# Patient Record
Sex: Male | Born: 1945 | Race: Black or African American | Hispanic: No | Marital: Married | Smoking: Never smoker
Health system: Southern US, Community
[De-identification: ages and names within clinical notes are randomized; demographics above are authoritative.]

## PROBLEM LIST (undated history)

## (undated) DIAGNOSIS — F439 Reaction to severe stress, unspecified: Secondary | ICD-10-CM

## (undated) DIAGNOSIS — H269 Unspecified cataract: Secondary | ICD-10-CM

## (undated) DIAGNOSIS — R252 Cramp and spasm: Secondary | ICD-10-CM

## (undated) DIAGNOSIS — K219 Gastro-esophageal reflux disease without esophagitis: Secondary | ICD-10-CM

## (undated) DIAGNOSIS — R0602 Shortness of breath: Secondary | ICD-10-CM

## (undated) DIAGNOSIS — F329 Major depressive disorder, single episode, unspecified: Secondary | ICD-10-CM

## (undated) DIAGNOSIS — H9190 Unspecified hearing loss, unspecified ear: Secondary | ICD-10-CM

## (undated) DIAGNOSIS — F32A Depression, unspecified: Secondary | ICD-10-CM

## (undated) DIAGNOSIS — R0981 Nasal congestion: Secondary | ICD-10-CM

## (undated) DIAGNOSIS — R110 Nausea: Secondary | ICD-10-CM

## (undated) DIAGNOSIS — E785 Hyperlipidemia, unspecified: Secondary | ICD-10-CM

## (undated) DIAGNOSIS — R531 Weakness: Secondary | ICD-10-CM

## (undated) DIAGNOSIS — R350 Frequency of micturition: Secondary | ICD-10-CM

## (undated) DIAGNOSIS — K5900 Constipation, unspecified: Secondary | ICD-10-CM

## (undated) DIAGNOSIS — F319 Bipolar disorder, unspecified: Secondary | ICD-10-CM

## (undated) DIAGNOSIS — Z87442 Personal history of urinary calculi: Secondary | ICD-10-CM

## (undated) DIAGNOSIS — T7840XA Allergy, unspecified, initial encounter: Secondary | ICD-10-CM

## (undated) DIAGNOSIS — M2550 Pain in unspecified joint: Secondary | ICD-10-CM

## (undated) DIAGNOSIS — G4733 Obstructive sleep apnea (adult) (pediatric): Secondary | ICD-10-CM

## (undated) DIAGNOSIS — F419 Anxiety disorder, unspecified: Secondary | ICD-10-CM

## (undated) DIAGNOSIS — M7989 Other specified soft tissue disorders: Secondary | ICD-10-CM

## (undated) DIAGNOSIS — M436 Torticollis: Secondary | ICD-10-CM

## (undated) DIAGNOSIS — R202 Paresthesia of skin: Secondary | ICD-10-CM

## (undated) DIAGNOSIS — E119 Type 2 diabetes mellitus without complications: Secondary | ICD-10-CM

## (undated) DIAGNOSIS — G479 Sleep disorder, unspecified: Secondary | ICD-10-CM

## (undated) DIAGNOSIS — G4730 Sleep apnea, unspecified: Secondary | ICD-10-CM

## (undated) DIAGNOSIS — E739 Lactose intolerance, unspecified: Secondary | ICD-10-CM

## (undated) DIAGNOSIS — K1379 Other lesions of oral mucosa: Secondary | ICD-10-CM

## (undated) DIAGNOSIS — R631 Polydipsia: Secondary | ICD-10-CM

## (undated) DIAGNOSIS — I82409 Acute embolism and thrombosis of unspecified deep veins of unspecified lower extremity: Secondary | ICD-10-CM

## (undated) DIAGNOSIS — H9319 Tinnitus, unspecified ear: Secondary | ICD-10-CM

## (undated) DIAGNOSIS — R5383 Other fatigue: Secondary | ICD-10-CM

## (undated) DIAGNOSIS — G8929 Other chronic pain: Secondary | ICD-10-CM

## (undated) DIAGNOSIS — Z973 Presence of spectacles and contact lenses: Secondary | ICD-10-CM

## (undated) DIAGNOSIS — F988 Other specified behavioral and emotional disorders with onset usually occurring in childhood and adolescence: Secondary | ICD-10-CM

## (undated) DIAGNOSIS — H5710 Ocular pain, unspecified eye: Secondary | ICD-10-CM

## (undated) DIAGNOSIS — I10 Essential (primary) hypertension: Secondary | ICD-10-CM

## (undated) DIAGNOSIS — R200 Anesthesia of skin: Secondary | ICD-10-CM

## (undated) DIAGNOSIS — J3489 Other specified disorders of nose and nasal sinuses: Secondary | ICD-10-CM

## (undated) DIAGNOSIS — L853 Xerosis cutis: Secondary | ICD-10-CM

## (undated) DIAGNOSIS — L299 Pruritus, unspecified: Secondary | ICD-10-CM

## (undated) DIAGNOSIS — R0600 Dyspnea, unspecified: Secondary | ICD-10-CM

## (undated) DIAGNOSIS — R632 Polyphagia: Secondary | ICD-10-CM

## (undated) DIAGNOSIS — M549 Dorsalgia, unspecified: Secondary | ICD-10-CM

## (undated) DIAGNOSIS — R1310 Dysphagia, unspecified: Secondary | ICD-10-CM

## (undated) DIAGNOSIS — H539 Unspecified visual disturbance: Secondary | ICD-10-CM

## (undated) DIAGNOSIS — H9210 Otorrhea, unspecified ear: Secondary | ICD-10-CM

## (undated) HISTORY — DX: Polyphagia: R63.2

## (undated) HISTORY — DX: Torticollis: M43.6

## (undated) HISTORY — DX: Other specified soft tissue disorders: M79.89

## (undated) HISTORY — DX: Pruritus, unspecified: L29.9

## (undated) HISTORY — DX: Other specified disorders of nose and nasal sinuses: J34.89

## (undated) HISTORY — DX: Hyperlipidemia, unspecified: E78.5

## (undated) HISTORY — DX: Essential (primary) hypertension: I10

## (undated) HISTORY — DX: Xerosis cutis: L85.3

## (undated) HISTORY — DX: Reaction to severe stress, unspecified: F43.9

## (undated) HISTORY — DX: Weakness: R53.1

## (undated) HISTORY — DX: Other specified behavioral and emotional disorders with onset usually occurring in childhood and adolescence: F98.8

## (undated) HISTORY — DX: Dorsalgia, unspecified: M54.9

## (undated) HISTORY — PX: ESOPHAGOGASTRODUODENOSCOPY: SHX1529

## (undated) HISTORY — PX: OTHER SURGICAL HISTORY: SHX169

## (undated) HISTORY — DX: Dysphagia, unspecified: R13.10

## (undated) HISTORY — DX: Bipolar disorder, unspecified: F31.9

## (undated) HISTORY — DX: Unspecified hearing loss, unspecified ear: H91.90

## (undated) HISTORY — DX: Allergy, unspecified, initial encounter: T78.40XA

## (undated) HISTORY — DX: Sleep disorder, unspecified: G47.9

## (undated) HISTORY — DX: Gastro-esophageal reflux disease without esophagitis: K21.9

## (undated) HISTORY — DX: Other lesions of oral mucosa: K13.79

## (undated) HISTORY — DX: Lactose intolerance, unspecified: E73.9

## (undated) HISTORY — DX: Unspecified cataract: H26.9

## (undated) HISTORY — PX: EYE SURGERY: SHX253

## (undated) HISTORY — DX: Obstructive sleep apnea (adult) (pediatric): G47.33

## (undated) HISTORY — DX: Otorrhea, unspecified ear: H92.10

## (undated) HISTORY — DX: Nasal congestion: R09.81

## (undated) HISTORY — DX: Sleep apnea, unspecified: G47.30

## (undated) HISTORY — DX: Unspecified visual disturbance: H53.9

## (undated) HISTORY — DX: Acute embolism and thrombosis of unspecified deep veins of unspecified lower extremity: I82.409

## (undated) HISTORY — DX: Frequency of micturition: R35.0

## (undated) HISTORY — DX: Polydipsia: R63.1

## (undated) HISTORY — DX: Ocular pain, unspecified eye: H57.10

## (undated) HISTORY — DX: Shortness of breath: R06.02

## (undated) HISTORY — DX: Other fatigue: R53.83

## (undated) HISTORY — DX: Cramp and spasm: R25.2

## (undated) HISTORY — DX: Pain in unspecified joint: M25.50

## (undated) HISTORY — DX: Nausea: R11.0

## (undated) HISTORY — PX: COLONOSCOPY: SHX174

## (undated) HISTORY — DX: Constipation, unspecified: K59.00

## (undated) HISTORY — DX: Tinnitus, unspecified ear: H93.19

---

## 1997-07-26 ENCOUNTER — Encounter: Admission: RE | Admit: 1997-07-26 | Discharge: 1997-10-24 | Payer: Self-pay | Admitting: Emergency Medicine

## 1997-10-15 ENCOUNTER — Ambulatory Visit: Admission: RE | Admit: 1997-10-15 | Discharge: 1997-10-15 | Payer: Self-pay | Admitting: Otolaryngology

## 1998-02-11 ENCOUNTER — Ambulatory Visit: Admission: RE | Admit: 1998-02-11 | Discharge: 1998-02-11 | Payer: Self-pay

## 1998-10-09 ENCOUNTER — Ambulatory Visit (HOSPITAL_COMMUNITY): Admission: RE | Admit: 1998-10-09 | Discharge: 1998-10-09 | Payer: Self-pay | Admitting: Emergency Medicine

## 1998-10-10 ENCOUNTER — Encounter: Payer: Self-pay | Admitting: Emergency Medicine

## 1999-08-17 ENCOUNTER — Ambulatory Visit (HOSPITAL_COMMUNITY): Admission: RE | Admit: 1999-08-17 | Discharge: 1999-08-17 | Payer: Self-pay | Admitting: Emergency Medicine

## 1999-08-17 ENCOUNTER — Encounter: Payer: Self-pay | Admitting: Emergency Medicine

## 2000-08-29 ENCOUNTER — Encounter: Payer: Self-pay | Admitting: Emergency Medicine

## 2000-08-29 ENCOUNTER — Encounter: Admission: RE | Admit: 2000-08-29 | Discharge: 2000-08-29 | Payer: Self-pay | Admitting: Emergency Medicine

## 2000-09-12 ENCOUNTER — Encounter: Payer: Self-pay | Admitting: Emergency Medicine

## 2000-09-12 ENCOUNTER — Encounter: Admission: RE | Admit: 2000-09-12 | Discharge: 2000-09-12 | Payer: Self-pay | Admitting: Emergency Medicine

## 2000-12-30 ENCOUNTER — Encounter: Payer: Self-pay | Admitting: Emergency Medicine

## 2000-12-30 ENCOUNTER — Encounter: Admission: RE | Admit: 2000-12-30 | Discharge: 2000-12-30 | Payer: Self-pay | Admitting: Emergency Medicine

## 2001-11-06 ENCOUNTER — Encounter: Admission: RE | Admit: 2001-11-06 | Discharge: 2001-11-06 | Payer: Self-pay | Admitting: Emergency Medicine

## 2001-11-06 ENCOUNTER — Encounter: Payer: Self-pay | Admitting: Emergency Medicine

## 2002-03-29 DIAGNOSIS — I82409 Acute embolism and thrombosis of unspecified deep veins of unspecified lower extremity: Secondary | ICD-10-CM

## 2002-03-29 HISTORY — DX: Acute embolism and thrombosis of unspecified deep veins of unspecified lower extremity: I82.409

## 2002-03-29 HISTORY — PX: BACK SURGERY: SHX140

## 2002-04-07 ENCOUNTER — Encounter: Admission: RE | Admit: 2002-04-07 | Discharge: 2002-04-07 | Payer: Self-pay | Admitting: Emergency Medicine

## 2002-04-07 ENCOUNTER — Encounter: Payer: Self-pay | Admitting: Emergency Medicine

## 2003-01-07 ENCOUNTER — Encounter: Payer: Self-pay | Admitting: Orthopaedic Surgery

## 2003-01-10 ENCOUNTER — Ambulatory Visit (HOSPITAL_COMMUNITY): Admission: RE | Admit: 2003-01-10 | Discharge: 2003-01-11 | Payer: Self-pay | Admitting: Orthopaedic Surgery

## 2003-01-10 ENCOUNTER — Encounter: Payer: Self-pay | Admitting: Orthopaedic Surgery

## 2003-03-13 ENCOUNTER — Encounter: Admission: RE | Admit: 2003-03-13 | Discharge: 2003-03-13 | Payer: Self-pay | Admitting: Emergency Medicine

## 2003-03-13 ENCOUNTER — Observation Stay (HOSPITAL_COMMUNITY): Admission: AD | Admit: 2003-03-13 | Discharge: 2003-03-14 | Payer: Self-pay | Admitting: Internal Medicine

## 2003-11-14 ENCOUNTER — Encounter: Admission: RE | Admit: 2003-11-14 | Discharge: 2003-11-14 | Payer: Self-pay | Admitting: Emergency Medicine

## 2004-06-30 ENCOUNTER — Emergency Department (HOSPITAL_COMMUNITY): Admission: EM | Admit: 2004-06-30 | Discharge: 2004-07-01 | Payer: Self-pay | Admitting: Emergency Medicine

## 2004-07-16 ENCOUNTER — Encounter: Admission: RE | Admit: 2004-07-16 | Discharge: 2004-09-02 | Payer: Self-pay | Admitting: Family Medicine

## 2004-07-28 ENCOUNTER — Encounter: Admission: RE | Admit: 2004-07-28 | Discharge: 2004-07-28 | Payer: Self-pay | Admitting: Family Medicine

## 2007-11-21 ENCOUNTER — Ambulatory Visit: Payer: Self-pay | Admitting: Internal Medicine

## 2007-11-21 LAB — CONVERTED CEMR LAB
ALT: 23 units/L (ref 0–53)
AST: 13 units/L (ref 0–37)
Albumin: 4 g/dL (ref 3.5–5.2)
Alkaline Phosphatase: 105 units/L (ref 39–117)
BUN: 19 mg/dL (ref 6–23)
CO2: 22 meq/L (ref 19–32)
Calcium: 8.9 mg/dL (ref 8.4–10.5)
Chloride: 107 meq/L (ref 96–112)
Creatinine, Ser: 1.22 mg/dL (ref 0.40–1.50)
Glucose, Bld: 194 mg/dL — ABNORMAL HIGH (ref 70–99)
Potassium: 3.6 meq/L (ref 3.5–5.3)
Sodium: 140 meq/L (ref 135–145)
TSH: 0.221 microintl units/mL — ABNORMAL LOW (ref 0.350–4.50)
Total Bilirubin: 0.4 mg/dL (ref 0.3–1.2)
Total Protein: 6.5 g/dL (ref 6.0–8.3)

## 2007-11-29 ENCOUNTER — Ambulatory Visit: Payer: Self-pay | Admitting: *Deleted

## 2007-12-13 ENCOUNTER — Emergency Department (HOSPITAL_COMMUNITY): Admission: EM | Admit: 2007-12-13 | Discharge: 2007-12-13 | Payer: Self-pay | Admitting: Family Medicine

## 2007-12-20 ENCOUNTER — Ambulatory Visit: Payer: Self-pay | Admitting: Internal Medicine

## 2007-12-20 LAB — CONVERTED CEMR LAB
Free T4: 1.2 ng/dL (ref 0.89–1.80)
TSH: 0.375 microintl units/mL (ref 0.350–4.50)

## 2008-01-09 ENCOUNTER — Ambulatory Visit: Payer: Self-pay | Admitting: Internal Medicine

## 2008-01-09 LAB — CONVERTED CEMR LAB
ALT: 25 units/L (ref 0–53)
AST: 15 units/L (ref 0–37)
Albumin: 4.1 g/dL (ref 3.5–5.2)
Alkaline Phosphatase: 114 units/L (ref 39–117)
BUN: 15 mg/dL (ref 6–23)
CO2: 20 meq/L (ref 19–32)
Calcium: 9.4 mg/dL (ref 8.4–10.5)
Chloride: 108 meq/L (ref 96–112)
Cholesterol: 207 mg/dL — ABNORMAL HIGH (ref 0–200)
Creatinine, Ser: 0.95 mg/dL (ref 0.40–1.50)
Glucose, Bld: 169 mg/dL — ABNORMAL HIGH (ref 70–99)
HDL: 54 mg/dL (ref >39)
LDL Cholesterol: 124 mg/dL — ABNORMAL HIGH (ref 0–99)
Microalb, Ur: 0.2 mg/dL (ref 0.00–1.89)
Potassium: 4.1 meq/L (ref 3.5–5.3)
Sodium: 140 meq/L (ref 135–145)
TSH: 0.106 microintl units/mL — ABNORMAL LOW (ref 0.350–4.50)
Total Bilirubin: 0.3 mg/dL (ref 0.3–1.2)
Total CHOL/HDL Ratio: 3.8
Total Protein: 6.8 g/dL (ref 6.0–8.3)
Triglycerides: 146 mg/dL (ref <150)
VLDL: 29 mg/dL (ref 0–40)

## 2008-01-10 ENCOUNTER — Ambulatory Visit: Payer: Self-pay | Admitting: Internal Medicine

## 2008-02-15 ENCOUNTER — Ambulatory Visit: Payer: Self-pay | Admitting: Internal Medicine

## 2008-02-15 LAB — CONVERTED CEMR LAB
Free T4: 1.01 ng/dL (ref 0.89–1.80)
T3, Total: 119.2 ng/dL (ref 80.0–204.0)
TSH: 0.499 microintl units/mL (ref 0.350–4.50)

## 2008-04-02 ENCOUNTER — Ambulatory Visit: Payer: Self-pay | Admitting: Internal Medicine

## 2008-04-09 ENCOUNTER — Ambulatory Visit (HOSPITAL_BASED_OUTPATIENT_CLINIC_OR_DEPARTMENT_OTHER): Admission: RE | Admit: 2008-04-09 | Discharge: 2008-04-09 | Payer: Self-pay | Admitting: Internal Medicine

## 2008-04-13 ENCOUNTER — Ambulatory Visit: Payer: Self-pay | Admitting: Internal Medicine

## 2008-04-16 ENCOUNTER — Ambulatory Visit: Payer: Self-pay | Admitting: Internal Medicine

## 2008-04-16 LAB — CONVERTED CEMR LAB
BUN: 18 mg/dL (ref 6–23)
CO2: 27 meq/L (ref 19–32)
Calcium: 9.5 mg/dL (ref 8.4–10.5)
Chloride: 105 meq/L (ref 96–112)
Creatinine, Ser: 1.08 mg/dL (ref 0.40–1.50)
Glucose, Bld: 108 mg/dL — ABNORMAL HIGH (ref 70–99)
Potassium: 3.8 meq/L (ref 3.5–5.3)
Sodium: 140 meq/L (ref 135–145)

## 2010-04-18 ENCOUNTER — Encounter: Payer: Self-pay | Admitting: Emergency Medicine

## 2010-08-11 NOTE — Procedures (Signed)
Max [name], Max [name]            ACCOUNT NO.:  [id number]   MEDICAL RECORD NO.:  [id number]          PATIENT TYPE:  OUT   LOCATION:  SLEEP CENTER                 FACILITY:  [facility]   PHYSICIAN:  Clinton D. Maple Hudson, MD, FCCP, FACPDATE OF BIRTH:  10-23-47   DATE OF STUDY:  04/09/2008                            NOCTURNAL POLYSOMNOGRAM   REFERRING PHYSICIAN:  Dineen Kid. Reche Dixon, M.D.   REFERRING PHYSICIAN:  Dineen Kid. Reche Dixon, MD.   INDICATION FOR STUDY:  Hypersomnia with sleep apnea.   EPWORTH SLEEPINESS SCORE:  Epworth sleepiness score 16/24.  BMI 38.6.  Weight 277 pounds.  Height 71 inches.  Neck 17 inches.   MEDICATIONS:  Home medications are charted and reviewed.  The patient  was diagnosed with sleep apnea in 1999 and has been using CPAP.  The  original study is no longer available and home pressure is unknown.  A  split protocol study was requested.   SLEEP ARCHITECTURE:  Total sleep time 388 minutes with sleep efficiency  84.5%.  Stage I is 10.3%.  Stage II 84.6%.  Stage III absent.  REM 5.1%  of total sleep time.  Sleep latency 9.5 minutes.  REM latency 381  minutes.  Awake after sleep onset 62 minutes.  Arousal index 37.4.  Bedtime medication:  The patient stated he took 2 clonazepam, 2 Lunesta,  and Flexeril at 1845 p.m. due to his concern trying to sleep without his  CPAP.   RESPIRATORY DATA:  Apnea-hypopnea index (AHI) 12.8 per hour.  A total of  83 events were scored including 9 obstructive apneas and 74 hypopneas.  Events were not positional.  REM AHI 78 per hour.  There were  insufficient events on the study night to permit CPAP titration by split  protocol which requires initial demonstration of sleep apnea syndrome  and then titration of CPAP only if there is sufficient remaining time.   OXYGEN DATA:  Moderate to occasionally very loud snoring with oxygen  desaturation to a nadir of 87%.  Mean oxygen saturation on room air was  92.7% through the study.   CARDIAC  DATA:  Sinus rhythm with occasional PVC.   MOVEMENT/PARASOMNIA:  No significant movement disturbance.  No bathroom  trips.   IMPRESSIONS/RECOMMENDATIONS:  1. Sleep architecture was significant for frequent brief spontaneous      wakings and reduced rapid eye movement.  No significant medication      taken before sleep as described above.  2. Mild obstructive sleep apnea/hypopnea syndrome, AHI 12.8 per hour      with nonpositional events.  Moderate to occasionally loud snoring      and oxygen desaturation to a nadir of 87%.  3. There were insufficient early events on the study night to permit      CPAP titration by split protocol.  He has experience with CPAP and      considers it comfortable.  He can return to the sleep center for a      formal CPAP titration if necessary.  Consider ordering an      autotitration pressure check through the home care company as an      alternative, now that persistence  of obstructive sleep apnea is      confirmed for diagnostic purposes.      Clinton D. Maple Hudson, MD, [facility], FACP  Diplomate, Biomedical engineer of Sleep Medicine  Electronically Signed     CDY/MEDQ  D:  04/13/2008 11:08:55  T:  04/14/2008 02:04:55  Job:  [id number]

## 2010-08-14 NOTE — H&P (Signed)
NAME:  Max Max [name], Max Max [name]                      ACCOUNT NO.:  [id number]   MEDICAL RECORD NO.:  [id number]                   PATIENT TYPE:  INP   LOCATION:  5731                                 FACILITY:  [facility]   PHYSICIAN:  Max Max [name], M.D.                DATE OF BIRTH:  Feb 08, 1946   DATE OF ADMISSION:  03/13/2003  DATE OF DISCHARGE:                                HISTORY & PHYSICAL   PRIMARY CARE PHYSICIAN:  Max Max [name], M.D.   CHIEF COMPLAINT:  Right lower-extremity deep vein thrombosis.   HISTORY OF PRESENT ILLNESS:  The patient is Max [name] very-pleasant 65 year old  African American male who was in his usual state of health when he felt  right lower extremity pain which was mild at first on March 07, 2003.  The  pain and Max [name] mild degree of swelling continued.  On March 09, 2003, it  became unbearable.  The patient saw his primary-care physician who promptly  ordered Max [name] lower extremity Doppler ultrasound of his leg which apparently  came back positive for deep vein thrombosis today.  Currently there is not Max [name]  copy of the report.  The patient denies any other symptoms including  pruritic or chest pain, shortness of breath, orthopnea. PND.   ALLERGIES:  No known drug allergies.   MEDICATIONS:  1. Metformin 1000 mg p.o. b.i.d.  2. Lisinopril 40 mg p.o. daily.  3. Hydrochlorothiazide 25 mg p.o. daily.  4. Glipizide ER, 20 mg p.o. daily.  5. __________ 8 mg p.o. daily.  6. Insulin glargine 20 units subcutaneously at bedtime.  7. Valproate 1500 mg p.o. at bedtime.  8. Meloxicam 7.5 mg p.o. p.r.n.   PAST MEDICAL HISTORY:  1. Diabetes mellitus type 2.  2. Hypertension.  3. Chronic low back pain.  4. Obesity.   PAST SURGICAL HISTORY:  1. Right rotator cuff repair.  2. Right upper extremity open reduction, internal fixation 1971.  3. L4-L5 lateral recess decompression in 2004.   FAMILY HISTORY:  Mother alive at age 4 with diabetes.  Father deceased at  age 10 who was  murdered.  The patient has nine brothers and sisters, two of  which have diabetes.  The patient denied any knowledge of family members  with thrombophilia or coagulation disorder.   SOCIAL HISTORY:  The patient admits to approximately Max [name] three-pack-year  smoking history.  Denies alcohol or illicit or IV drug use.  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   REVIEW OF SYSTEMS:  The patient admits to lower extremity pain, back pain  and nausea.  He denies headache, visual acuity changes, diplopia,  scintillation scatoma or other changes of vertigo, epistaxis, oral lesions  or ulcers, dysphagia, or odynophagia, neck pain or neck stiffness, shortness  of breath at rest or upon exertion, orthopnea, PND, cough, hemoptysis, chest  pain at rest or  upon exertion, diarrhea, red blood per rectum, melena,  hematemesis, coffee ground emesis, hematuria, dysuria, polyuria, myalgias,  previous chills, sweats.  All other systems are negative.   PHYSICAL EXAMINATION:  VITAL SIGNS:  Temperature 97.6, pulse 99,  respirations 18, blood pressure 170/102. Weight 236.2 pounds.  GENERAL:  Pleasant, in no apparent distress.  Speaking in full sentences.  HEENT:  Head normocephalic, atraumatic without alopecia.  Eyes:  Pupils  equal, round and reactive to light.  Extraocular muscles are intact and  icteric with muddy sclerae.  No ejection, no discharge.  Normal appearing  conjunctivae.  Arcus senilis noted.  Nose:  No dried blood in the nares.  Mouth:  Moist mucous membranes.  Uvula is midline.  Oropharynx without  erythema or exudate.  NECK:  Supple without lymphadenopathy, thyromegaly, bruits or JVD.  There  are no meningeal signs.  LUNGS:  Clear to auscultation and percussion bilaterally without rales,  rhonchi or wheezes.  HEART:  Regular rate and rhythm, S1, S2.  ABDOMEN:  Morbidly obese, nondistended. Bowel sounds are present.  No  guarding or  rebound.  No hepatosplenomegaly.  EXTREMITIES:  No clubbing or cyanosis.  Right lower extremity measures 40 cm  and the left lower extremity measures 38 cm.  Tenseness below the tibial  tuberosity.   ASSESSMENT/PLAN:  This is Max [name] 65 year old African American male with diabetes  and hypertension with unprovoked deep vein thrombosis.   Problem #1.  Neuropsychiatric.  No active issues.  Problem #2.  Pulmonary.  There is nothing to suggest pulmonary embolism.  We  will monitor the patient's saturation and respiratory status otherwise.  Problem #3.  Cardiovascular:  The patient's initial blood pressure was  elevated.  Most likely the patient has not taken his medications.  We will  follow over the course of admission.  Problem #4.  Renal.  No active issues.  Problem #5.  Gastrointestinal:  Will check liver function tests.  The  patient warrants Max [name] fasting lipid panel checked.  Problem #5.  Fluids, electrolytes and nutrition.  We will start the patient  on Max [name] 2200 kilocalorie ADA diet.  Problem #6.  Infectious Disease.  No active issues.  Problem #7.  Hematology/Oncology.  Although there is no insight of cause of  the patient's first deep vein thrombosis, at this time, do not feel  compelled to do Max [name] thrombophilia workup or hunt for neoplastic disease. The  patient will be started on 1 mcg/kg of heparin subcutaneously and tomorrow  will be started on warfarin as an outpatient.   PLAN:  1. We will case management arrange for home and __________. We will have the     nurses teach him how to administer subcutaneously dosing.  2. Endocrine.  We will Accu-Chek on the patient q.Max [name].c. and at bedtime and     start him on his regular insulin and oral hypoglycemics and start him on     sliding scale insulin.  3. Prophylaxis.  The patient will be on low molecular weight heparin for     deep vein thrombosis treatment. 4. He will be full p.o. for __________ prophylaxis.   DISPOSITION:  The patient is Max [name]  full code.                                                Max Max [name], M.D.  ADM/MEDQ  D:  03/13/2003  T:  03/13/2003  Job:  [id number]   cc:   Max Max [name], M.D.  [address]  [place]  [place] [address]  Fax: [phone]

## 2010-08-14 NOTE — Op Note (Signed)
NAME:  Max [name], Max [name]                      ACCOUNT NO.:  [id number]   MEDICAL RECORD NO.:  [id number]                   PATIENT TYPE:  OIB   LOCATION:  2874                                 FACILITY:  [facility]   PHYSICIAN:  Sharolyn Douglas, M.D.                     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   PROCEDURE:  Right L4-5 hemilaminotomy and partial facetectomy with lateral  recess decompression.   SURGEON:  Sharolyn Douglas, M.D.   ASSISTANT:  Verlin Fester, P.A.   ANESTHESIA:  General endotracheal.   COMPLICATIONS:  None.   INDICATIONS:  The patient is a 65 year old male with chronic persistent  right-sided back and buttock and thigh pain.  His plain radiographs show  degenerative changes at L4-5 and L5-S1.  MRI scan shows moderate central  stenosis at L4-5 secondary to posterior element hypertrophy and bulging of  the disk.  There is lateral recess stenosis on the right.  L5-S1, he again  has facet hypertrophy with some degree of foraminal stenosis at the L5-S1  level on the right.  There was a shallow disk protrusion at L5-S1 that  appears to just touch the right S1 nerve root but does not displace it.  He  had a long course of conservative treatment including a right L5 selective  nerve root injection which gave him significant improvement in his pain for  short periods of time.  Because he has been refractory to all conservative  modalities, he has elected to undergo a right L4-5 lateral recess  decompression in hopes of improving his symptoms.  He understands that he  may continue to have persistent symptoms, either related to continued  degeneration at L4-5 as well as the adjacent L5-S1 level.  The risks,  benefits, and alternatives were extensively discussed.  The patient elected  to proceed.   PROCEDURE:  The patient was properly  identified in the holding area, taken  to the operating room.  He underwent general endotracheal anesthesia without  difficulty.  He was given prophylactic IV antibiotics.  He was carefully  turned prone onto the Wilson frame.  All bony prominences were padded.  Face  and eyes protected at all times.  The back was prepped and draped in the  usual sterile fashion.  We brought in fluoroscopy.  A spinal needle was used  to localize the L4-5 interspace.  We made a 1.6 cm incision 2 cm lateral to  the midline on the right side directly over the L4-5 interspace.  The deep  fascia was incised.  We then used a series of dilators to spread the  tissues, docking on the L4-5 lamina facet junction.  This was done using  lateral fluoroscopy.  We then placed the Maxcess retractor with  the 80 mm  blades.  The retractor was attached to the table.  The retractor was  expanded.  We then removed a small amount of residual muscle over the L4-5  interspace and lamina.  We brought in the surgical microscope.  We performed  a small hemilaminotomy and medial one-third facetectomy at L4-5 using a high  speed bur and Kerrison punches.  The ligament flavum was elevated.  We  identified the L5 nerve root.  It appeared to be compressed within the  lateral recess between the bulging disk and overgrown posterior element.  We  debrided back the superior facet flush with the L5 pedicle.  When we were  done, the L5 nerve root was completely free from its take-off out the L5-S1  foramen, which was palpated with a blunt probe.  We gently retracted the  root and evaluated the L4-5 disk.  Although there was a bulging component,  it was not felt that entering the disk space would add anything in the way  of a decompression as the root was clearly free.  All bleeding was  controlled with bipolar electrocautery and Gelfoam.  Two mL of Fentanyl were  left in the epidural space for postoperative anesthesia.  Gelfoam was left  over  the exposed epidural space.   The deep fascia closed with a single #1 Vicryl suture.  Subcutaneous layer  closed with 2-0 Vicryl followed by Dermabond to approximate the skin.  The  patient was turned supine and extubated without difficulty.  He was  transferred to the recovery room in stable condition, able to move his upper  and lower extremities.                                               Sharolyn Douglas, M.D.    MC/MEDQ  D:  01/10/2003  T:  01/11/2003  Job:  [id number]

## 2010-08-14 NOTE — H&P (Signed)
NAME:  Max [name], Max [name]                      ACCOUNT NO.:  [id number]   MEDICAL RECORD NO.:  [id number]                   PATIENT TYPE:  OIB   LOCATION:  5039                                 FACILITY:  [facility]   PHYSICIAN:  Sharolyn Douglas, M.D.                     DATE OF BIRTH:  03/25/1946   DATE OF ADMISSION:  01/10/2003  DATE OF DISCHARGE:  01/11/2003                                HISTORY & PHYSICAL   CHIEF COMPLAINT:  Back and right lower extremity pain.   HISTORY OF PRESENT ILLNESS:  The patient is a 65 year old male with back and  right lower extremity pain that has failed conservative treatment, including  epidural steroid injections.  He has also tried anti-inflammatory  medications, pain medications, activity modification, and physical therapy.  Unfortunately, he continues to have severe back pain and right lower  extremity pain that is affecting his ability to work and go about his  activities of daily living.  Risks and benefits of the proposed surgery were  discussed with the patient by Sharolyn Douglas, M.D. as well as myself.  He  indicated understanding and opted to proceed.   ALLERGIES:  No known drug allergies.   MEDICATIONS:  1. Darvocet p.r.n.  2. Flexeril p.r.n.  3. Lisinopril daily.  4. Metformin daily.  5. Hydrochlorothiazide daily.  6. Depakote daily.  7. Avandia daily.  8. Glipizide daily.  9. The patient does not have his medication doses with him today and will     bring them to the hospital.   PAST MEDICAL HISTORY:  1. Hypertension.  2. Diabetes.   PAST SURGICAL HISTORY:  Right shoulder surgery, rotator cuff repair.   SOCIAL HISTORY:  The patient smoke 6 cigarettes per day.  He denies alcohol  use.  He is married.  He has three grown children.  His wife will be  available to help postoperatively.  She works full-time, so he will be by  himself during the day.   FAMILY MEDICAL HISTORY:  Significant for hypertension, diabetes, and breast  cancer.   REVIEW OF SYSTEMS:  GENERAL:  The patient denies fevers, chills, sweats, or  bleeding tendencies.  CNS:  Denies blurred vision, double vision, seizures,  headache, paralysis.  CARDIOVASCULAR:  Denies chest pain, angina, orthopnea,  claudication, or palpitations.  PULMONARY:  No shortness of breath,  productive cough, or hemoptysis.  GI:  Denies nausea, vomiting,  constipation, diarrhea, melena,or bloody stools.  GU:  Denies dysuria,  hematuria, or discharge.  MUSCULOSKELETAL:  As per HPI.   PHYSICAL EXAMINATION:  VITAL SIGNS:  Stable.  GENERAL APPEARANCE:  The patient is a 65 year old black male, who is alert  and oriented and in no acute distress.  He is well-nourished, well-groomed,  appears stated age, pleasant and cooperative to exam.  HEENT:  Head is normocephalic, atraumatic.  Pupils are equal, round and  reactive.  Extraocular movements intact, nares  patent, oropharynx is clear.  NECK:  Supple to palpation.  No lymphadenopathy, thyromegaly, or bruits  appreciated.  CHEST:  Clear to auscultation bilaterally.  No rales, rhonchi, stridor, or  wheezing.  BREASTS:  Not pertinent, not performed.  HEART:  S1, S2.  Regular rate and rhythm.  No murmurs, gallops, or rubs  noted.  ABDOMEN:  Soft to palpation.  Nontender, distended.  No organomegaly noted.  GU:  Not pertinent, not performed.  EXTREMITIES:  The patient has right lower extremity pain and back pain.  SKIN:  Intact without any lesions or rashes.   MRI shows L4-5 right-sided lateral recess stenosis.   IMPRESSION:  1. L4-5 lateral recess stenosis.  2. Hypertension.  3. Diabetes.  4. Anxiety.   PLAN:  1. Admit to [facility] on January 10, 2003, for an L4-5 right     lateral recess decompression.  This will be done by Sharolyn Douglas, M.D.  2. The patient's primary care physician is Reuben Likes, M.D.      Verlin Fester, P.A.                       Sharolyn Douglas, M.D.    CM/MEDQ  D:  01/18/2003  T:  01/18/2003  Job:   [id number]

## 2010-08-14 NOTE — Discharge Summary (Signed)
NAME:  Max [name], Max [name]                      ACCOUNT NO.:  [id number]   MEDICAL RECORD NO.:  [id number]                   PATIENT TYPE:  INP   LOCATION:  5731                                 FACILITY:  [facility]   PHYSICIAN:  Ara D. Tammi Klippel, M.D.                DATE OF BIRTH:  1945-04-08   DATE OF ADMISSION:  03/13/2003  DATE OF DISCHARGE:  03/14/2003                                 DISCHARGE SUMMARY   PRIMARY CARE PHYSICIAN:  Reuben Likes, M.D.   FINAL DIAGNOSES:  1. Right lower extremity deep venous thrombosis.  2. Diabetes mellitus type 2.  3. Hypertension.  4. Chronic low back pain.  5. Obesity.  6. Status post right rotator cuff repair.  7. Right upper extremity open reduction, internal fixation 1971.  8. Status post L4-L5 lateral recess decompression, October 2004.   PROCEDURES:  1. Warfarin education.  2. Enoxaparin administration and education.   CLINICAL DATA:  Pertinent labs and other test results include white blood  cells 9.2, H/H of 13.0 and 38.5.  Platelet count 225,000.  Pro Time 12.7,  INR 0.9, PTT 32.  Total bilirubin 0.3.  Direct bilirubin less than 0.1.  Alkaline phosphatase 117.  AST 16, ALT 18, total protein 6.3, albumin 3.0,  calcium 8.7, hemoglobin A1C 11.8.  TSH 0.367.  Occult fecal blood negative.  Lipid panel fasting, still pending.   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  The patient was taught how to administer enoxaparin subcutaneously and was  given Warfarin instructions from the pharmacy.  Of note, the patient has  revealed [name] fair amount of  frustration and despair over his disability and  his inability to work and he feels that the stress of being disabled and his  low back pain and subsequent interventions upon them are what may have led  to his deep venous thrombosis.  Otherwise, there were no other problems  during his admission.   DISCHARGE MEDICATIONS:  1. Warfarin 5 mg p.o. daily.  2. Enoxaparin 120 mg subcutaneously b.i.d.  3. Metformin 1000 mg p.o. b.i.d.  4. Glipizide-ER 20 mg p.o. daily.  5. Hydrochlorothiazide 25 mg p.o. daily.  6. Rosiglitazone 8 mg p.o. daily.  7. Insulin glargine 20 units subcutaneously q.h.s.  8. Valproate 1,500 mg p.o. q.h.s.  9. Meloxicam 7.5 mg p.o. PRN daily.  10.      Lisinopril 40 mg p.o. daily.   DISCHARGE INSTRUCTIONS:  1. The patient is to have an ADA diet.  2. He is to  continue with his exercise.  3. He is to take his medications as prescribed.  4. He is to follow up with Dr. Lorenz Coaster on Monday, March 18, 2003 to have     his Pro Time and INR checked.  5. He is to return if he feels worse.                                                Ara D. Tammi Klippel, M.D.    ADM/MEDQ  D:  03/14/2003  T:  03/15/2003  Job:  [id number]   cc:   Reuben Likes, M.D.  [address]  [place]  [place] [address]  Fax: [phone]

## 2011-12-22 ENCOUNTER — Other Ambulatory Visit: Payer: Self-pay | Admitting: Family Medicine

## 2011-12-22 ENCOUNTER — Ambulatory Visit
Admission: RE | Admit: 2011-12-22 | Discharge: 2011-12-22 | Disposition: A | Discharge status: Home / Self Care | Payer: Medicare Other | Source: Ambulatory Visit | Attending: Family Medicine | Admitting: Family Medicine

## 2011-12-22 DIAGNOSIS — R0602 Shortness of breath: Secondary | ICD-10-CM

## 2011-12-22 DIAGNOSIS — R079 Chest pain, unspecified: Secondary | ICD-10-CM

## 2012-01-14 ENCOUNTER — Ambulatory Visit (INDEPENDENT_AMBULATORY_CARE_PROVIDER_SITE_OTHER): Payer: Medicare Other | Admitting: Internal Medicine

## 2012-01-14 ENCOUNTER — Encounter: Payer: Self-pay | Admitting: Internal Medicine

## 2012-01-14 VITALS — BP 100/62 | HR 94 | Temp 98.3°F | Ht 71.0 in | Wt 280.0 lb

## 2012-01-14 DIAGNOSIS — G4733 Obstructive sleep apnea (adult) (pediatric): Secondary | ICD-10-CM | POA: Insufficient documentation

## 2012-01-14 DIAGNOSIS — G4730 Sleep apnea, unspecified: Secondary | ICD-10-CM

## 2012-01-14 NOTE — Progress Notes (Addendum)
  Subjective:    Patient ID: Max [name], male    DOB: January 07, 1946  MRN: [id number]  HPI  18 yobm "never really smoked" referred 01/14/2012 to pulmonary clinic for noct hypoxemia by Tammy Fulp   01/14/2012 1st pulmonary eval/ cc limited by back and legs and fatigue and using mulitple cns effective meds for pain control but denies being limited by breathing, does note audible wheezing "when over does it" but not aware of being sob and reproduced this in office (voluntary pseudowheeze). No obvious daytime variabilty or assoc chronic cough or cp or chest tightness, subjective wheeze overt sinus or hb symptoms. No unusual exp hx or h/o childhood pna/ asthma or premature birth to his knowledge.   Sleeping poorly on cpap due to issues with mask and settings and last sleep study 2010 by Dr Maple Hudson rec trial of autoset cpap with desats noted during that study.  Pt wakes up tired but denies excessive hypersomnolence and if anything appeared frankly hyper/ manic during interview done afternoon.  Review of Systems  Constitutional: Negative for fever, chills, activity change, appetite change and unexpected weight change.  HENT: Positive for dental problem. Negative for congestion, sore throat, rhinorrhea, sneezing, trouble swallowing, voice change and postnasal drip.   Eyes: Negative for visual disturbance.  Respiratory: Positive for shortness of breath. Negative for cough and choking.   Cardiovascular: Negative for chest pain and leg swelling.  Gastrointestinal: Negative for nausea, vomiting and abdominal pain.  Genitourinary: Negative for difficulty urinating.  Musculoskeletal: Positive for arthralgias.  Skin: Negative for rash.  Psychiatric/Behavioral: Negative for behavioral problems and confusion.       Objective:   Physical Exam  Animated amb obese bm nad with voluntary pseudowheeze  Wt Readings from Last 3 Encounters:  01/14/12 280 lb (127.007 kg)   HEENT: nl dentition, turbinates,  and orophanx/ airway adequate.  Nl external ear canals without cough reflex   NECK :  without JVD/Nodes/TM/ nl carotid upstrokes bilaterally   LUNGS: no acc muscle use, clear to A and P bilaterally without cough on insp or exp maneuvers   CV:  RRR  no s3 or murmur or increase in P2, no edema   ABD:  soft and nontender with nl excursion in the supine position. No bruits or organomegaly, bowel sounds nl  MS:  warm without deformities, calf tenderness, cyanosis or clubbing  SKIN: warm and dry without lesions    NEURO:  alert, approp, no deficits        Assessment & Plan:

## 2012-01-14 NOTE — Patient Instructions (Addendum)
We will need to set you up to see Dr Maple Hudson next available opening to discuss your previous sleep study and options to treat you longterm  In meantime we will call [facility] and see if they can switch you over to autoset-cpap as per Dr Roxy Cedar last recommendation/ mask of choice

## 2012-01-16 NOTE — Assessment & Plan Note (Signed)
-   Sleep study 04/13/08 1. Sleep architecture was significant for frequent brief spontaneous  wakings and reduced rapid eye movement. No significant medication  taken before sleep as described above.  2. Mild obstructive sleep apnea/hypopnea syndrome, AHI 12.8 per hour  with nonpositional events. Moderate to occasionally loud snoring  and oxygen desaturation to a nadir of 87%.  3. There were insufficient early events on the study night to permit  CPAP titration by split protocol. He has experience with CPAP and  considers it comfortable. He can return to the sleep center for a  formal CPAP titration if necessary. Consider ordering an  autotitration pressure check through the home care company as an  alternative, now that persistence of obstructive sleep apnea is  confirmed for diagnostic purposes.   - 01/14/2012 autoset-cpap as per Dr Roxy Cedar last recommendation/ mask of choice  No need for noct 02 until at least try the cpap auto set as rec on last sleep study before considering adding 02 .  His dependency of cns active meds may render treatment options very limited. Defer fine -tuning to Dr Maple Hudson, nothing else to offer in meantime

## 2012-02-09 ENCOUNTER — Telehealth: Payer: Self-pay | Admitting: Internal Medicine

## 2012-02-09 ENCOUNTER — Encounter: Payer: Self-pay | Admitting: Internal Medicine

## 2012-02-09 ENCOUNTER — Ambulatory Visit (INDEPENDENT_AMBULATORY_CARE_PROVIDER_SITE_OTHER): Payer: Medicare Other | Admitting: Internal Medicine

## 2012-02-09 VITALS — BP 128/78 | HR 102 | Ht 71.0 in | Wt 272.6 lb

## 2012-02-09 DIAGNOSIS — G4733 Obstructive sleep apnea (adult) (pediatric): Secondary | ICD-10-CM

## 2012-02-09 NOTE — Patient Instructions (Addendum)
We will ask [facility] for your current CPAP pressure setting and the latest pressure titration download report.   I anticipate that I will increase your CPAP pressure at least a little. This should help with the smothering sensation and snoring.   I think the drooling is because your facial muscles are being relaxed by your pain meds, and CPAP adjustment may not help that. A chin strap from [facility] or the drug store may be our best strategy to keep your mouth closed.

## 2012-02-09 NOTE — Progress Notes (Signed)
02/09/12- 39 yoM never smoker referred courtesy of Dr Sherene Sires for sleep medicine evaluation because of obstructive sleep apnea. PCP Dr Jillyn Hidden   Wife is here NPSG 04/09/08- AHI 12.8/ hr. at that time he was having loud snoring, daytime tiredness and witnessed apneas. Life has been better using CPAP/[facility], nasal pillows mask. His wife tells him that he snores through the mask complains that he drools. Recent pressure change did not help. Bedtime 9:30 PM, sleep latency 30 minutes, waking 45 times during the night before up between 7:30 and 9 AM. Has lost 30 pounds. An overnight oximetry with CPAP and room air on 12/23/2011 indicated 16 minutes and 44 seconds with desaturation less than or equal to 89% and 5 minutes and 40 seconds with saturation less than or equal to 88%. Occasional stuffy nose and wheeze. Oxycodone for back pain/cannot sleep on back. Treated for high blood pressure.  Prior to Admission medications   Medication Sig Start Date End Date Taking? Authorizing Provider  clonazePAM (KLONOPIN) 1 MG tablet Take 1 mg by mouth 2 (two) times daily as needed.   Yes Historical Provider, MD  cyclobenzaprine (FLEXERIL) 5 MG tablet Take 5 mg by mouth 2 (two) times daily as needed.   Yes Historical Provider, MD  DULoxetine HCl (CYMBALTA PO) Take 1 tablet by mouth daily.   Yes Historical Provider, MD  GLIPIZIDE XL 5 MG 24 hr tablet 1 tablet daily 01/13/12  Yes Historical Provider, MD  hydrochlorothiazide (HYDRODIURIL) 25 MG tablet Take 25 mg by mouth daily.   Yes Historical Provider, MD  insulin glargine (LANTUS) 100 UNIT/ML injection Take as directed   Yes Historical Provider, MD  losartan (COZAAR) 100 MG tablet Take 100 mg by mouth daily.   Yes Historical Provider, MD  oxyCODONE (OXYCONTIN) 20 MG 12 hr tablet Take 20 mg by mouth every 12 (twelve) hours.   Yes Historical Provider, MD  oxyCODONE (ROXICODONE) 15 MG immediate release tablet Take 15 mg by mouth 3 (three) times daily.   Yes Historical  Provider, MD  PRAVASTATIN SODIUM PO Take 2 tablets by mouth at bedtime.   Yes Historical Provider, MD   Past Medical History  Diagnosis Date  . Hypertension   . OSA (obstructive sleep apnea)   . DVT (deep venous thrombosis)    Past Surgical History  Procedure Date  . Back surgery    No family history on file. History   Social History  . Marital Status: Married    Spouse Name: N/A    Number of Children: N/A  . Years of Education: N/A   Occupational History  . Not on file.   Social History Main Topics  . Smoking status: Never Smoker   . Smokeless tobacco: Never Used  . Alcohol Use: No  . Drug Use: No  . Sexually Active: Not on file   Other Topics Concern  . Not on file   Social History Narrative  . No narrative on file   ROS-see HPI Constitutional:   No-   weight loss, night sweats, fevers, chills, +fatigue, lassitude. HEENT:   No-  headaches, difficulty swallowing, tooth/dental problems, sore throat,       No-  sneezing, itching, ear ache, +nasal congestion, post nasal drip,  CV:  No-   chest pain, orthopnea, PND, swelling in lower extremities, anasarca, dizziness, palpitations Resp: No-   shortness of breath with exertion or at rest.              No-   productive cough,  No non-productive cough,  No- coughing up of blood.              No-   change in color of mucus.  No- wheezing.   Skin: No-   rash or lesions. GI:  No-   heartburn, indigestion, abdominal pain, nausea, vomiting, diarrhea,                 change in bowel habits, loss of appetite GU: No-   dysuria, change in color of urine, no urgency or frequency.  No- flank pain. MS:  No-   joint pain or swelling.  No- decreased range of motion.  + back pain. Neuro-     nothing unusual Psych:  No- change in mood or affect. No depression or anxiety.  No memory loss.  OBJ- Physical Exam General- Alert, Oriented, Affect-appropriate, Distress- none . Overweight Skin- rash-none, lesions- none, excoriation-  none Lymphadenopathy- none Head- atraumatic            Eyes- Gross vision intact, PERRLA, conjunctivae and secretions clear            Ears- +Hearing aid            Nose- Clear, no-Septal dev, mucus, polyps, erosion, perforation             Throat- Mallampati III , mucosa clear , drainage- none, tonsils- atrophic. +Dentures Neck- flexible , trachea midline, no stridor , thyroid nl, carotid no bruit Chest - symmetrical excursion , unlabored           Heart/CV- RRR , no murmur , no gallop  , no rub, nl s1 s2                           - JVD- none , edema- none, stasis changes- none, varices- none           Lung- clear to P&A, wheeze- none, cough- none , dullness-none, rub- none           Chest wall- + wearing a back brace Abd- tender-no, distended-no, bowel sounds-present, HSM- no Br/ Gen/ Rectal- Not done, not indicated Extrem- cyanosis- none, clubbing, none, atrophy- none, strength- nl Neuro- grossly intact to observation

## 2012-02-09 NOTE — Telephone Encounter (Signed)
Error. No msg needed per katie. Max [name]

## 2012-02-17 ENCOUNTER — Telehealth: Payer: Self-pay | Admitting: Internal Medicine

## 2012-02-17 DIAGNOSIS — G4733 Obstructive sleep apnea (adult) (pediatric): Secondary | ICD-10-CM

## 2012-02-17 NOTE — Telephone Encounter (Signed)
I spoke with pt and he stated he took his cpap machine to [facility] and was advised he is having inconstancies with his cpap. He feels like his CPAP is suffocating him instead of helping him. He has had his current CPAP machine for about 4 years. He states he is having "many" problems with his sleep due to this. Please advise thanks  Last OV 02/09/12 Pending 03/27/12

## 2012-02-18 NOTE — Telephone Encounter (Signed)
Pt aware and verbalized understanding.  

## 2012-02-18 NOTE — Telephone Encounter (Signed)
Ok to order replacement for broken CPAP machine at current pressure, with humidifier and supplies. Dx OSA. We will see what his DME and insurance can do.

## 2012-02-18 NOTE — Telephone Encounter (Signed)
Order was sent to [facility] for pt

## 2012-02-19 ENCOUNTER — Encounter: Payer: Self-pay | Admitting: Internal Medicine

## 2012-02-19 NOTE — Assessment & Plan Note (Addendum)
Complicating issues include chronic back pain and use of narcotic analgesics. His overnight oximetry demonstrated mild oxygen desaturation despite CPAP. He may not have had a pressure download recently. Plan-auto titrate for pressure recommendation

## 2012-02-21 ENCOUNTER — Telehealth: Payer: Self-pay | Admitting: Internal Medicine

## 2012-02-21 NOTE — Telephone Encounter (Signed)
Pt states that his insurance, [employer], advised that he needs to have his doc request a prior authorization to get a new CPAP & to make the point of all of the "adjustment" issues he has had w/ this particular CPAP.  Antionette Fairy

## 2012-02-21 NOTE — Telephone Encounter (Signed)
LMTCB

## 2012-02-22 NOTE — Telephone Encounter (Signed)
LMOMTCB x 1 

## 2012-02-23 ENCOUNTER — Telehealth: Payer: Self-pay | Admitting: Internal Medicine

## 2012-02-23 DIAGNOSIS — G4733 Obstructive sleep apnea (adult) (pediatric): Secondary | ICD-10-CM

## 2012-02-23 NOTE — Telephone Encounter (Signed)
[facility], can you check into what is needed for this pt? An order was sent to [facility] on 02/18/12 for a new cpap machine. Not sure what is needed.Carron Curie, CMA

## 2012-02-23 NOTE — Telephone Encounter (Signed)
Called and left message for Max [name] with [facility] to call me in regards to status of this order. [name]

## 2012-02-23 NOTE — Telephone Encounter (Signed)
lmomtcb for lecretia 

## 2012-02-23 NOTE — Telephone Encounter (Signed)
Mayra Reel stated that [facility] has started the prior authorization with [employer] for pt's replacement cpap device. Per [employer], this process can take up to 5-7 days for a decision. Called and spoke with patient and advised him that this is something that the DME company does, b/c it is their equipment.[facility] has started this process and should here something back towards the end of next week. May take a little longer due to the holiday. Advised patient that if he hasn't heard anything by Friday 03/03/12 to call me back and I would be happy to follow up with [facility] on the status of his machine. Pt voiced understanding and thanked my for my help. [name]

## 2012-02-25 NOTE — Telephone Encounter (Signed)
Order sent to [facility]. Nothing further is needed.

## 2012-02-25 NOTE — Telephone Encounter (Signed)
Per CY - ok to order DME [facility] manometer check of CPAP. Pressure should be 13.

## 2012-02-25 NOTE — Telephone Encounter (Signed)
Per [place], pts insurance will not cover a new cpap because the current cpap is less than 64 years old. [facility] would like a new order to check cpap with a manometer to see if the cpap is actually putting out the correct pressure since pt feels he is suffocating when wearing. CDY, are you okay with placing an order for this? Pls advise.

## 2012-03-16 ENCOUNTER — Encounter: Payer: Self-pay | Admitting: Internal Medicine

## 2012-03-27 ENCOUNTER — Ambulatory Visit: Payer: Medicare Other | Admitting: Internal Medicine

## 2012-03-27 ENCOUNTER — Encounter: Payer: Self-pay | Admitting: Internal Medicine

## 2012-03-27 ENCOUNTER — Ambulatory Visit (INDEPENDENT_AMBULATORY_CARE_PROVIDER_SITE_OTHER): Payer: Medicare Other | Admitting: Internal Medicine

## 2012-03-27 VITALS — BP 120/78 | HR 90 | Ht 71.0 in | Wt 276.2 lb

## 2012-03-27 DIAGNOSIS — G4733 Obstructive sleep apnea (adult) (pediatric): Secondary | ICD-10-CM

## 2012-03-27 NOTE — Patient Instructions (Signed)
Order- DME [facility] reduce CPAP pressure to 12 for comfort.    Replacement/ refit mask of choice to reduce leak

## 2012-03-27 NOTE — Progress Notes (Signed)
02/09/12- 22 yoM never smoker referred courtesy of Dr Sherene Sires for sleep medicine evaluation because of obstructive sleep apnea. PCP Dr Jillyn Hidden   Wife is here NPSG 04/09/08- AHI 12.8/ hr. at that time he was having loud snoring, daytime tiredness and witnessed apneas. Life has been better using CPAP/[facility], nasal pillows mask. His wife tells him that he snores through the mask complains that he drools. Recent pressure change did not help. Bedtime 9:30 PM, sleep latency 30 minutes, waking 45 times during the night before up between 7:30 and 9 AM. Has lost 30 pounds. An overnight oximetry with CPAP and room air on 12/23/2011 indicated 16 minutes and 44 seconds with desaturation less than or equal to 89% and 5 minutes and 40 seconds with saturation less than or equal to 88%. Occasional stuffy nose and wheeze. Oxycodone for back pain/cannot sleep on back. Treated for high blood pressure.  03/27/12- 68 yoM never smoker referred courtesy of Dr Sherene Sires for sleep medicine evaluation because of obstructive sleep apnea. PCP Dr Jillyn Hidden   Wife is here FOLLOWS FOR: was changed from nasal pillows to full face mask and having trouble-breaking face out and leaking. Wears CPAP every night CPAP 13/[facility]. Good compliance and control demonstrated with download on 02/10/2012. Mask is not comfortable-leaks.  ROS-see HPI Constitutional:   No-   weight loss, night sweats, fevers, chills, fatigue, lassitude. HEENT:   No-  headaches, difficulty swallowing, tooth/dental problems, sore throat,       No-  sneezing, itching, ear ache, nasal congestion, post nasal drip,  CV:  No-   chest pain, orthopnea, PND, swelling in lower extremities, anasarca, dizziness, palpitations Resp: No-   shortness of breath with exertion or at rest.              No-   productive cough,  No non-productive cough,  No- coughing up of blood.              No-   change in color of mucus.  No- wheezing.   Skin: No-   rash or lesions. GI:  No-   heartburn,  indigestion, abdominal pain, nausea, vomiting,  GU: . MS:  No-   joint pain or swelling.  + back pain. Neuro-     nothing unusual Psych:  No- change in mood or affect. No depression or anxiety.  No memory loss.  OBJ- Physical Exam General- Alert, Oriented, Affect-appropriate, Distress- none . Overweight Skin- rash-none, lesions- none, excoriation- none Lymphadenopathy- none Head- atraumatic. ? Small purple bruise R corner of mouth            Eyes- Gross vision intact, PERRLA, conjunctivae and secretions clear            Ears- +Hearing aid            Nose- Clear, no-Septal dev, mucus, polyps, erosion, perforation             Throat- Mallampati III , mucosa clear , drainage- none, tonsils- atrophic. +Dentures Neck- flexible , trachea midline, no stridor , thyroid nl, carotid no bruit Chest - symmetrical excursion , unlabored           Heart/CV- RRR , no murmur , no gallop  , no rub, nl s1 s2                           - JVD- none , edema- none, stasis changes- none, varices- none  Lung- clear to P&A, wheeze- none, cough- none , dullness-none, rub- none           Chest wall- + wearing a back brace Abd-  Br/ Gen/ Rectal- Not done, not indicated Extrem- cyanosis- none, clubbing, none, atrophy- none, strength- nl Neuro- grossly intact to observation

## 2012-04-07 NOTE — Assessment & Plan Note (Addendum)
Good compliance and control CPAP 13 Plan-weight loss is again recommended. Pressure is contributing to his mask discomfort. We are going to see if pressure reduction to 12 would be effective, with less leak. DME/[facility] is to refit/replace his mask

## 2012-04-18 ENCOUNTER — Ambulatory Visit (HOSPITAL_BASED_OUTPATIENT_CLINIC_OR_DEPARTMENT_OTHER): Payer: Medicare Other | Attending: Internal Medicine

## 2012-09-25 ENCOUNTER — Ambulatory Visit: Payer: Medicare Other | Admitting: Internal Medicine

## 2012-09-27 ENCOUNTER — Ambulatory Visit (INDEPENDENT_AMBULATORY_CARE_PROVIDER_SITE_OTHER): Payer: Medicare Other | Admitting: Internal Medicine

## 2012-09-27 ENCOUNTER — Encounter: Payer: Self-pay | Admitting: Internal Medicine

## 2012-09-27 VITALS — BP 166/84 | HR 98 | Ht 71.0 in | Wt 271.2 lb

## 2012-09-27 DIAGNOSIS — R0609 Other forms of dyspnea: Secondary | ICD-10-CM

## 2012-09-27 DIAGNOSIS — R0989 Other specified symptoms and signs involving the circulatory and respiratory systems: Secondary | ICD-10-CM

## 2012-09-27 DIAGNOSIS — G4733 Obstructive sleep apnea (adult) (pediatric): Secondary | ICD-10-CM

## 2012-09-27 DIAGNOSIS — R062 Wheezing: Secondary | ICD-10-CM

## 2012-09-27 DIAGNOSIS — R0600 Dyspnea, unspecified: Secondary | ICD-10-CM

## 2012-09-27 MED ORDER — ALBUTEROL SULFATE HFA 108 (90 BASE) MCG/ACT IN AERS: 2.0000 | INHALATION_SPRAY | Freq: Four times a day (QID) | RESPIRATORY_TRACT | Status: DC | PRN

## 2012-09-27 NOTE — Patient Instructions (Addendum)
Sample albuterol HFA "rescue inhaler"      2 puffs up to 4 times daily if needed for wheeze or shortness of breath.  Order-[facility]- He would like to talk about changing DME for better service.    Has CPAP 13, mask of choice, humidifier, supplies    Needs new mask   Dx OSA

## 2012-09-27 NOTE — Progress Notes (Signed)
02/09/12- 30 yoM never smoker referred courtesy of Dr Sherene Sires for sleep medicine evaluation because of obstructive sleep apnea. PCP Dr Jillyn Hidden   Wife is here NPSG 04/09/08- AHI 12.8/ hr. at that time he was having loud snoring, daytime tiredness and witnessed apneas. Life has been better using CPAP/[facility], nasal pillows mask. His wife tells him that he snores through the mask complains that he drools. Recent pressure change did not help. Bedtime 9:30 PM, sleep latency 30 minutes, waking 45 times during the night before up between 7:30 and 9 AM. Has lost 30 pounds. An overnight oximetry with CPAP and room air on 12/23/2011 indicated 16 minutes and 44 seconds with desaturation less than or equal to 89% and 5 minutes and 40 seconds with saturation less than or equal to 88%. Occasional stuffy nose and wheeze. Oxycodone for back pain/cannot sleep on back. Treated for high blood pressure.  03/27/12- 39 yoM never smoker referred courtesy of Dr Sherene Sires for sleep medicine evaluation because of obstructive sleep apnea. PCP Dr Jillyn Hidden   Wife is here FOLLOWS FOR: was changed from nasal pillows to full face mask and having trouble-breaking face out and leaking. Wears CPAP every night CPAP 13/[facility]. Good compliance and control demonstrated with download on 02/10/2012. Mask is not comfortable-leaks.  09/27/12- 22 yoM never smoker referred courtesy of Dr Sherene Sires for sleep medicine evaluation because of obstructive sleep apnea.           Wife here Using cpap 13/ [facility] every night, no problems with pressure, mask is worn out, They have problems with service and billing management at [facility], seek to change DME. Notes wheezing with exertion and with pain medicines.  ROS-see HPI Constitutional:   No-   weight loss, night sweats, fevers, chills, fatigue, lassitude. HEENT:   No-  headaches, difficulty swallowing, tooth/dental problems, sore throat,       No-  sneezing, itching, ear ache, nasal congestion, post nasal drip,   CV:  No-   chest pain, orthopnea, PND, swelling in lower extremities, anasarca, dizziness, palpitations Resp: No-   shortness of breath with exertion or at rest.              No-   productive cough,  No non-productive cough,  No- coughing up of blood.              No-   change in color of mucus.  + wheezing.   Skin: No-   rash or lesions. GI:  No-   heartburn, indigestion, abdominal pain, nausea, vomiting,  GU: . MS:  No-   joint pain or swelling.  + back pain. Neuro-     nothing unusual Psych:  No- change in mood or affect. No depression or anxiety.  No memory loss.  OBJ- Physical Exam General- Alert, Oriented, Affect-appropriate, Distress- none . Overweight Skin- rash-none, lesions- none, excoriation- none Lymphadenopathy- none Head- atraumatic. ? Small purple bruise R corner of mouth            Eyes- Gross vision intact, PERRLA, conjunctivae and secretions clear            Ears- +Hearing aid            Nose- Clear, no-Septal dev, mucus, polyps, erosion, perforation             Throat- Mallampati III , mucosa clear , drainage- none, tonsils- atrophic. +Dentures Neck- flexible , trachea midline, no stridor , thyroid nl, carotid no bruit Chest - symmetrical excursion , unlabored  Heart/CV- RRR , no murmur , no gallop  , no rub, nl s1 s2                           - JVD- none , edema- none, stasis changes- none, varices- none           Lung- clear to P&A, wheeze- none, cough- none , dullness-none, rub- none           Chest wall- + wearing a back brace Abd-  Br/ Gen/ Rectal- Not done, not indicated Extrem- cyanosis- none, clubbing, none, atrophy- none, strength- nl Neuro- grossly intact to observation

## 2012-10-14 DIAGNOSIS — J4520 Mild intermittent asthma, uncomplicated: Secondary | ICD-10-CM | POA: Insufficient documentation

## 2012-10-14 NOTE — Assessment & Plan Note (Signed)
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 Plan-try rescue inhaler

## 2012-10-14 NOTE — Assessment & Plan Note (Signed)
Good compliance and control. Plan-our staff will help change DME company at patient request, order new mask

## 2013-03-30 ENCOUNTER — Ambulatory Visit: Payer: Medicare Other | Admitting: Internal Medicine

## 2014-07-01 ENCOUNTER — Telehealth: Payer: Self-pay | Admitting: Internal Medicine

## 2014-07-01 MED ORDER — ALBUTEROL SULFATE HFA 108 (90 BASE) MCG/ACT IN AERS: 2.0000 | INHALATION_SPRAY | Freq: Four times a day (QID) | RESPIRATORY_TRACT | Status: DC | PRN

## 2014-07-01 NOTE — Telephone Encounter (Signed)
Spoke with pt, stressed the importance of keeping ov's so meds can continue to be filled.  Sent albuterol to pharmacy.  Nothing further needed.

## 2014-09-02 ENCOUNTER — Encounter (INDEPENDENT_AMBULATORY_CARE_PROVIDER_SITE_OTHER): Payer: Self-pay

## 2014-09-02 ENCOUNTER — Ambulatory Visit (INDEPENDENT_AMBULATORY_CARE_PROVIDER_SITE_OTHER): Payer: Medicare Other | Admitting: Internal Medicine

## 2014-09-02 ENCOUNTER — Encounter: Payer: Self-pay | Admitting: Internal Medicine

## 2014-09-02 VITALS — BP 120/88 | HR 88 | Ht 71.0 in | Wt 273.0 lb

## 2014-09-02 DIAGNOSIS — R062 Wheezing: Secondary | ICD-10-CM | POA: Diagnosis not present

## 2014-09-02 DIAGNOSIS — G4733 Obstructive sleep apnea (adult) (pediatric): Secondary | ICD-10-CM | POA: Diagnosis not present

## 2014-09-02 NOTE — Patient Instructions (Addendum)
Order- [facility]- download CPAP for pressure compliance   Dx OSA  Sample Breo 100 Ellipta inhaler    Try 1 puff, then rinse mouth, one time daily   See if this reduces the wheezing. You can let us know what you think.

## 2014-09-02 NOTE — Progress Notes (Signed)
02/09/12- 61 yoM never smoker referred courtesy of Dr Melvyn Novas for sleep medicine evaluation because of obstructive sleep apnea. PCP Dr Chapman Fitch   Wife is here NPSG 04/09/08- AHI 12.8/ hr. at that time he was having loud snoring, daytime tiredness and witnessed apneas. Life has been better using CPAP/[facility], nasal pillows mask. His wife tells him that he snores through the mask complains that he drools. Recent pressure change did not help. Bedtime 9:30 PM, sleep latency 30 minutes, waking 45 times during the night before up between 7:30 and 9 AM. Has lost 30 pounds. An overnight oximetry with CPAP and room air on 12/23/2011 indicated 16 minutes and 44 seconds with desaturation less than or equal to 89% and 5 minutes and 40 seconds with saturation less than or equal to 88%. Occasional stuffy nose and wheeze. Oxycodone for back pain/cannot sleep on back. Treated for high blood pressure.  03/27/12- 70 yoM never smoker referred courtesy of Dr Melvyn Novas for sleep medicine evaluation because of obstructive sleep apnea. PCP Dr Chapman Fitch   Wife is here FOLLOWS FOR: was changed from nasal pillows to full face mask and having trouble-breaking face out and leaking. Wears CPAP every night CPAP 13/[facility]. Good compliance and control demonstrated with download on 02/10/2012. Mask is not comfortable-leaks.  09/27/12- 62 yoM never smoker referred courtesy of Dr Melvyn Novas for sleep medicine evaluation because of obstructive sleep apnea.           Wife here Using cpap 13/ [facility] every night, no problems with pressure, mask is worn out, They have problems with service and billing management at [facility], seek to change DME. Notes wheezing with exertion and with pain medicines.  09/02/14- 66 yoM never smoker referred courtesy of Dr Melvyn Novas for sleep medicine evaluation because of obstructive sleep apnea, complicated by DM          Wife here Reports: [id number]/ CPAP 13,SOB is due to medications and back brace, oxycotin and oxycodone make  him wheeze with activity. Chronic ithing takes benadryl but, no relief. right ear hard of hearing.  Wife says he snores through his CPAP at times. Some wheeze most days, rescue inhaler can help but is rarely used.  ROS-see HPI Constitutional:   No-   weight loss, night sweats, fevers, chills, fatigue, lassitude. HEENT:   No-  headaches, difficulty swallowing, tooth/dental problems, sore throat,       No-  sneezing, itching, ear ache, nasal congestion, post nasal drip,  CV:  No-   chest pain, orthopnea, PND, swelling in lower extremities, anasarca, dizziness, palpitations Resp: No-   shortness of breath with exertion or at rest.              No-   productive cough,  No non-productive cough,  No- coughing up of blood.              No-   change in color of mucus.  + wheezing.   Skin: No-   rash or lesions. GI:  No-   heartburn, indigestion, abdominal pain, nausea, vomiting,  GU: . MS:  No-   joint pain or swelling.  + back pain. Neuro-     nothing unusual Psych:  No- change in mood or affect. No depression or anxiety.  No memory loss.  OBJ- Physical Exam General- Alert, Oriented, Affect-appropriate, Distress-mild due to back pain . Overweight Skin- rash-none, lesions- none, excoriation- none Lymphadenopathy- none Head- atraumatic. ? Small purple bruise R corner of mouth  Eyes- Gross vision intact, PERRLA, conjunctivae and secretions clear            Ears- +Hearing aid/ HOH            Nose- Clear, no-Septal dev, mucus, polyps, erosion, perforation             Throat- Mallampati III , mucosa clear , drainage- none, tonsils- atrophic. +Dentures Neck- flexible , trachea midline, no stridor , thyroid nl, carotid no bruit Chest - symmetrical excursion , unlabored           Heart/CV- RRR , no murmur , no gallop  , no rub, nl s1 s2                           - JVD- none , edema- none, stasis changes- none, varices- none           Lung-  wheeze + unlabored, cough- none , dullness-none,  rub- none           Chest wall- + wearing a back brace Abd-  Br/ Gen/ Rectal- Not done, not indicated Extrem- cyanosis- none, clubbing, none, atrophy- none, strength- nl Neuro- grossly intact to observation

## 2014-09-03 MED ORDER — FLUTICASONE FUROATE-VILANTEROL 100-25 MCG/INH IN AEPB: 1.0000 | INHALATION_SPRAY | Freq: Every day | RESPIRATORY_TRACT | Status: DC

## 2014-09-03 NOTE — Assessment & Plan Note (Signed)
CPAP 13/[facility]. We need download. Some breakthrough snoring but not clear if he needs higher pressure, which would aggravate mask leak

## 2014-09-03 NOTE — Assessment & Plan Note (Signed)
Persistent asthma. Exam limited by his back brace and I don't think he could perform useful PFT due to his back pain and back brace. Will assess response to maintenance bronchodilator with education. Plan-sample Breo 100

## 2014-10-03 ENCOUNTER — Encounter: Payer: Self-pay | Admitting: Internal Medicine

## 2014-12-09 ENCOUNTER — Other Ambulatory Visit: Payer: Self-pay | Admitting: Otolaryngology

## 2014-12-09 DIAGNOSIS — K1122 Acute recurrent sialoadenitis: Secondary | ICD-10-CM

## 2014-12-16 ENCOUNTER — Ambulatory Visit
Admission: RE | Admit: 2014-12-16 | Discharge: 2014-12-16 | Disposition: A | Discharge status: Home / Self Care | Payer: Medicare Other | Source: Ambulatory Visit | Attending: Otolaryngology | Admitting: Otolaryngology

## 2014-12-16 DIAGNOSIS — K1122 Acute recurrent sialoadenitis: Secondary | ICD-10-CM

## 2015-03-06 ENCOUNTER — Ambulatory Visit (INDEPENDENT_AMBULATORY_CARE_PROVIDER_SITE_OTHER): Payer: Medicare Other | Admitting: Internal Medicine

## 2015-03-06 ENCOUNTER — Encounter: Payer: Self-pay | Admitting: Internal Medicine

## 2015-03-06 VITALS — BP 142/82 | HR 89 | Ht 71.0 in | Wt 279.6 lb

## 2015-03-06 DIAGNOSIS — J4520 Mild intermittent asthma, uncomplicated: Secondary | ICD-10-CM | POA: Diagnosis not present

## 2015-03-06 DIAGNOSIS — E669 Obesity, unspecified: Secondary | ICD-10-CM

## 2015-03-06 DIAGNOSIS — G4733 Obstructive sleep apnea (adult) (pediatric): Secondary | ICD-10-CM | POA: Diagnosis not present

## 2015-03-06 MED ORDER — FLUTICASONE FUROATE-VILANTEROL 100-25 MCG/INH IN AEPB: INHALATION_SPRAY | RESPIRATORY_TRACT | Status: DC

## 2015-03-06 MED ORDER — ALBUTEROL SULFATE HFA 108 (90 BASE) MCG/ACT IN AERS: 2.0000 | INHALATION_SPRAY | Freq: Four times a day (QID) | RESPIRATORY_TRACT | Status: DC | PRN

## 2015-03-06 NOTE — Progress Notes (Signed)
02/09/12- 18 yoM never smoker referred courtesy of Dr Melvyn Novas for sleep medicine evaluation because of obstructive sleep apnea. PCP Dr Chapman Fitch   Wife is here NPSG 04/09/08- AHI 12.8/ hr. at that time he was having loud snoring, daytime tiredness and witnessed apneas. Life has been better using CPAP/[facility], nasal pillows mask. His wife tells him that he snores through the mask complains that he drools. Recent pressure change did not help. Bedtime 9:30 PM, sleep latency 30 minutes, waking 45 times during the night before up between 7:30 and 9 AM. Has lost 30 pounds. An overnight oximetry with CPAP and room air on 12/23/2011 indicated 16 minutes and 44 seconds with desaturation less than or equal to 89% and 5 minutes and 40 seconds with saturation less than or equal to 88%. Occasional stuffy nose and wheeze. Oxycodone for back pain/cannot sleep on back. Treated for high blood pressure.  03/27/12- 57 yoM never smoker referred courtesy of Dr Melvyn Novas for sleep medicine evaluation because of obstructive sleep apnea. PCP Dr Chapman Fitch   Wife is here FOLLOWS FOR: was changed from nasal pillows to full face mask and having trouble-breaking face out and leaking. Wears CPAP every night CPAP 13/[facility]. Good compliance and control demonstrated with download on 02/10/2012. Mask is not comfortable-leaks.  09/27/12- 84 yoM never smoker referred courtesy of Dr Melvyn Novas for sleep medicine evaluation because of obstructive sleep apnea.           Wife here Using cpap 13/ [facility] every night, no problems with pressure, mask is worn out, They have problems with service and billing management at [facility], seek to change DME. Notes wheezing with exertion and with pain medicines.  09/02/14- 66 yoM never smoker referred courtesy of Dr Melvyn Novas for sleep medicine evaluation because of obstructive sleep apnea, complicated by DM          Wife here Reports: [id number] CPAP 13,SOB is due to medications and back brace, oxycotin and oxycodone make  him wheeze with activity. Chronic ithing takes benadryl but, no relief. right ear hard of hearing.  Wife says he snores through his CPAP at times. Some wheeze most days, rescue inhaler can help but is rarely used.  03/06/2015-69 year old male never smoker followed for OSA, complicated by DM CPAP [id number] FOLLOWS FOR: pt. sates he wears CPAP 7-8 hrs everynigt. pressure is good. no supplies needed at this time. [id number]. no DL. Wife confirms no snoring. Uses nasal pillows mask with some drooling which he doesn't mind enough to change. He definitely feels better off with CPAP. Has not changed lifestyle enough to lose weight  ROS-see HPI Constitutional:   No-   weight loss, night sweats, fevers, chills, fatigue, lassitude. HEENT:   No-  headaches, difficulty swallowing, tooth/dental problems, sore throat,       No-  sneezing, itching, ear ache, nasal congestion, post nasal drip,  CV:  No-   chest pain, orthopnea, PND, swelling in lower extremities, anasarca, dizziness, palpitations Resp: No-   shortness of breath with exertion or at rest.              No-   productive cough,  No non-productive cough,  No- coughing up of blood.              No-   change in color of mucus.  + wheezing.   Skin: No-   rash or lesions. GI:  No-   heartburn, indigestion, abdominal pain, nausea, vomiting,  GU: . MS:  No-   joint pain or  swelling.  + back pain. Neuro-     nothing unusual Psych:  No- change in mood or affect. No depression or anxiety.  No memory loss.  OBJ- Physical Exam General- Alert, Oriented, Affect-appropriate, Distress-mild due to back pain . + Overweight Skin- rash-none, lesions- none, excoriation- none Lymphadenopathy- none Head- atraumatic. ? Small purple bruise R corner of mouth            Eyes- Gross vision intact, PERRLA, conjunctivae and secretions clear            Ears- +Hearing aid/ HOH            Nose- Clear, no-Septal dev, mucus, polyps, erosion, perforation              Throat- Mallampati III , mucosa clear , drainage- none, tonsils- atrophic. +Dentures Neck- flexible , trachea midline, no stridor , thyroid nl, carotid no bruit Chest - symmetrical excursion , unlabored           Heart/CV- RRR , no murmur , no gallop  , no rub, nl s1 s2                           - JVD- none , edema- none, stasis changes- none, varices- none           Lung-  wheeze + unlabored, cough- none , dullness-none, rub- none           Chest wall- + wearing a back brace Abd-  Br/ Gen/ Rectal- Not done, not indicated Extrem- cyanosis- none, clubbing, none, atrophy- none, strength- nl Neuro- seems restless

## 2015-03-06 NOTE — Patient Instructions (Addendum)
Order- DME [facility]- download CPAP for pressure compliance; add AirView if able    Dx OSA  Refill scripts sent for Breo and Proair  Please call if we can help

## 2015-03-07 DIAGNOSIS — E669 Obesity, unspecified: Secondary | ICD-10-CM

## 2015-03-07 NOTE — Assessment & Plan Note (Signed)
Encouraging weight loss which would help him a lot

## 2015-03-07 NOTE — Assessment & Plan Note (Signed)
Adequate control with no sleep disturbance and only occasional use of rescue inhaler.

## 2015-03-07 NOTE — Assessment & Plan Note (Signed)
He continues CPAP 13. We will seek download from his DME company.

## 2015-06-25 ENCOUNTER — Ambulatory Visit (INDEPENDENT_AMBULATORY_CARE_PROVIDER_SITE_OTHER): Payer: Medicare Other | Admitting: Endocrinology

## 2015-06-25 ENCOUNTER — Encounter: Payer: Self-pay | Admitting: Endocrinology

## 2015-06-25 VITALS — BP 152/86 | HR 119 | Temp 97.8°F | Ht 71.0 in | Wt 273.0 lb

## 2015-06-25 DIAGNOSIS — Z794 Long term (current) use of insulin: Secondary | ICD-10-CM | POA: Diagnosis not present

## 2015-06-25 DIAGNOSIS — E785 Hyperlipidemia, unspecified: Secondary | ICD-10-CM

## 2015-06-25 DIAGNOSIS — E119 Type 2 diabetes mellitus without complications: Secondary | ICD-10-CM

## 2015-06-25 MED ORDER — INSULIN LISPRO 100 UNIT/ML (KWIKPEN): 15.0000 [IU] | PEN_INJECTOR | Freq: Three times a day (TID) | SUBCUTANEOUS | Status: DC

## 2015-06-25 MED ORDER — INSULIN NPH (HUMAN) (ISOPHANE) 100 UNIT/ML ~~LOC~~ SUSP: 10.0000 [IU] | Freq: Every day | SUBCUTANEOUS | Status: DC

## 2015-06-25 MED ORDER — INSULIN REGULAR HUMAN 100 UNIT/ML IJ SOLN: 15.0000 [IU] | Freq: Three times a day (TID) | INTRAMUSCULAR | Status: DC

## 2015-06-25 NOTE — Patient Instructions (Addendum)
good diet and exercise significantly improve the control of your diabetes.  please let me know if you wish to be referred to a dietician.  high blood sugar is very risky to your health.  you should see an eye doctor and dentist every year.  It is very important to get all recommended vaccinations.  controlling your blood pressure and cholesterol drastically reduces the damage diabetes does to your body.  Those who smoke should quit.  please discuss these with your doctor.  check your blood sugar twice a day.  vary the time of day when you check, between before the 3 meals, and at bedtime.  also check if you have symptoms of your blood sugar being too high or too low.  please keep a record of the readings and bring it to your next appointment here (or you can bring the meter itself).  You can write it on any piece of paper.  please call us sooner if your blood sugar goes below 70, or if you have a lot of readings over 200. Please consider having weight loss surgery.  It is good for your health.  Here is some information about it.  If you decide to consider further, please call the phone number in the papers, and register for a free informational meeting. Please stop taking the glipizide, and: change the lantus to NPH, 10 units at bedtime, and:  Start reg insulin, 15 units 3 times a day (just before each meal).    Please come back for a follow-up appointment in 1 month.

## 2015-06-25 NOTE — Progress Notes (Signed)
Subjective:    Patient ID: Max [name], male    DOB: 29-Jun-1945, 70 y.o.   MRN: [id number]  HPI pt states DM was dx'ed in 1998; he has mild if any neuropathy of the lower extremities; he is unaware of any associated chronic complications; he has been on insulin since 2004; pt says his diet is good, but exercise is limited by health probs; he has never had pancreatitis, severe hypoglycemia or DKA.  He takes lantus, 40-50 units qhs, according to cbg.  He says cbg's vary from 90-200's.  It is in general higher as the day goes on.   Past Medical History  Diagnosis Date  . Hypertension   . OSA (obstructive sleep apnea)   . DVT (deep venous thrombosis) ([place])   . Dyslipidemia     Past Surgical History  Procedure Laterality Date  . Back surgery      Social History   Social History  . Marital Status: Married    Spouse Name: N/A  . Number of Children: N/A  . Years of Education: N/A   Occupational History  . Not on file.   Social History Main Topics  . Smoking status: Never Smoker   . Smokeless tobacco: Never Used  . Alcohol Use: No  . Drug Use: No  . Sexual Activity: Not on file   Other Topics Concern  . Not on file   Social History Narrative    Current Outpatient Prescriptions on File Prior to Visit  Medication Sig Dispense Refill  . albuterol (PROAIR HFA) 108 (90 BASE) MCG/ACT inhaler Inhale 2 puffs into the lungs 4 (four) times daily as needed for shortness of breath. 1 Inhaler 12  . amLODipine (NORVASC) 10 MG tablet     . cyclobenzaprine (FLEXERIL) 5 MG tablet Take 5 mg by mouth 2 (two) times daily as needed.    . Fluticasone Furoate-Vilanterol (BREO ELLIPTA) 100-25 MCG/INH AEPB Inhale 1 puff then rinse mouth, once daily 1 each 12  . hydrochlorothiazide (HYDRODIURIL) 25 MG tablet Take 25 mg by mouth daily.    Marland Kitchen losartan (COZAAR) 100 MG tablet Take 100 mg by mouth daily.    Marland Kitchen oxyCODONE (OXYCONTIN) 20 MG 12 hr tablet Take 20 mg by mouth every 12 (twelve) hours.      Marland Kitchen oxyCODONE (ROXICODONE) 15 MG immediate release tablet Take 15 mg by mouth 3 (three) times daily.    Marland Kitchen PRAVASTATIN SODIUM PO Take 2 tablets by mouth at bedtime.     No current facility-administered medications on file prior to visit.    Allergies  Allergen Reactions  . Butane     High anxiety and hallucinations  . Topamax [Topiramate]     Hallucinations     Family History  Problem Relation Age of Onset  . Diabetes Mother   . Diabetes Brother     BP 152/86 mmHg  Pulse 119  Temp(Src) 97.8 F (36.6 C) (Oral)  Ht 5\' 11"  (1.803 m)  Wt 273 lb (123.832 kg)  BMI 38.09 kg/m2  SpO2 90%  Review of Systems denies weight loss, blurry vision, headache, chest pain, n/v, cold intolerance, rhinorrhea, and easy bruising.  He has chronic doe, leg cramps, excessive diaphoresis, urinary frequency, and low back pain.  He has some memory loss.      Objective:   Physical Exam VS: see vs page GEN: no distress.  Morbid obesity HEAD: head: no deformity eyes: no periorbital swelling, no proptosis external nose and ears are normal mouth: no lesion  seen Ears: bilat hearing aids NECK: supple, thyroid is not enlarged CHEST WALL: no deformity LUNGS: clear to auscultation BREASTS:  No gynecomastia CV: reg rate and rhythm, no murmur ABD: abdomen is soft, nontender.  no hepatosplenomegaly.  not distended.  no hernia MUSCULOSKELETAL: muscle bulk and strength are grossly normal.  no obvious joint swelling.  gait is steady with a cane.  frewuent back spasms during exam   EXTEMITIES: no deformity.  no ulcer on the feet.  feet are of normal color and temp.  trace bilat leg edema.  There is bilateral onychomycosis of the toenails.  PULSES: dorsalis pedis intact bilat.  no carotid bruit NEURO:  cn 2-12 grossly intact.   readily moves all 4's.  sensation is intact to touch on the feet SKIN:  Normal texture and temperature.  No rash or suspicious lesion is visible.  Diaphoretic.   NODES:  None palpable at  the neck PSYCH: alert, well-oriented.  Does not appear anxious nor depressed.   outside test results are reviewed: A1c=8.7%  I have reviewed outside records, and summarized: Pt was noted to have elevated a1c, and referred here.      Assessment & Plan:  DM: he needs increased rx.  He declines V-GO, but he agrees to take multiple daily injections Morbid obesity, new to me HTN and tachycardia, uncertain etiology.  We'll recheck next time.    Patient is advised the following: Patient Instructions  good diet and exercise significantly improve the control of your diabetes.  please let me know if you wish to be referred to a dietician.  high blood sugar is very risky to your health.  you should see an eye doctor and dentist every year.  It is very important to get all recommended vaccinations.  controlling your blood pressure and cholesterol drastically reduces the damage diabetes does to your body.  Those who smoke should quit.  please discuss these with your doctor.  check your blood sugar twice a day.  vary the time of day when you check, between before the 3 meals, and at bedtime.  also check if you have symptoms of your blood sugar being too high or too low.  please keep a record of the readings and bring it to your next appointment here (or you can bring the meter itself).  You can write it on any piece of paper.  please call us sooner if your blood sugar goes below 70, or if you have a lot of readings over 200. Please consider having weight loss surgery.  It is good for your health.  Here is some information about it.  If you decide to consider further, please call the phone number in the papers, and register for a free informational meeting. Please stop taking the glipizide, and: change the lantus to NPH, 10 units at bedtime, and:  Start reg insulin, 15 units 3 times a day (just before each meal).    Please come back for a follow-up appointment in 1 month.

## 2015-06-26 ENCOUNTER — Encounter: Payer: Self-pay | Admitting: Endocrinology

## 2015-06-26 DIAGNOSIS — E785 Hyperlipidemia, unspecified: Secondary | ICD-10-CM | POA: Insufficient documentation

## 2015-06-26 DIAGNOSIS — E119 Type 2 diabetes mellitus without complications: Secondary | ICD-10-CM | POA: Insufficient documentation

## 2015-06-27 ENCOUNTER — Other Ambulatory Visit: Payer: Self-pay

## 2015-06-27 MED ORDER — INSULIN REGULAR HUMAN 100 UNIT/ML IJ SOLN: 15.0000 [IU] | Freq: Three times a day (TID) | INTRAMUSCULAR | Status: DC

## 2015-07-04 ENCOUNTER — Telehealth: Payer: Self-pay | Admitting: Endocrinology

## 2015-07-04 MED ORDER — INSULIN REGULAR HUMAN 100 UNIT/ML IJ SOLN: 15.0000 [IU] | Freq: Three times a day (TID) | INTRAMUSCULAR | Status: DC

## 2015-07-04 MED ORDER — INSULIN NPH (HUMAN) (ISOPHANE) 100 UNIT/ML ~~LOC~~ SUSP: 10.0000 [IU] | Freq: Every day | SUBCUTANEOUS | Status: DC

## 2015-07-04 NOTE — Telephone Encounter (Signed)
Patient stated that medication needs Prior Auth, insulin NPH Human (NOVOLIN N) 100 UNIT/ML injection, insulin regular (NOVOLIN R RELION) 100 units/mL injection.

## 2015-07-04 NOTE — Telephone Encounter (Signed)
I contacted the pt and advise he could purchase the medication w/o insurance at [facility]. Pt voiced understanding and stated he would do that. Rx sent for the pt.

## 2015-07-25 ENCOUNTER — Ambulatory Visit: Payer: Medicare Other | Admitting: Endocrinology

## 2015-08-12 ENCOUNTER — Ambulatory Visit (INDEPENDENT_AMBULATORY_CARE_PROVIDER_SITE_OTHER): Payer: Medicare Other | Admitting: Endocrinology

## 2015-08-12 ENCOUNTER — Encounter: Payer: Self-pay | Admitting: Endocrinology

## 2015-08-12 VITALS — BP 136/76 | HR 104 | Ht 71.5 in | Wt 270.0 lb

## 2015-08-12 DIAGNOSIS — Z794 Long term (current) use of insulin: Secondary | ICD-10-CM

## 2015-08-12 DIAGNOSIS — E119 Type 2 diabetes mellitus without complications: Secondary | ICD-10-CM | POA: Diagnosis not present

## 2015-08-12 LAB — POCT GLYCOSYLATED HEMOGLOBIN (HGB A1C): Hemoglobin A1C: 7.9

## 2015-08-12 MED ORDER — INSULIN REGULAR HUMAN 100 UNIT/ML IJ SOLN: INTRAMUSCULAR | Status: DC

## 2015-08-12 NOTE — Patient Instructions (Addendum)
check your blood sugar twice a day.  vary the time of day when you check, between before the 3 meals, and at bedtime.  also check if you have symptoms of your blood sugar being too high or too low.  please keep a record of the readings and bring it to your next appointment here (or you can bring the meter itself).  You can write it on any piece of paper.  please call us sooner if your blood sugar goes below 70, or if you have a lot of readings over 200. Please change the lantus back to NPH, 10 units at bedtime, and:  take reg insulin, 3 times a day (just before each meal): 01-11-19 units  Please come back for a follow-up appointment in 2-3 weeks.

## 2015-08-12 NOTE — Progress Notes (Signed)
Subjective:    Patient ID: Max [name], male    DOB: 1945-06-23, 70 y.o.   MRN: [id number]  HPI  Pt returns for f/u of diabetes mellitus: DM type: Insulin-requiring type 2 Dx'ed: [id number] Complications:  Therapy: insulin since 2004 DKA: never Severe hypoglycemia: never Pancreatitis: never Other: he takes multiple daily injections; he declines V-GO Interval history: Since on the reg, he he has slight nodules under the skin of the legs (not at injection sites), but no assoc pain.  He says the nodules are resolved since he stopped reg insulin, and went back to lantus only.  While on the multiple daily injections, cbg's varies from 70-200.  It was lowest at lunch, and highest at hs.  Past Medical History  Diagnosis Date  . Hypertension   . OSA (obstructive sleep apnea)   . DVT (deep venous thrombosis) ([place])   . Dyslipidemia     Past Surgical History  Procedure Laterality Date  . Back surgery      Social History   Social History  . Marital Status: Married    Spouse Name: N/A  . Number of Children: N/A  . Years of Education: N/A   Occupational History  . Not on file.   Social History Main Topics  . Smoking status: Never Smoker   . Smokeless tobacco: Never Used  . Alcohol Use: No  . Drug Use: No  . Sexual Activity: Not on file   Other Topics Concern  . Not on file   Social History Narrative    Current Outpatient Prescriptions on File Prior to Visit  Medication Sig Dispense Refill  . albuterol (PROAIR HFA) 108 (90 BASE) MCG/ACT inhaler Inhale 2 puffs into the lungs 4 (four) times daily as needed for shortness of breath. 1 Inhaler 12  . amLODipine (NORVASC) 10 MG tablet     . Fluticasone Furoate-Vilanterol (BREO ELLIPTA) 100-25 MCG/INH AEPB Inhale 1 puff then rinse mouth, once daily 1 each 12  . hydrochlorothiazide (HYDRODIURIL) 25 MG tablet Take 25 mg by mouth daily.    Marland Kitchen losartan (COZAAR) 100 MG tablet Take 100 mg by mouth daily.    Marland Kitchen oxyCODONE (OXYCONTIN) 20  MG 12 hr tablet Take 20 mg by mouth every 12 (twelve) hours.    Marland Kitchen oxyCODONE (ROXICODONE) 15 MG immediate release tablet Take 15 mg by mouth 3 (three) times daily.    Marland Kitchen PRAVASTATIN SODIUM PO Take 2 tablets by mouth at bedtime.    . tamsulosin (FLOMAX) 0.4 MG CAPS capsule Take 0.4 mg by mouth.    . baclofen (LIORESAL) 10 MG tablet Take 10 mg by mouth 3 (three) times daily. Reported on 08/12/2015    . cyclobenzaprine (FLEXERIL) 5 MG tablet Take 5 mg by mouth 2 (two) times daily as needed. Reported on 08/12/2015    . hydrOXYzine (VISTARIL) 25 MG capsule Take 25 mg by mouth 3 (three) times daily as needed. Reported on 08/12/2015    . insulin NPH Human (NOVOLIN N) 100 UNIT/ML injection Inject 0.1 mLs (10 Units total) into the skin at bedtime. 10 mL 11   No current facility-administered medications on file prior to visit.    Allergies  Allergen Reactions  . Butane     High anxiety and hallucinations  . Topamax [Topiramate]     Hallucinations     Family History  Problem Relation Age of Onset  . Diabetes Mother   . Diabetes Brother     BP 136/76 mmHg  Pulse 104  Ht  5' 11.5" (1.816 m)  Wt 270 lb (122.471 kg)  BMI 37.14 kg/m2  SpO2 96%  Review of Systems Denies weight change and fever.      Objective:   Physical Exam VITAL SIGNS:  See vs page GENERAL: no distress Skin: i cannot appreciate the skin nodules   Lab Results  Component Value Date   HGBA1C 7.9 08/12/2015      Assessment & Plan:  DM: he needs increased rx Skin nodules, new, very unlikely due to insulin.  Call if persists.  Patient is advised the following: Patient Instructions  check your blood sugar twice a day.  vary the time of day when you check, between before the 3 meals, and at bedtime.  also check if you have symptoms of your blood sugar being too high or too low.  please keep a record of the readings and bring it to your next appointment here (or you can bring the meter itself).  You can write it on any piece  of paper.  please call us sooner if your blood sugar goes below 70, or if you have a lot of readings over 200. Please change the lantus back to NPH, 10 units at bedtime, and:  take reg insulin, 3 times a day (just before each meal): 01-11-19 units  Please come back for a follow-up appointment in 2-3 weeks.

## 2015-09-02 ENCOUNTER — Ambulatory Visit: Payer: Medicare Other | Admitting: Endocrinology

## 2015-09-10 ENCOUNTER — Ambulatory Visit (INDEPENDENT_AMBULATORY_CARE_PROVIDER_SITE_OTHER): Payer: Medicare Other | Admitting: Endocrinology

## 2015-09-10 ENCOUNTER — Encounter: Payer: Self-pay | Admitting: Endocrinology

## 2015-09-10 VITALS — BP 130/88 | HR 90 | Ht 71.0 in | Wt 268.0 lb

## 2015-09-10 DIAGNOSIS — Z794 Long term (current) use of insulin: Secondary | ICD-10-CM

## 2015-09-10 DIAGNOSIS — E119 Type 2 diabetes mellitus without complications: Secondary | ICD-10-CM | POA: Diagnosis not present

## 2015-09-10 MED ORDER — INSULIN NPH (HUMAN) (ISOPHANE) 100 UNIT/ML ~~LOC~~ SUSP: 5.0000 [IU] | Freq: Every day | SUBCUTANEOUS | Status: DC

## 2015-09-10 MED ORDER — INSULIN REGULAR HUMAN 100 UNIT/ML IJ SOLN: INTRAMUSCULAR | Status: DC

## 2015-09-10 NOTE — Patient Instructions (Addendum)
check your blood sugar twice a day.  vary the time of day when you check, between before the 3 meals, and at bedtime.  also check if you have symptoms of your blood sugar being too high or too low.  please keep a record of the readings and bring it to your next appointment here (or you can bring the meter itself).  You can write it on any piece of paper.  please call us sooner if your blood sugar goes below 70, or if you have a lot of readings over 200. Please reduce the NPH to 5 units at bedtime, and:  Increase the reg insulin to 3 times a day (just before each meal): 01-10-29 units.   Please come back for a follow-up appointment in 3 months.

## 2015-09-10 NOTE — Progress Notes (Signed)
Subjective:    Patient ID: Max [name], male    DOB: Jun 05, 1945, 70 y.o.   MRN: [id number]  HPI Pt returns for f/u of diabetes mellitus: DM type: Insulin-requiring type 2 Dx'ed: [id number] Complications: none Therapy: insulin since 2004 DKA: never Severe hypoglycemia: never Pancreatitis: never Other: he takes multiple daily injections; he declines V-GO.  Interval history: no cbg record, but states cbg's vary from 78-250.  It was lowest fasting, and highest at hs.  Past Medical History  Diagnosis Date  . Hypertension   . OSA (obstructive sleep apnea)   . DVT (deep venous thrombosis) ([place])   . Dyslipidemia     Past Surgical History  Procedure Laterality Date  . Back surgery      Social History   Social History  . Marital Status: Married    Spouse Name: N/A  . Number of Children: N/A  . Years of Education: N/A   Occupational History  . Not on file.   Social History Main Topics  . Smoking status: Never Smoker   . Smokeless tobacco: Never Used  . Alcohol Use: No  . Drug Use: No  . Sexual Activity: Not on file   Other Topics Concern  . Not on file   Social History Narrative    Current Outpatient Prescriptions on File Prior to Visit  Medication Sig Dispense Refill  . albuterol (PROAIR HFA) 108 (90 BASE) MCG/ACT inhaler Inhale 2 puffs into the lungs 4 (four) times daily as needed for shortness of breath. 1 Inhaler 12  . amLODipine (NORVASC) 10 MG tablet     . baclofen (LIORESAL) 10 MG tablet Take 10 mg by mouth 3 (three) times daily. Reported on 08/12/2015    . Fluticasone Furoate-Vilanterol (BREO ELLIPTA) 100-25 MCG/INH AEPB Inhale 1 puff then rinse mouth, once daily 1 each 12  . hydrochlorothiazide (HYDRODIURIL) 25 MG tablet Take 25 mg by mouth daily.    . hydrOXYzine (VISTARIL) 25 MG capsule Take 25 mg by mouth 3 (three) times daily as needed. Reported on 08/12/2015    . losartan (COZAAR) 100 MG tablet Take 100 mg by mouth daily.    Marland Kitchen oxyCODONE (OXYCONTIN) 20  MG 12 hr tablet Take 20 mg by mouth every 12 (twelve) hours.    Marland Kitchen oxyCODONE (ROXICODONE) 15 MG immediate release tablet Take 15 mg by mouth 3 (three) times daily.    Marland Kitchen PRAVASTATIN SODIUM PO Take 2 tablets by mouth at bedtime.    . tamsulosin (FLOMAX) 0.4 MG CAPS capsule Take 0.4 mg by mouth.     No current facility-administered medications on file prior to visit.    Allergies  Allergen Reactions  . Butane     High anxiety and hallucinations  . Topamax [Topiramate]     Hallucinations   . Buprenorphine Other (See Comments)  . Gabapentin Other (See Comments)    Causes agression  . Hydromorphone Swelling  . Tizanidine Swelling    Family History  Problem Relation Age of Onset  . Diabetes Mother   . Diabetes Brother     BP 130/88 mmHg  Pulse 90  Ht 5\' 11"  (1.803 m)  Wt 268 lb (121.564 kg)  BMI 37.39 kg/m2  SpO2 96%  Review of Systems He denies hypoglycemia    Objective:   Physical Exam VITAL SIGNS:  See vs page GENERAL: no distress Pulses: dorsalis pedis intact bilat.   MSK: no deformity of the feet CV: trace bilat leg edema Skin:  no ulcer on the feet.  normal color and temp on the feet. Neuro: sensation is intact to touch on the feet Ext: There is bilateral onychomycosis of the toenails.       Assessment & Plan:  Insulin-requiring type 2 DM: The pattern of his cbg's indicates he needs some adjustment in his therapy.    Patient is advised the following: Patient Instructions  check your blood sugar twice a day.  vary the time of day when you check, between before the 3 meals, and at bedtime.  also check if you have symptoms of your blood sugar being too high or too low.  please keep a record of the readings and bring it to your next appointment here (or you can bring the meter itself).  You can write it on any piece of paper.  please call us sooner if your blood sugar goes below 70, or if you have a lot of readings over 200. Please reduce the NPH to 5 units at  bedtime, and:  Increase the reg insulin to 3 times a day (just before each meal): 01-10-29 units.   Please come back for a follow-up appointment in 3 months.      Renato Shin, MD

## 2015-12-10 ENCOUNTER — Ambulatory Visit: Payer: Medicare Other | Admitting: Endocrinology

## 2015-12-10 DIAGNOSIS — Z0289 Encounter for other administrative examinations: Secondary | ICD-10-CM

## 2016-03-01 ENCOUNTER — Other Ambulatory Visit: Payer: Self-pay | Admitting: Endocrinology

## 2016-03-01 NOTE — Telephone Encounter (Signed)
Please refill x 3 mos Ov is due 

## 2016-03-04 ENCOUNTER — Ambulatory Visit: Payer: Self-pay | Admitting: Otolaryngology

## 2016-03-04 NOTE — H&P (Signed)
  Since I saw him last year, he has had intermittent right submandibular swelling which resolves quickly. About a month ago he developed severe swelling and pain that did not resolve. He got 2 rounds of antibiotics. He started having trouble on the left also. He is feeling much better today. His ears are clogged up. He has hearing aids.  Bilateral cerumen impaction cleaned out under the microscope. Otherwise ears are healthy. Oral cavity and pharynx are clear. There is a couple of small stones palpable on both sides of the floor of mouth. The right gland is firm and enlarged but nontender or fluctuant.  CT a year and a half ago revealed small bilateral ductal stones.  Procedure note:  Indications: Cerumen impaction  Details of cerumen removal were discussed with the patient and all questions were answered.  Procedure:  Using the operating microscope, both sides were cleaned of cerumen using suction and otologic forceps. There was no signs of infection. There was part of a Q-tip in the right ear canal that was also removed. Symptoms were releaved.  He tolerated this procedure well. There were no complications.  Avoid Q-tips. Recommend we repeat CT imaging to identify the extent of the submandibular stones and then plan surgical treatment following that.

## 2016-03-05 ENCOUNTER — Ambulatory Visit (INDEPENDENT_AMBULATORY_CARE_PROVIDER_SITE_OTHER): Payer: Medicare Other | Admitting: Internal Medicine

## 2016-03-05 ENCOUNTER — Encounter: Payer: Self-pay | Admitting: Internal Medicine

## 2016-03-05 VITALS — BP 128/78 | HR 88 | Ht 71.0 in | Wt 274.2 lb

## 2016-03-05 DIAGNOSIS — G4733 Obstructive sleep apnea (adult) (pediatric): Secondary | ICD-10-CM | POA: Diagnosis not present

## 2016-03-05 DIAGNOSIS — J4520 Mild intermittent asthma, uncomplicated: Secondary | ICD-10-CM | POA: Diagnosis not present

## 2016-03-05 MED ORDER — ALBUTEROL SULFATE HFA 108 (90 BASE) MCG/ACT IN AERS: 2.0000 | INHALATION_SPRAY | Freq: Four times a day (QID) | RESPIRATORY_TRACT | 12 refills | Status: DC | PRN

## 2016-03-05 NOTE — Assessment & Plan Note (Signed)
He doesn't really explain why average daily CPAP uses reported as 12 hours. There is some breakthrough snoring and an AHI of 6.1 on his download. Plan-rays pressure to 13. Discussed mask choice and comfort.

## 2016-03-05 NOTE — Progress Notes (Signed)
HPI  male never smoker followed for OSA, complicated by DM  -------------------  03/06/2015-70 year old male never smoker followed for OSA, complicated by DM CPAP [id number] FOLLOWS FOR: pt. sates he wears CPAP 7-8 hrs everynigt. pressure is good. no supplies needed at this time. [id number]. no DL. Wife confirms no snoring. Uses nasal pillows mask with some drooling which he doesn't mind enough to change. He definitely feels better off with CPAP. Has not changed lifestyle enough to lose weight  03/05/2016-70 year old male never smoker followed for OSA, asthma, complicated by DM, hx DVT, HBP CPAP 12/ [facility] uses nightly, download done. Sleep-disordered by frequent nocturia-has urologist. Also chronic back pain. Pending ENT surgery for retained salivary stone under right mandible. Definitely sleeps better with CPAP. Nasal pillows mask. Wife notices he snores some occasionally if mask is displaced. Occasional dyspnea on exertion with wheeze. He expects to wheeze a little [place] [facility]. Infrequent use of rescue inhaler. Download shows 100% compliance with average use 12 hours/day (?), AHI 6.1/hour.  ROS-see HPI Constitutional:   No-   weight loss, night sweats, fevers, chills, fatigue, lassitude. HEENT:   No-  headaches, difficulty swallowing, tooth/dental problems, sore throat,       No-  sneezing, itching, ear ache, nasal congestion, post nasal drip,  CV:  No-   chest pain, orthopnea, PND, swelling in lower extremities, anasarca, dizziness, palpitations Resp: No-   shortness of breath with exertion or at rest.              No-   productive cough,  No non-productive cough,  No- coughing up of blood.              No-   change in color of mucus.  + wheezing.   Skin: No-   rash or lesions. GI:  No-   heartburn, indigestion, abdominal pain, nausea, vomiting,  GU: . MS:  No-   joint pain or swelling.  + back pain. Neuro-     nothing unusual Psych:  No-  change in mood or affect. No depression or anxiety.  No memory loss.  OBJ- Physical Exam General- Alert, Oriented, Affect-appropriate, Distress-mild due to back pain . + Overweight Skin- rash-none, lesions- none, excoriation- none Lymphadenopathy- none Head- atraumatic.             Eyes- Gross vision intact, PERRLA, conjunctivae and secretions clear            Ears- +Hearing aid/ HOH            Nose- Clear, no-Septal dev, mucus, polyps, erosion, perforation             Throat- Mallampati III , mucosa clear , drainage- none, tonsils- atrophic. +Dentures Neck- flexible , trachea midline, no stridor , thyroid nl, carotid no bruit Chest - symmetrical excursion , unlabored           Heart/CV- RRR , no murmur , no gallop  , no rub, nl s1 s2                           - JVD- none , edema- none, stasis changes- none, varices- none           Lung-  wheeze -none, cough- none , dullness-none, rub- none           Chest wall- + wearing a back brace Abd-  Br/ Gen/ Rectal- Not done, not indicated  Extrem- cyanosis- none, clubbing, none, atrophy- none, strength- nl Neuro-+ seems restless

## 2016-03-05 NOTE — Patient Instructions (Signed)
Order- DME [facility]- Please increase CPAP pressure to 13, continue mask of choice, humidifier, supplies, AirView  Dx OSA   I do recommend you get the flu shot now, from anywhere you wish. It takes two weeks to build antibodies after the flu shot, and it won't do you any good after the flu season is over.  Please call as needed

## 2016-03-05 NOTE — Assessment & Plan Note (Signed)
Clear now. He describes mild intermittent symptoms. Plan-refill rescue inhaler to keep it available.

## 2016-03-09 ENCOUNTER — Encounter (HOSPITAL_COMMUNITY): Payer: Self-pay

## 2016-03-09 ENCOUNTER — Encounter (HOSPITAL_COMMUNITY)
Admission: RE | Admit: 2016-03-09 | Discharge: 2016-03-09 | Disposition: A | Discharge status: Home / Self Care | Payer: Medicare Other | Source: Ambulatory Visit | Attending: Otolaryngology | Admitting: Otolaryngology

## 2016-03-09 DIAGNOSIS — K115 Sialolithiasis: Secondary | ICD-10-CM | POA: Diagnosis present

## 2016-03-09 DIAGNOSIS — Z79899 Other long term (current) drug therapy: Secondary | ICD-10-CM | POA: Diagnosis not present

## 2016-03-09 DIAGNOSIS — I10 Essential (primary) hypertension: Secondary | ICD-10-CM | POA: Diagnosis not present

## 2016-03-09 DIAGNOSIS — E119 Type 2 diabetes mellitus without complications: Secondary | ICD-10-CM | POA: Diagnosis not present

## 2016-03-09 DIAGNOSIS — G4730 Sleep apnea, unspecified: Secondary | ICD-10-CM | POA: Diagnosis not present

## 2016-03-09 DIAGNOSIS — Z794 Long term (current) use of insulin: Secondary | ICD-10-CM | POA: Diagnosis not present

## 2016-03-09 HISTORY — DX: Anesthesia of skin: R20.2

## 2016-03-09 HISTORY — DX: Depression, unspecified: F32.A

## 2016-03-09 HISTORY — DX: Anxiety disorder, unspecified: F41.9

## 2016-03-09 HISTORY — DX: Dyspnea, unspecified: R06.00

## 2016-03-09 HISTORY — DX: Other chronic pain: G89.29

## 2016-03-09 HISTORY — DX: Anesthesia of skin: R20.0

## 2016-03-09 HISTORY — DX: Type 2 diabetes mellitus without complications: E11.9

## 2016-03-09 HISTORY — DX: Unspecified hearing loss, unspecified ear: H91.90

## 2016-03-09 HISTORY — DX: Presence of spectacles and contact lenses: Z97.3

## 2016-03-09 HISTORY — DX: Major depressive disorder, single episode, unspecified: F32.9

## 2016-03-09 HISTORY — DX: Frequency of micturition: R35.0

## 2016-03-09 LAB — CBC
HCT: 41.5 % (ref 39.0–52.0)
Hemoglobin: 13.8 g/dL (ref 13.0–17.0)
MCH: 26.7 pg (ref 26.0–34.0)
MCHC: 33.3 g/dL (ref 30.0–36.0)
MCV: 80.3 fL (ref 78.0–100.0)
Platelets: 291 10*3/uL (ref 150–400)
RBC: 5.17 MIL/uL (ref 4.22–5.81)
RDW: 14.4 % (ref 11.5–15.5)
WBC: 11.6 10*3/uL — ABNORMAL HIGH (ref 4.0–10.5)

## 2016-03-09 LAB — BASIC METABOLIC PANEL
Anion gap: 11 (ref 5–15)
BUN: 9 mg/dL (ref 6–20)
CO2: 26 mmol/L (ref 22–32)
Calcium: 9.9 mg/dL (ref 8.9–10.3)
Chloride: 101 mmol/L (ref 101–111)
Creatinine, Ser: 0.98 mg/dL (ref 0.61–1.24)
GFR calc Af Amer: >60 mL/min (ref >60)
GFR calc non Af Amer: >60 mL/min (ref >60)
Glucose, Bld: 208 mg/dL — ABNORMAL HIGH (ref 65–99)
Potassium: 4.2 mmol/L (ref 3.5–5.1)
Sodium: 138 mmol/L (ref 135–145)

## 2016-03-09 LAB — GLUCOSE, CAPILLARY: Glucose-Capillary: 222 mg/dL — ABNORMAL HIGH (ref 65–99)

## 2016-03-09 NOTE — Progress Notes (Signed)
Anesthesia Chart Review: Patient is intraoral submandibular stone removal on 03/10/16 by Dr. Constance Holster.  History includes non-smoker, HTN, post-op RLE DVT 03/13/03, dyslipidemia, OSA (CPAP), DM2, chronic pain, exertional dyspnea, hard of hearing, depression, anxiety, right L4-5 hemilaminotomy 01/10/03.   PCP is Dr. Antony Blackbird at [facility]. Endocrinologist is Dr. Renato Shin.  Pulmonologist is Dr. Baird Lyons.  Meds include albuterol, amlodipine, Cymbalta, Breo Ellipta, HCTZ, Novolin NPH, losartan, Novolin R, oxycodone, pravastatin, Flomax, Oxy IR. His PAT RN reports that patient does not always take his insulin regimen at the doses prescribed.   EKG 03/09/16: NSR.  Preoperative labs noted. A1c is in process (last 7.9 on 08/12/15). He reports his fasting CBGs run ~ 120's-150's.   If fasting CBG results acceptable on the day of surgery and otherwise no acute changes then I would anticipate that he can proceed as planned.  George Hugh [facility] Short Stay Center/Anesthesiology Phone [phone] 03/09/2016 3:44 PM

## 2016-03-09 NOTE — Progress Notes (Signed)
PCP - Dr. Antony Blackbird at [facility]     -pt. States that he will now be going to [facility] on [address] but has not been to that practice yet  Cardiologist - denies Endocrinologist - Dr. Renato Shin  - last seen June 2017 per patient  EKG - 03/09/16 CXR - denies Echo/stress test/cardiac cath - denies  Patient denies chest pain and shortness of breath at PAT appointment.  Patient states that he checks his blood sugar 3-4 times a day and that his fasting glucose is 120-150.  Patient also informed nurse that he is taking 20 units of Novolin N at bedtime (Dr. Cordelia Pen note states that he should be taking 5 units at bedtime) and 15 units of Novolin R 3 times a day (Dr. Cordelia Pen note states that he should be taking 01-10-29 units).  Diabetes coordinator Ashby Dawes), contacted during PAT appointment and instructed that patient should take 1/2 of what he normally takes at bedtime.  Patient given instructions on how to treat a blood sugar the morning of surgery that is less than 70, patient and wife verbalized understanding.

## 2016-03-09 NOTE — Pre-Procedure Instructions (Signed)
Max [name]  03/09/2016      [place], [place] - [address] [place] [address] Phone: [phone] Fax: [phone]  [place], [place] - [address] [place] [address] Phone: [phone] Fax: [phone]  [id number] [place], [place] [address] [place] [address] Phone: [phone] Fax: [phone]    Your procedure is scheduled on Wednesday, December 13th, 2017.  Report to [facility] Admitting at 10:30 A.M.   Call this number if you have problems the morning of surgery:  [phone]   Remember:  Do not eat food or drink liquids after midnight.   Take these medicines the morning of surgery with A SIP OF WATER: Albuterol Inhaler if needed (please bring with you), Amlodipine (Norvasc),  Breo Inahler,  Oxycodone (Oxycontin) or Oxycodone (Roxicodone) if needed, Tamsulosin (Flomax), Cymbalta.  Stop taking: Meloxicam (Mobic), Aspirin, NSAIDS, Aleve, Naproxen, Ibuprofen, Advil, Motrin, BC's, Goody's, Fish oil, all herbal medications and all vitamins.    WHAT DO I DO ABOUT MY DIABETES MEDICATION?  . THE NIGHT BEFORE SURGERY, take 10 units of Novolin N insulin.      . THE MORNING OF SURGERY, take 0 units of Novolin R (regular) insulin.    How to Manage Your Diabetes Before and After Surgery  Why is it important to control my blood sugar before and after surgery? . Improving blood sugar levels before and after surgery helps healing and can limit problems. . A way of improving blood sugar control is eating a healthy diet by: o  Eating less sugar and carbohydrates o  Increasing activity/exercise o  Talking with your doctor about reaching your blood sugar goals . High blood sugars (greater than 180 mg/dL) can raise your risk of infections and slow your recovery, so you will need to  focus on controlling your diabetes during the weeks before surgery. . Make sure that the doctor who takes care of your diabetes knows about your planned surgery including the date and location.  How do I manage my blood sugar before surgery? . Check your blood sugar at least 4 times a day, starting 2 days before surgery, to make sure that the level is not too high or low. o Check your blood sugar the morning of your surgery when you wake up and every 2 hours until you get to the Short Stay unit. . If your blood sugar is less than 70 mg/dL, you will need to treat for low blood sugar: o Do not take insulin. o Treat a low blood sugar (less than 70 mg/dL) with  cup of clear juice (cranberry or apple), 4 glucose tablets, OR glucose gel. o Recheck blood sugar in 15 minutes after treatment (to make sure it is greater than 70 mg/dL). If your blood sugar is not greater than 70 mg/dL on recheck, call [phone] for further instructions. . Report your blood sugar to the short stay nurse when you get to Short Stay.  . If you are admitted to the hospital after surgery: o Your blood sugar will be checked by the staff and you will probably be given insulin after surgery (instead of oral diabetes medicines) to make sure you have good blood sugar levels. o The goal for blood sugar control after surgery is 80-180 mg/dL.    Do not wear jewelry.  Do not wear lotions,  powders, or colognes, or deoderant.  Men may shave face and neck.  Do not bring valuables to the hospital.  [facility] is not responsible for any belongings or valuables.  Contacts, dentures or bridgework may not be worn into surgery.  Leave your suitcase in the car.  After surgery it may be brought to your room.  For patients admitted to the hospital, discharge time will be determined by your treatment team.  Patients discharged the day of surgery will not be allowed to drive home.   Special instructions:  Preparing for Surgery   [facility]- Preparing For Surgery  Before surgery, you can play an important role. Because skin is not sterile, your skin needs to be as free of germs as possible. You can reduce the number of germs on your skin by washing with CHG (chlorahexidine gluconate) Soap before surgery.  CHG is an antiseptic cleaner which kills germs and bonds with the skin to continue killing germs even after washing.  Please do not use if you have an allergy to CHG or antibacterial soaps. If your skin becomes reddened/irritated stop using the CHG.  Do not shave (including legs and underarms) for at least 48 hours prior to first CHG shower. It is OK to shave your face.  Please follow these instructions carefully.   1. Shower the NIGHT BEFORE SURGERY and the MORNING OF SURGERY with CHG.   2. If you chose to wash your hair, wash your hair first as usual with your normal shampoo.  3. After you shampoo, rinse your hair and body thoroughly to remove the shampoo.  4. Use CHG as you would any other liquid soap. You can apply CHG directly to the skin and wash gently with a scrungie or a clean washcloth.   5. Apply the CHG Soap to your body ONLY FROM THE NECK DOWN.  Do not use on open wounds or open sores. Avoid contact with your eyes, ears, mouth and genitals (private parts). Wash genitals (private parts) with your normal soap.  6. Wash thoroughly, paying special attention to the area where your surgery will be performed.  7. Thoroughly rinse your body with warm water from the neck down.  8. DO NOT shower/wash with your normal soap after using and rinsing off the CHG Soap.  9. Pat yourself dry with a CLEAN TOWEL.   10. Wear CLEAN PAJAMAS   11. Place CLEAN SHEETS on your bed the night of your first shower and DO NOT SLEEP WITH PETS.  Day of Surgery: Do not apply any deodorants/lotions. Please wear clean clothes to the hospital/surgery center.     Please read over the following fact sheets that you were given.  o

## 2016-03-10 ENCOUNTER — Ambulatory Visit (HOSPITAL_COMMUNITY): Payer: Medicare Other | Admitting: Anesthesiology

## 2016-03-10 ENCOUNTER — Encounter (HOSPITAL_COMMUNITY): Payer: Self-pay | Admitting: *Deleted

## 2016-03-10 ENCOUNTER — Ambulatory Visit (HOSPITAL_COMMUNITY)
Admission: RE | Admit: 2016-03-10 | Discharge: 2016-03-10 | Disposition: A | Discharge status: Home / Self Care | Payer: Medicare Other | Source: Ambulatory Visit | Attending: Otolaryngology | Admitting: Otolaryngology

## 2016-03-10 ENCOUNTER — Ambulatory Visit (HOSPITAL_COMMUNITY): Payer: Medicare Other | Admitting: Vascular Surgery

## 2016-03-10 ENCOUNTER — Encounter (HOSPITAL_COMMUNITY)
Admission: RE | Disposition: A | Discharge status: Home / Self Care | Payer: Self-pay | Source: Ambulatory Visit | Attending: Otolaryngology

## 2016-03-10 DIAGNOSIS — K115 Sialolithiasis: Secondary | ICD-10-CM | POA: Diagnosis not present

## 2016-03-10 DIAGNOSIS — G4730 Sleep apnea, unspecified: Secondary | ICD-10-CM | POA: Insufficient documentation

## 2016-03-10 DIAGNOSIS — Z79899 Other long term (current) drug therapy: Secondary | ICD-10-CM | POA: Insufficient documentation

## 2016-03-10 DIAGNOSIS — I10 Essential (primary) hypertension: Secondary | ICD-10-CM | POA: Diagnosis not present

## 2016-03-10 DIAGNOSIS — Z794 Long term (current) use of insulin: Secondary | ICD-10-CM | POA: Insufficient documentation

## 2016-03-10 DIAGNOSIS — E119 Type 2 diabetes mellitus without complications: Secondary | ICD-10-CM | POA: Diagnosis not present

## 2016-03-10 HISTORY — PX: SUBMANDIBULAR GLAND EXCISION: SHX2456

## 2016-03-10 LAB — GLUCOSE, CAPILLARY
Glucose-Capillary: 204 mg/dL — ABNORMAL HIGH (ref 65–99)
Glucose-Capillary: 160 mg/dL — ABNORMAL HIGH (ref 65–99)

## 2016-03-10 LAB — HEMOGLOBIN A1C
Hgb A1c MFr Bld: 7.2 % — ABNORMAL HIGH (ref 4.8–5.6)
Mean Plasma Glucose: 160 mg/dL

## 2016-03-10 SURGERY — EXCISION, SUBMANDIBULAR GLAND
Anesthesia: General | Site: Mouth | Laterality: Right

## 2016-03-10 MED ORDER — CEPHALEXIN 500 MG PO CAPS: 500.0000 mg | ORAL_CAPSULE | Freq: Three times a day (TID) | ORAL | 0 refills | Status: DC

## 2016-03-10 MED ORDER — BACITRACIN ZINC 500 UNIT/GM EX OINT
TOPICAL_OINTMENT | CUTANEOUS | Status: AC
  Filled 2016-03-10: qty 28.35

## 2016-03-10 MED ORDER — PROPOFOL 10 MG/ML IV BOLUS
INTRAVENOUS | Status: AC
  Filled 2016-03-10: qty 20

## 2016-03-10 MED ORDER — ROCURONIUM BROMIDE 50 MG/5ML IV SOSY
PREFILLED_SYRINGE | INTRAVENOUS | Status: AC
  Filled 2016-03-10: qty 10

## 2016-03-10 MED ORDER — LIDOCAINE 2% (20 MG/ML) 5 ML SYRINGE
INTRAMUSCULAR | Status: AC
  Filled 2016-03-10: qty 10

## 2016-03-10 MED ORDER — MEPERIDINE HCL 25 MG/ML IJ SOLN: 6.2500 mg | INTRAMUSCULAR | Status: DC | PRN

## 2016-03-10 MED ORDER — FENTANYL CITRATE (PF) 100 MCG/2ML IJ SOLN
INTRAMUSCULAR | Status: DC | PRN
  Administered 2016-03-10 (×2): 100 ug via INTRAVENOUS

## 2016-03-10 MED ORDER — PROPOFOL 10 MG/ML IV BOLUS
INTRAVENOUS | Status: DC | PRN
  Administered 2016-03-10: 30 mg via INTRAVENOUS
  Administered 2016-03-10: 200 mg via INTRAVENOUS

## 2016-03-10 MED ORDER — LIDOCAINE HCL (CARDIAC) 20 MG/ML IV SOLN
INTRAVENOUS | Status: DC | PRN
  Administered 2016-03-10: 100 mg via INTRAVENOUS

## 2016-03-10 MED ORDER — SUCCINYLCHOLINE CHLORIDE 20 MG/ML IJ SOLN
INTRAMUSCULAR | Status: DC | PRN
  Administered 2016-03-10: 100 mg via INTRAVENOUS

## 2016-03-10 MED ORDER — SUCCINYLCHOLINE CHLORIDE 200 MG/10ML IV SOSY
PREFILLED_SYRINGE | INTRAVENOUS | Status: AC
  Filled 2016-03-10: qty 10

## 2016-03-10 MED ORDER — NEOSTIGMINE METHYLSULFATE 10 MG/10ML IV SOLN
INTRAVENOUS | Status: DC | PRN
  Administered 2016-03-10: 4 mg via INTRAVENOUS

## 2016-03-10 MED ORDER — ROCURONIUM BROMIDE 50 MG/5ML IV SOSY
PREFILLED_SYRINGE | INTRAVENOUS | Status: AC
  Filled 2016-03-10: qty 5

## 2016-03-10 MED ORDER — METOCLOPRAMIDE HCL 5 MG/ML IJ SOLN: 10.0000 mg | Freq: Once | INTRAMUSCULAR | Status: DC | PRN

## 2016-03-10 MED ORDER — DEXAMETHASONE SODIUM PHOSPHATE 10 MG/ML IJ SOLN
INTRAMUSCULAR | Status: AC
  Filled 2016-03-10: qty 1

## 2016-03-10 MED ORDER — NEOSTIGMINE METHYLSULFATE 5 MG/5ML IV SOSY
PREFILLED_SYRINGE | INTRAVENOUS | Status: AC
  Filled 2016-03-10: qty 5

## 2016-03-10 MED ORDER — FENTANYL CITRATE (PF) 100 MCG/2ML IJ SOLN
INTRAMUSCULAR | Status: AC
  Filled 2016-03-10: qty 4

## 2016-03-10 MED ORDER — ONDANSETRON HCL 4 MG/2ML IJ SOLN
INTRAMUSCULAR | Status: AC
  Filled 2016-03-10: qty 2

## 2016-03-10 MED ORDER — GLYCOPYRROLATE 0.2 MG/ML IJ SOLN
INTRAMUSCULAR | Status: DC | PRN
  Administered 2016-03-10: 0.6 mg via INTRAVENOUS

## 2016-03-10 MED ORDER — LACTATED RINGERS IV SOLN
INTRAVENOUS | Status: DC
  Administered 2016-03-10 (×3): via INTRAVENOUS

## 2016-03-10 MED ORDER — LIDOCAINE 2% (20 MG/ML) 5 ML SYRINGE
INTRAMUSCULAR | Status: AC
  Filled 2016-03-10: qty 5

## 2016-03-10 MED ORDER — 0.9 % SODIUM CHLORIDE (POUR BTL) OPTIME
TOPICAL | Status: DC | PRN
  Administered 2016-03-10: 1000 mL

## 2016-03-10 MED ORDER — LIDOCAINE-EPINEPHRINE (PF) 1 %-1:200000 IJ SOLN
INTRAMUSCULAR | Status: DC | PRN
  Administered 2016-03-10: 1 mL

## 2016-03-10 MED ORDER — ROCURONIUM BROMIDE 100 MG/10ML IV SOLN
INTRAVENOUS | Status: DC | PRN
  Administered 2016-03-10: 25 mg via INTRAVENOUS

## 2016-03-10 MED ORDER — FENTANYL CITRATE (PF) 100 MCG/2ML IJ SOLN: 25.0000 ug | INTRAMUSCULAR | Status: DC | PRN

## 2016-03-10 MED ORDER — LIDOCAINE-EPINEPHRINE (PF) 1 %-1:200000 IJ SOLN
INTRAMUSCULAR | Status: AC
  Filled 2016-03-10: qty 30

## 2016-03-10 MED ORDER — ONDANSETRON HCL 4 MG/2ML IJ SOLN
INTRAMUSCULAR | Status: DC | PRN
  Administered 2016-03-10: 4 mg via INTRAVENOUS

## 2016-03-10 MED ORDER — MIDAZOLAM HCL 2 MG/2ML IJ SOLN
INTRAMUSCULAR | Status: AC
  Filled 2016-03-10: qty 2

## 2016-03-10 SURGICAL SUPPLY — 50 items
ADH SKN CLS APL DERMABOND .7 (GAUZE/BANDAGES/DRESSINGS) ×1
ATTRACTOMAT 16X20 MAGNETIC DRP (DRAPES) IMPLANT
BLADE SURG 15 STRL LF DISP TIS (BLADE) IMPLANT
BLADE SURG 15 STRL SS (BLADE)
CANISTER SUCTION 2500CC (MISCELLANEOUS) ×2 IMPLANT
CLEANER TIP ELECTROSURG 2X2 (MISCELLANEOUS) ×2 IMPLANT
CONT SPEC 4OZ CLIKSEAL STRL BL (MISCELLANEOUS) ×2 IMPLANT
CORDS BIPOLAR (ELECTRODE) ×2 IMPLANT
COVER SURGICAL LIGHT HANDLE (MISCELLANEOUS) ×2 IMPLANT
DERMABOND ADVANCED (GAUZE/BANDAGES/DRESSINGS) ×1
DERMABOND ADVANCED .7 DNX12 (GAUZE/BANDAGES/DRESSINGS) ×1 IMPLANT
DRAIN PENROSE 1/4X12 LTX STRL (WOUND CARE) ×2 IMPLANT
DRAIN SNY 10 ROU (WOUND CARE) IMPLANT
DRAPE PROXIMA HALF (DRAPES) IMPLANT
DRAPE SURG 17X23 STRL (DRAPES) ×2 IMPLANT
ELECT COATED BLADE 2.86 ST (ELECTRODE) ×2 IMPLANT
ELECT REM PT RETURN 9FT ADLT (ELECTROSURGICAL) ×2
ELECTRODE REM PT RTRN 9FT ADLT (ELECTROSURGICAL) ×1 IMPLANT
EVACUATOR SILICONE 100CC (DRAIN) IMPLANT
FORCEPS BIPOLAR SPETZLER 8 1.0 (NEUROSURGERY SUPPLIES) ×2 IMPLANT
GLOVE ECLIPSE 7.5 STRL STRAW (GLOVE) ×2 IMPLANT
GLOVE SURG SS PI 7.5 STRL IVOR (GLOVE) ×1 IMPLANT
GOWN STRL REUS W/ TWL LRG LVL3 (GOWN DISPOSABLE) ×2 IMPLANT
GOWN STRL REUS W/TWL LRG LVL3 (GOWN DISPOSABLE) ×4
KIT BASIN OR (CUSTOM PROCEDURE TRAY) ×2 IMPLANT
KIT ROOM TURNOVER OR (KITS) ×2 IMPLANT
LOCATOR NERVE 3 VOLT (DISPOSABLE) IMPLANT
NEEDLE PRECISIONGLIDE 27X1.5 (NEEDLE) IMPLANT
NS IRRIG 1000ML POUR BTL (IV SOLUTION) ×2 IMPLANT
PAD ARMBOARD 7.5X6 YLW CONV (MISCELLANEOUS) ×4 IMPLANT
PENCIL FOOT CONTROL (ELECTRODE) ×2 IMPLANT
SHEARS HARMONIC 9CM CVD (BLADE) IMPLANT
SOLUTION BETADINE 4OZ (MISCELLANEOUS) ×1 IMPLANT
SPECIMEN JAR SMALL (MISCELLANEOUS) ×2 IMPLANT
STAPLER VISISTAT 35W (STAPLE) ×2 IMPLANT
SUT CHROMIC 3 0 PS 2 (SUTURE) IMPLANT
SUT CHROMIC 4 0 PS 2 18 (SUTURE) IMPLANT
SUT CHROMIC 5 0 P 3 (SUTURE) ×2 IMPLANT
SUT ETHILON 2 0 FS 18 (SUTURE) IMPLANT
SUT ETHILON 3 0 PS 1 (SUTURE) IMPLANT
SUT ETHILON 5 0 P 3 18 (SUTURE)
SUT NYLON ETHILON 5-0 P-3 1X18 (SUTURE) IMPLANT
SUT SILK 2 0 REEL (SUTURE) IMPLANT
SUT SILK 2 0 SH CR/8 (SUTURE) IMPLANT
SUT SILK 3 0 REEL (SUTURE) ×2 IMPLANT
SUT SILK 4 0 TIES 17X18 (SUTURE) ×2 IMPLANT
SUT VIC AB 3-0 FS2 27 (SUTURE) IMPLANT
SUT VICRYL 4-0 PS2 18IN ABS (SUTURE) IMPLANT
TRAY ENT MC OR (CUSTOM PROCEDURE TRAY) ×2 IMPLANT
WATER STERILE IRR 1000ML POUR (IV SOLUTION) ×2 IMPLANT

## 2016-03-10 NOTE — Anesthesia Preprocedure Evaluation (Signed)
Anesthesia Evaluation  Patient identified by MRN, date of birth, ID band Patient awake    Reviewed: Allergy & Precautions, NPO status , Patient's Chart, lab work & pertinent test results  Airway Mallampati: II  TM Distance: >3 FB Neck ROM: Full    Dental no notable dental hx. (+) Upper Dentures, Lower Dentures   Pulmonary sleep apnea ,    Pulmonary exam normal breath sounds clear to auscultation       Cardiovascular hypertension, Pt. on medications + DVT (2004)  Normal cardiovascular exam Rhythm:Regular Rate:Normal     Neuro/Psych negative neurological ROS  negative psych ROS   GI/Hepatic negative GI ROS, Neg liver ROS,   Endo/Other  diabetes, Well Controlled, Type 2, Insulin Dependent, Oral Hypoglycemic Agents  Renal/GU negative Renal ROS  negative genitourinary   Musculoskeletal negative musculoskeletal ROS (+)   Abdominal   Peds negative pediatric ROS (+)  Hematology negative hematology ROS (+)   Anesthesia Other Findings   Reproductive/Obstetrics negative OB ROS                             Anesthesia Physical Anesthesia Plan  ASA: III  Anesthesia Plan: General   Post-op Pain Management:    Induction: Intravenous  Airway Management Planned: Oral ETT  Additional Equipment:   Intra-op Plan:   Post-operative Plan: Extubation in OR  Informed Consent: I have reviewed the patients History and Physical, chart, labs and discussed the procedure including the risks, benefits and alternatives for the proposed anesthesia with the patient or authorized representative who has indicated his/her understanding and acceptance.   Dental advisory given  Plan Discussed with: CRNA  Anesthesia Plan Comments:         Anesthesia Quick Evaluation

## 2016-03-10 NOTE — H&P (View-Only) (Signed)
  Since I saw him last year, he has had intermittent right submandibular swelling which resolves quickly. About a month ago he developed severe swelling and pain that did not resolve. He got 2 rounds of antibiotics. He started having trouble on the left also. He is feeling much better today. His ears are clogged up. He has hearing aids.  Bilateral cerumen impaction cleaned out under the microscope. Otherwise ears are healthy. Oral cavity and pharynx are clear. There is a couple of small stones palpable on both sides of the floor of mouth. The right gland is firm and enlarged but nontender or fluctuant.  CT a year and a half ago revealed small bilateral ductal stones.  Procedure note:  Indications: Cerumen impaction  Details of cerumen removal were discussed with the patient and all questions were answered.  Procedure:  Using the operating microscope, both sides were cleaned of cerumen using suction and otologic forceps. There was no signs of infection. There was part of a Q-tip in the right ear canal that was also removed. Symptoms were releaved.  He tolerated this procedure well. There were no complications.  Avoid Q-tips. Recommend we repeat CT imaging to identify the extent of the submandibular stones and then plan surgical treatment following that.

## 2016-03-10 NOTE — Anesthesia Postprocedure Evaluation (Signed)
Anesthesia Post Note  Patient: Max [name]  Procedure(s) Performed: Procedure(s) (LRB): INTRAORAL  SUBMANDIBULAR  STONE REMOVAL (Right)  Patient location during evaluation: PACU Anesthesia Type: General Level of consciousness: awake and alert Pain management: pain level controlled Vital Signs Assessment: post-procedure vital signs reviewed and stable Respiratory status: spontaneous breathing, nonlabored ventilation, respiratory function stable and patient connected to nasal cannula oxygen Cardiovascular status: blood pressure returned to baseline and stable Postop Assessment: no signs of nausea or vomiting Anesthetic complications: no    Last Vitals:  Vitals:   03/10/16 1415 03/10/16 1416  BP:  (!) 148/73  Pulse: 77 79  Resp: 14 16  Temp:      Last Pain:  Vitals:   03/10/16 1416  TempSrc:   PainSc: 0-No pain                 Montez Hageman

## 2016-03-10 NOTE — Transfer of Care (Signed)
Immediate Anesthesia Transfer of Care Note  Patient: Max [name]  Procedure(s) Performed: Procedure(s): INTRAORAL  SUBMANDIBULAR  STONE REMOVAL (Right)  Patient Location: PACU  Anesthesia Type:General  Level of Consciousness: patient cooperative and responds to stimulation  Airway & Oxygen Therapy: Patient Spontanous Breathing and Patient connected to face mask oxygen  Post-op Assessment: Report given to RN, Post -op Vital signs reviewed and stable and Patient moving all extremities X 4  Post vital signs: Reviewed and stable  Last Vitals:  Vitals:   03/10/16 1318 03/10/16 1319  BP:  122/73  Pulse: 85 83  Resp:    Temp:      Last Pain:  Vitals:   03/10/16 1101  TempSrc:   PainSc: 5       Patients Stated Pain Goal: 5 ([id number])  Complications: No apparent anesthesia complications

## 2016-03-10 NOTE — Op Note (Signed)
OPERATIVE REPORT  DATE OF SURGERY: 03/10/2016  PATIENT:  Max [name],  70 y.o. male  PRE-OPERATIVE DIAGNOSIS:  SUBMANDIBLE STONE  POST-OPERATIVE DIAGNOSIS:  SUBMANDIBLE STONE  PROCEDURE:  Procedure(s): INTRAORAL  SUBMANDIBULAR  STONE REMOVAL  SURGEON:  Beckie Salts, MD  ASSISTANTS: None  ANESTHESIA:   General   EBL:  20 ml  DRAINS: None  LOCAL MEDICATIONS USED:  1% Xylocaine with epinephrine  SPECIMEN:  Right submandibular Stone  COUNTS:  Correct  PROCEDURE DETAILS: The patient was taken to the operating room and placed on the operating table in the supine position. Following induction of general endotracheal anesthesia, the patient was draped in a standard fashion. A bite-block was used on the left side. Lip retractors were used. The right side floor of mouth was inspected. The submandibular puncta was identified and was cannulated with lacrimal probes, and serially dilated up to the largest size. Floor of mouth mucosa was incised using electrocautery down to the probe exposing the proximal duct. Palpation of the gland and compression produced thick purulent secretions and a calculus, oval shaped, approximately 5 mm in longest dimension. This was sent for pathologic evaluation. Persistent probing and inspection did not reveal any additional calculus. Patient was awakened from anesthesia, extubated and transferred to recovery in stable condition.    PATIENT DISPOSITION:  To PACU, stable

## 2016-03-10 NOTE — Discharge Instructions (Signed)
Resume a normal diet. Rinse mouth with saltwater 3 times daily for 5 days.

## 2016-03-10 NOTE — Interval H&P Note (Signed)
History and Physical Interval Note:  03/10/2016 11:47 AM  Max [name]  has presented today for surgery, with the diagnosis of SUBMANDIBLE STONE  The various methods of treatment have been discussed with the patient and family. After consideration of risks, benefits and other options for treatment, the patient has consented to  Procedure(s): INTRAORAL  SUBMANDIBULAR  STONE REMOVAL (N/A) as a surgical intervention .  The patient's history has been reviewed, patient examined, no change in status, stable for surgery.  I have reviewed the patient's chart and labs.  Questions were answered to the patient's satisfaction.     Arleigh Odowd

## 2016-03-11 ENCOUNTER — Encounter (HOSPITAL_COMMUNITY): Payer: Self-pay | Admitting: Otolaryngology

## 2016-03-11 ENCOUNTER — Encounter: Payer: Self-pay | Admitting: Internal Medicine

## 2016-03-18 LAB — STONE ANALYSIS
Ca phos cry stone ql IR: 100 %
Stone Weight KSTONE: 16 mg

## 2016-04-15 ENCOUNTER — Other Ambulatory Visit: Payer: Self-pay | Admitting: Endocrinology

## 2016-04-15 NOTE — Telephone Encounter (Signed)
Please refill x 3 mos Ov is due 

## 2016-05-14 ENCOUNTER — Other Ambulatory Visit: Payer: Self-pay

## 2016-05-14 MED ORDER — INSULIN REGULAR HUMAN 100 UNIT/ML IJ SOLN: INTRAMUSCULAR | 0 refills | Status: DC

## 2016-05-25 ENCOUNTER — Other Ambulatory Visit: Payer: Self-pay

## 2016-05-25 MED ORDER — INSULIN REGULAR HUMAN 100 UNIT/ML IJ SOLN: INTRAMUSCULAR | 0 refills | Status: DC

## 2016-06-07 ENCOUNTER — Encounter: Payer: Self-pay | Admitting: Internal Medicine

## 2016-09-07 ENCOUNTER — Other Ambulatory Visit: Payer: Self-pay | Admitting: Endocrinology

## 2017-01-18 ENCOUNTER — Encounter: Payer: Self-pay | Admitting: Internal Medicine

## 2017-01-19 ENCOUNTER — Telehealth: Payer: Self-pay | Admitting: Internal Medicine

## 2017-01-19 NOTE — Telephone Encounter (Signed)
Spoke with Jonni Sanger at [facility]. They stated that the patient is having several issues with his bipap machine. Jonni Sanger had the patient come in for a mask refitting session and noticed that he had a huge leak and his AHI numbers are high. Because of this, Jonni Sanger turned off the EPR. Mask is still leaking and AHI is still high. Jonni Sanger stated that the patient is in a pain management clinic and is taking oxycodone around the clock so he knows that will interfere with the AHI.   Jonni Sanger has attached a 1 week download to [place] for CY to review. Since the EPR has been turned off, he will have the patient stop by [facility] to get another download for CY to review.   Will go ahead and print the available download and place on CY's desk for review.

## 2017-01-26 NOTE — Telephone Encounter (Signed)
Awaiting CY response.

## 2017-01-26 NOTE — Telephone Encounter (Signed)
Criselda Peaches, wife, is calling to check on the status of problems with the bipap machine.  CB is [phone]

## 2017-01-27 NOTE — Telephone Encounter (Signed)
I thought from previous message that we were waiting on another download after DME addressed mask fit issues.

## 2017-01-27 NOTE — Telephone Encounter (Signed)
I called wife to discuss, no answer and unable to leave voicemail.

## 2017-02-01 NOTE — Telephone Encounter (Signed)
Will forward to KW per her request.

## 2017-02-04 NOTE — Telephone Encounter (Signed)
Spoke with Max [name] at [facility] has older machine and will not DL anymore; therefore he will need NPSG and then CPAP titration then BiPAP titration in order to get new machine.

## 2017-02-08 NOTE — Telephone Encounter (Signed)
CY please advise if ok to order.  Thanks.

## 2017-02-15 NOTE — Telephone Encounter (Signed)
CY - please advise. Thanks! 

## 2017-02-28 NOTE — Telephone Encounter (Signed)
Dr Annamaria Boots please advise if okay to order NPSG, then CPAP titration, then BiPAP titration in order to qualify pt for new machine - this is per [facility].  Unless you would like to address this at pt's upcoming appt with you on 12.10.18

## 2017-03-01 NOTE — Telephone Encounter (Signed)
Spoke with Jonni Sanger from [facility] stating that pt came in to [facility] to have machine looked at. Pt refused to have a full face mask. Jonni Sanger stated that pt said he is in pain management and stated unit was not working for him. Jonni Sanger stated that unit was working fine but pt stated biggest issue for him is pain management.  Jonni Sanger was able to get a download off pt's machine.  Download has been printed and been given to Dr. Annamaria Boots for him to review.  Nothing further needed at this time.

## 2017-03-01 NOTE — Telephone Encounter (Signed)
Ok to wait until he comes in on 12/ 10 to discuss.

## 2017-03-07 ENCOUNTER — Ambulatory Visit: Payer: Medicare Other | Admitting: Internal Medicine

## 2017-03-15 ENCOUNTER — Encounter: Payer: Self-pay | Admitting: Adult Health

## 2017-03-15 ENCOUNTER — Ambulatory Visit: Payer: Medicare Other | Admitting: Adult Health

## 2017-03-15 VITALS — BP 118/68 | HR 87 | Ht 71.0 in | Wt 269.0 lb

## 2017-03-15 DIAGNOSIS — G4733 Obstructive sleep apnea (adult) (pediatric): Secondary | ICD-10-CM | POA: Diagnosis not present

## 2017-03-15 NOTE — Patient Instructions (Signed)
Refer for mask fitting .  Change to Auto set 8 to 20cm .  Continue on CPAP At bedtime  .  Download in 1 month .  Follow up with Dr. Annamaria Boots  In 4-6 months and As needed

## 2017-03-15 NOTE — Assessment & Plan Note (Signed)
Control not as good .  Will send for mask fitting  Change to auto set 10 to 20cm   Plan  Patient Instructions  Refer for mask fitting .  Change to Auto set 8 to 20cm .  Continue on CPAP At bedtime  .  Download in 1 month .  Follow up with Dr. Annamaria Boots  In 4-6 months and As needed

## 2017-03-15 NOTE — Progress Notes (Signed)
@Patient  ID: Max [name], male    DOB: 11-May-1945, 71 y.o.   MRN: [id number]  Chief Complaint  Patient presents with  . Follow-up    OSA     Referring provider: Antony Blackbird, MD  HPI: 71 year old male never smoker followed for obstructive sleep apnea  03/15/2017 follow-up sleep apnea Patient returns for follow-up for sleep apnea.  Patient says he wears his CPAP each night.  Patient says recently he feels that he is not getting enough pressure.  His mask is also leaking.  He is gotten 2 new mask he alternates between a full face mask and nasal pillows.  Despite the new mask there is still leaking.  Download shows excellent compliance with average usage at 12 hours.  Patient is on CPAP 12 cm H2O.  AHI 16.3.  Positive leaks. Patient says he has been more tired recently.  He says he had back surgery a few weeks ago and has been recovering.  He is taking pain medication.    Allergies  Allergen Reactions  . Butane Anxiety and Other (See Comments)    Hallucinations REACTIONS ARE SIDE EFFECTS   . Gabapentin Other (See Comments)    Causes agression  . Hydromorphone Swelling    SWELLING REACTION UNSPECIFIED   . Tizanidine Swelling    SWELLING REACTION UNSPECIFIED   . Topamax [Topiramate] Other (See Comments)    Hallucinations   . Buprenorphine Other (See Comments)    UNSPECIFIED REACTION   . Pork-Derived Products     Pt. Refuses pork derived products  . Latex Rash    Localized rash    Immunization History  Administered Date(s) Administered  . Influenza Split 11/21/2014  . Influenza,inj,Quad PF,6+ Mos 10/27/2016    Past Medical History:  Diagnosis Date  . Anxiety   . Chronic pain   . Depression   . Diabetes mellitus without complication ([place])    Type II  . DVT (deep venous thrombosis) ([place]) 2004   leg (not sure which leg)  . Dyslipidemia   . Dyspnea    with exertion  . Hard of hearing   . Hypertension   . Numbness and tingling    bilateral  . OSA  (obstructive sleep apnea)    wears CPAP  . Urinary frequency   . Wears glasses     Tobacco History: Social History   Tobacco Use  Smoking Status Never Smoker  Smokeless Tobacco Never Used   Counseling given: Not Answered   Outpatient Encounter Medications as of 03/15/2017  Medication Sig  . albuterol (PROAIR HFA) 108 (90 Base) MCG/ACT inhaler Inhale 2 puffs into the lungs 4 (four) times daily as needed for shortness of breath.  Marland Kitchen amLODipine (NORVASC) 10 MG tablet Take 10 mg by mouth daily.   . cephALEXin (KEFLEX) 500 MG capsule Take 1 capsule (500 mg total) by mouth 3 (three) times daily.  . DULoxetine (CYMBALTA) 60 MG capsule Take 60 mg by mouth 2 (two) times daily.  . Fluticasone Furoate-Vilanterol (BREO ELLIPTA) 100-25 MCG/INH AEPB Inhale 1 puff then rinse mouth, once daily  . hydrochlorothiazide (HYDRODIURIL) 25 MG tablet Take 25 mg by mouth daily.  . insulin NPH Human (NOVOLIN N) 100 UNIT/ML injection Inject 0.05 mLs (5 Units total) into the skin at bedtime. (Patient taking differently: Inject 20 Units into the skin at bedtime. )  . insulin regular (NOVOLIN R RELION) 250 units/2.81mL (100 units/mL) injection INJECT 15 UNITS SUBCUTANEOUSLY THREE TIMES DAILY BEFORE MEAL(S)  . losartan (COZAAR) 100 MG  tablet Take 100 mg by mouth daily.  . meloxicam (MOBIC) 7.5 MG tablet Take 7.5 mg by mouth 2 (two) times daily as needed for pain.   . naproxen sodium (ANAPROX) 220 MG tablet Take 220 mg by mouth daily as needed (for pain).   . NOVOLIN N RELION 100 UNIT/ML injection INJECT 10 UNITS SUBCUTANEOUSLY AT BEDTIME  . oxyCODONE (OXYCONTIN) 20 MG 12 hr tablet Take 20 mg by mouth every 12 (twelve) hours.  Marland Kitchen oxyCODONE (ROXICODONE) 15 MG immediate release tablet Take 15 mg by mouth 3 (three) times daily.  . pravastatin (PRAVACHOL) 20 MG tablet Take 20 mg by mouth at bedtime.  . tamsulosin (FLOMAX) 0.4 MG CAPS capsule Take 0.4 mg by mouth daily.    No facility-administered encounter medications  on file as of 03/15/2017.      Review of Systems  Constitutional:   No  weight loss, night sweats,  Fevers, chills, fatigue, or  lassitude.  HEENT:   No headaches,  Difficulty swallowing,  Tooth/dental problems, or  Sore throat,                No sneezing, itching, ear ache, nasal congestion, post nasal drip,   CV:  No chest pain,  Orthopnea, PND, swelling in lower extremities, anasarca, dizziness, palpitations, syncope.   GI  No heartburn, indigestion, abdominal pain, nausea, vomiting, diarrhea, change in bowel habits, loss of appetite, bloody stools.   Resp: No shortness of breath with exertion or at rest.  No excess mucus, no productive cough,  No non-productive cough,  No coughing up of blood.  No change in color of mucus.  No wheezing.  No chest wall deformity  Skin: no rash or lesions.  GU: no dysuria, change in color of urine, no urgency or frequency.  No flank pain, no hematuria   MS:  No joint pain or swelling.  No decreased range of motion.      Physical Exam  BP 118/68 (BP Location: Left Arm, Cuff Size: Normal)   Pulse 87   Ht 5\' 11"  (1.803 m)   Wt 269 lb (122 kg)   SpO2 90%   BMI 37.52 kg/m    GEN: A/Ox3; pleasant , NAD, obese  HEENT:  [place]/AT,  EACs-clear, TMs-wnl, NOSE-clear, THROAT-clear, no lesions, no postnasal drip or exudate noted.  Class II-III MP airway  NECK:  Supple w/ fair ROM; no JVD; normal carotid impulses w/o bruits; no thyromegaly or nodules palpated; no lymphadenopathy.    RESP  Clear  P & A; w/o, wheezes/ rales/ or rhonchi. no accessory muscle use, no dullness to percussion  CARD:  RRR, no m/r/g, no peripheral edema, pulses intact, no cyanosis or clubbing.  GI:   Soft & nt; nml bowel sounds; no organomegaly or masses detected.   Musco: Warm bil, no deformities or joint swelling noted.  Back brace   Neuro: alert, no focal deficits noted.    Skin: Warm, no lesions or rashes    Lab Results:   BNP No results found for:  BNP  ProBNP No results found for: PROBNP  Imaging: No results found.   Assessment & Plan:   Obstructive sleep apnea Control not as good .  Will send for mask fitting  Change to auto set 10 to 20cm   Plan  Patient Instructions  Refer for mask fitting .  Change to Auto set 8 to 20cm .  Continue on CPAP At bedtime  .  Download in 1 month .  Follow up  with Dr. Annamaria Boots  In 4-6 months and As needed       Obesity Wt loss .      Rexene Edison, NP 03/15/2017

## 2017-03-15 NOTE — Assessment & Plan Note (Signed)
Wt loss  

## 2017-04-18 ENCOUNTER — Ambulatory Visit (HOSPITAL_BASED_OUTPATIENT_CLINIC_OR_DEPARTMENT_OTHER): Payer: Medicare Other | Attending: Adult Health | Admitting: Radiology

## 2017-04-18 DIAGNOSIS — G4733 Obstructive sleep apnea (adult) (pediatric): Secondary | ICD-10-CM

## 2017-06-27 ENCOUNTER — Encounter: Payer: Self-pay | Admitting: Internal Medicine

## 2017-06-28 ENCOUNTER — Telehealth: Payer: Self-pay | Admitting: Internal Medicine

## 2017-06-28 DIAGNOSIS — G4733 Obstructive sleep apnea (adult) (pediatric): Secondary | ICD-10-CM

## 2017-06-28 NOTE — Telephone Encounter (Signed)
Called and spoke to pt's spouse, Max [name] (DPR) Max [name] states that pt's cpap machine has a message stating that machine has reached life expectancy.  Order was placed on 03/15/18 for auto 8-20cm H2O.  CY please advise if okay to place order for new cpap with current settings.

## 2017-06-28 NOTE — Telephone Encounter (Signed)
Yes- replacement for old CPAP, auto 8-20, mask of choice, humidifier, supplies, AirView

## 2017-06-28 NOTE — Telephone Encounter (Signed)
Called and spoke with patient and his wife. Let them know that Dr. Annamaria Boots is going to order for replacement of old CPAP. Patient stated that he uses [facility] on [address]. Order was placed.  Nothing further is needed at this time.

## 2017-06-28 NOTE — Addendum Note (Signed)
Addended by: Dolores Lory on: 06/28/2017 03:37 PM   Modules accepted: Orders

## 2017-07-13 ENCOUNTER — Telehealth: Payer: Self-pay | Admitting: Internal Medicine

## 2017-07-13 NOTE — Telephone Encounter (Signed)
Attempted to contact Max [name] with [facility]. There was no answer and I could not leave a message for her. Will try back later.

## 2017-07-14 ENCOUNTER — Ambulatory Visit: Payer: Medicare Other | Admitting: Internal Medicine

## 2017-07-14 ENCOUNTER — Encounter: Payer: Self-pay | Admitting: Internal Medicine

## 2017-07-14 VITALS — BP 150/88 | HR 95 | Ht 71.0 in | Wt 273.4 lb

## 2017-07-14 DIAGNOSIS — J4520 Mild intermittent asthma, uncomplicated: Secondary | ICD-10-CM

## 2017-07-14 DIAGNOSIS — G4733 Obstructive sleep apnea (adult) (pediatric): Secondary | ICD-10-CM | POA: Diagnosis not present

## 2017-07-14 MED ORDER — FLUTICASONE FUROATE-VILANTEROL 100-25 MCG/INH IN AEPB: INHALATION_SPRAY | RESPIRATORY_TRACT | 12 refills | Status: DC

## 2017-07-14 MED ORDER — ALBUTEROL SULFATE HFA 108 (90 BASE) MCG/ACT IN AERS: 2.0000 | INHALATION_SPRAY | Freq: Four times a day (QID) | RESPIRATORY_TRACT | 12 refills | Status: DC | PRN

## 2017-07-14 NOTE — Assessment & Plan Note (Signed)
Wife and download confirm good compliance and control. He sleeps better with CPAP. Plan- continue CPAP auto 8-20

## 2017-07-14 NOTE — Assessment & Plan Note (Signed)
Asks refills Breo and albuterol Only occ need for rescue with no sleep disturbance by wheezing.

## 2017-07-14 NOTE — Telephone Encounter (Signed)
Spoke with pt's wife, she states they have received CPAP and they are currently at [facility] getting the machine set up. Nothing further is needed.

## 2017-07-14 NOTE — Patient Instructions (Addendum)
We can continue CPAP auto 8-20, mask of choice, humidifier, supplies, AirView     Refill scripts sent for Breo and albuterol  Please call if we can help

## 2017-07-14 NOTE — Progress Notes (Signed)
HPI  male never smoker followed for OSA, complicated by DM  -------------------  03/05/2016-72 year old male never smoker followed for OSA, asthma, complicated by DM, hx DVT, HBP CPAP 12/ [facility], download done. Sleep-disordered by frequent nocturia-has urologist. Also chronic back pain. Pending ENT surgery for retained salivary stone under right mandible. Definitely sleeps better with CPAP. Nasal pillows mask. Wife notices he snores some occasionally if mask is displaced. Occasional dyspnea on exertion with wheeze. He expects to wheeze a little [place] [facility] [facility] [place] [facility] [place] [facility]. Infrequent use of rescue inhaler. Download shows 100% compliance with average use 12 hours/day (?), AHI 6.1/hour.  07/14/2017- 72 year old male never smoker followed for OSA, asthma, complicated by DM, hx DVT, HBP CPAP auto 8-20/ [facility] ----Pt states CPAP was switched out and is very pleased with the new machine. DME: [facility] Increased nasal congestion x 2 weeks "pollen", but not interfering with CPAP use.  Download compliance 100 %, AHI 5.3/ hr. Wife confirms not snoring.  Asthma good- only occasional need for rescue inhaler.  ROS-see HPI   + = positive Constitutional:   No-   weight loss, night sweats, fevers, chills, fatigue, lassitude. HEENT:   No-  headaches, difficulty swallowing, tooth/dental problems, sore throat,       No-  sneezing, itching, ear ache, +nasal congestion, post nasal drip,  CV:  No-   chest pain, orthopnea, PND, swelling in lower extremities, anasarca, dizziness, palpitations Resp: No-   shortness of breath with exertion or at rest.              No-   productive cough,  No non-productive cough,  No- coughing up of blood.              No-   change in color of mucus.  + wheezing.   Skin: No-   rash or lesions. GI:  No-   heartburn, indigestion, abdominal pain, nausea, vomiting,  GU: . MS:  No-   joint pain or swelling.  + back pain. Neuro-     nothing  unusual Psych:  No- change in mood or affect. No depression or anxiety.  No memory loss.  OBJ- Physical Exam General- Alert, Oriented, Affect-appropriate, Distress-mild due to back pain . + Overweight Skin- rash-none, lesions- none, excoriation- none Lymphadenopathy- none Head- atraumatic.             Eyes- Gross vision intact, PERRLA, conjunctivae and secretions clear            Ears- +Hearing aid/ HOH            Nose- Clear, no-Septal dev, mucus, polyps, erosion, perforation             Throat- Mallampati III , mucosa clear , drainage- none, tonsils- atrophic. +Dentures Neck- flexible , trachea midline, no stridor , thyroid nl, carotid no bruit Chest - symmetrical excursion , unlabored           Heart/CV- RRR , no murmur , no gallop  , no rub, nl s1 s2                           - JVD- none , edema- none, stasis changes- none, varices- none           Lung- clear,  wheeze -none, cough- none , dullness-none, rub- none           Chest wall- + wearing a back brace Abd-  Br/ Gen/ Rectal- Not done, not indicated Extrem- cyanosis- none, clubbing, none, atrophy- none, strength- nl Neuro-+ seems restless, shifting side to side

## 2017-09-15 ENCOUNTER — Encounter (INDEPENDENT_AMBULATORY_CARE_PROVIDER_SITE_OTHER): Payer: Self-pay

## 2017-09-28 ENCOUNTER — Encounter (INDEPENDENT_AMBULATORY_CARE_PROVIDER_SITE_OTHER): Payer: Self-pay | Admitting: Family Medicine

## 2017-09-28 ENCOUNTER — Ambulatory Visit (INDEPENDENT_AMBULATORY_CARE_PROVIDER_SITE_OTHER): Payer: Medicare Other | Admitting: Family Medicine

## 2017-09-28 VITALS — BP 145/79 | HR 77 | Temp 97.7°F | Ht 70.0 in | Wt 273.0 lb

## 2017-09-28 DIAGNOSIS — R5383 Other fatigue: Secondary | ICD-10-CM

## 2017-09-28 DIAGNOSIS — Z0289 Encounter for other administrative examinations: Secondary | ICD-10-CM

## 2017-09-28 DIAGNOSIS — E559 Vitamin D deficiency, unspecified: Secondary | ICD-10-CM

## 2017-09-28 DIAGNOSIS — R0602 Shortness of breath: Secondary | ICD-10-CM | POA: Diagnosis not present

## 2017-09-28 DIAGNOSIS — I10 Essential (primary) hypertension: Secondary | ICD-10-CM | POA: Diagnosis not present

## 2017-09-28 DIAGNOSIS — Z794 Long term (current) use of insulin: Secondary | ICD-10-CM

## 2017-09-28 DIAGNOSIS — E119 Type 2 diabetes mellitus without complications: Secondary | ICD-10-CM | POA: Diagnosis not present

## 2017-09-28 DIAGNOSIS — Z1331 Encounter for screening for depression: Secondary | ICD-10-CM

## 2017-09-28 DIAGNOSIS — Z6838 Body mass index (BMI) 38.0-38.9, adult: Secondary | ICD-10-CM

## 2017-09-29 LAB — LIPID PANEL WITH LDL/HDL RATIO
Cholesterol, Total: 206 mg/dL — ABNORMAL HIGH (ref 100–199)
HDL: 70 mg/dL (ref >39)
LDL Calculated: 124 mg/dL — ABNORMAL HIGH (ref 0–99)
LDl/HDL Ratio: 1.8 ratio (ref 0.0–3.6)
Triglycerides: 60 mg/dL (ref 0–149)
VLDL Cholesterol Cal: 12 mg/dL (ref 5–40)

## 2017-09-29 LAB — HEMOGLOBIN A1C
Est. average glucose Bld gHb Est-mCnc: 192 mg/dL
Hgb A1c MFr Bld: 8.3 % — ABNORMAL HIGH (ref 4.8–5.6)

## 2017-09-29 LAB — T3: T3, Total: 107 ng/dL (ref 71–180)

## 2017-09-29 LAB — COMPREHENSIVE METABOLIC PANEL
ALT: 27 IU/L (ref 0–44)
AST: 22 IU/L (ref 0–40)
Albumin/Globulin Ratio: 1.9 (ref 1.2–2.2)
Albumin: 4.4 g/dL (ref 3.6–4.8)
Alkaline Phosphatase: 106 IU/L (ref 39–117)
BUN/Creatinine Ratio: 16 (ref 10–24)
BUN: 15 mg/dL (ref 8–27)
Bilirubin Total: 0.4 mg/dL (ref 0.0–1.2)
CO2: 24 mmol/L (ref 20–29)
Calcium: 9.4 mg/dL (ref 8.6–10.2)
Chloride: 100 mmol/L (ref 96–106)
Creatinine, Ser: 0.91 mg/dL (ref 0.76–1.27)
GFR calc Af Amer: 99 mL/min/{1.73_m2} (ref >59)
GFR calc non Af Amer: 86 mL/min/{1.73_m2} (ref >59)
Globulin, Total: 2.3 g/dL (ref 1.5–4.5)
Glucose: 127 mg/dL — ABNORMAL HIGH (ref 65–99)
Potassium: 4 mmol/L (ref 3.5–5.2)
Sodium: 139 mmol/L (ref 134–144)
Total Protein: 6.7 g/dL (ref 6.0–8.5)

## 2017-09-29 LAB — CBC WITH DIFFERENTIAL
Basophils Absolute: 0 10*3/uL (ref 0.0–0.2)
Basos: 0 %
EOS (ABSOLUTE): 0.1 10*3/uL (ref 0.0–0.4)
Eos: 1 %
Hematocrit: 42.3 % (ref 37.5–51.0)
Hemoglobin: 13.8 g/dL (ref 13.0–17.7)
Immature Grans (Abs): 0 10*3/uL (ref 0.0–0.1)
Immature Granulocytes: 0 %
Lymphocytes Absolute: 1.7 10*3/uL (ref 0.7–3.1)
Lymphs: 16 %
MCH: 25.9 pg — ABNORMAL LOW (ref 26.6–33.0)
MCHC: 32.6 g/dL (ref 31.5–35.7)
MCV: 80 fL (ref 79–97)
Monocytes Absolute: 0.7 10*3/uL (ref 0.1–0.9)
Monocytes: 7 %
Neutrophils Absolute: 8.2 10*3/uL — ABNORMAL HIGH (ref 1.4–7.0)
Neutrophils: 76 %
RBC: 5.32 x10E6/uL (ref 4.14–5.80)
RDW: 14.6 % (ref 12.3–15.4)
WBC: 10.8 10*3/uL (ref 3.4–10.8)

## 2017-09-29 LAB — C-PEPTIDE: C-Peptide: 0.8 ng/mL — ABNORMAL LOW (ref 1.1–4.4)

## 2017-09-29 LAB — VITAMIN D 25 HYDROXY (VIT D DEFICIENCY, FRACTURES): Vit D, 25-Hydroxy: 50.1 ng/mL (ref 30.0–100.0)

## 2017-09-29 LAB — T4, FREE: Free T4: 1.36 ng/dL (ref 0.82–1.77)

## 2017-09-29 LAB — TSH: TSH: 0.471 u[IU]/mL (ref 0.450–4.500)

## 2017-09-30 DIAGNOSIS — H6123 Impacted cerumen, bilateral: Secondary | ICD-10-CM | POA: Diagnosis not present

## 2017-09-30 DIAGNOSIS — G4733 Obstructive sleep apnea (adult) (pediatric): Secondary | ICD-10-CM | POA: Diagnosis not present

## 2017-09-30 DIAGNOSIS — T162XXA Foreign body in left ear, initial encounter: Secondary | ICD-10-CM | POA: Diagnosis not present

## 2017-10-03 NOTE — Progress Notes (Signed)
.  Office: [phone]  /  Fax: [phone]   HPI:   Chief Complaint: OBESITY  Max [name] (MR# [id number]) is a 72 y.o. male who presents on 10/03/2017 for obesity evaluation and treatment. Current BMI is Body mass index is 39.17 kg/m.Marland Kitchen Raiyan has struggled with obesity for years and has been unsuccessful in either losing weight or maintaining long term weight loss. Bell heard about our clinic from his sisters. Rachid attended our information session and states he is currently in the action stage of change and ready to dedicate time achieving and maintaining a healthier weight.  Zoey states his family eats meals together he thinks his family will eat healthier with  him his desired weight loss is 94 lbs he started gaining weight in the 1990's his heaviest weight ever was 280 lbs. he has significant food cravings issues  he snacks frequently in the evenings he wakes up frequently in the middle of the night to eat he is frequently drinking liquids with calories he struggles with emotional eating    Fatigue Conrad feels his energy is lower than it should be. This has worsened with weight gain and has not worsened recently. Dimitrios admits to daytime somnolence and admits to waking up still tired. Patient has a diagnosis of obstructive sleep apnea which may contribute to his fatigue. Patent has a history of symptoms of daytime fatigue, morning fatigue and hypertension. Patient generally gets 7 hours of sleep per night, and states they generally have restless sleep. Snoring is present. Apneic episodes are present. Epworth Sleepiness Score is 7  EKG was ordered today and shows normal sinus rhythm.  Dyspnea on exertion [name] notes increasing shortness of breath with exercising and seems to be worsening over time with weight gain. He notes getting out of breath sooner with activity than he used to. This has not gotten worse recently.  EKG was ordered today and shows normal sinus  rhythm. Kjell denies orthopnea.  Diabetes II Melesio has a diagnosis of diabetes type II and is on 70/30. Ifeanyichukwu is taking an unclear amount. Olman needs to call his PCP. Eschol states he is checking blood sugar 4 times daily and BGs are running between 180 and 200's. Leib denies any hypoglycemic episodes. Last A1c was at 7.5 He is attempting to work on intensive lifestyle modifications including diet, exercise, and weight loss to help control his blood glucose levels.  Vitamin D deficiency Tevon has a diagnosis of vitamin D deficiency. He is currently taking OTC vit D supplement and denies nausea, vomiting or muscle weakness.  Hypertension DIMARCO MINKIN is a 72 y.o. male with hypertension. [name] denies chest pain, chest pressure or headache. He is attempting to work on weight loss to help control his blood pressure with the goal of decreasing his risk of heart attack and stroke. Raymonds blood pressure is currently controlled.  Depression Screen Tavari's Food and Mood (modified PHQ-9) score was  Depression screen PHQ 2/9 09/28/2017  Decreased Interest 3  Down, Depressed, Hopeless 3  PHQ - 2 Score 6  Altered sleeping 3  Tired, decreased energy 3  Change in appetite 1  Feeling bad or failure about yourself  2  Trouble concentrating 2  Moving slowly or fidgety/restless 1  Suicidal thoughts 0  PHQ-9 Score 18  Difficult doing work/chores Extremely dIfficult    ALLERGIES: Allergies  Allergen Reactions  . Butane Anxiety and Other (See Comments)    Hallucinations REACTIONS ARE SIDE EFFECTS   . Gabapentin  Other (See Comments)    Causes agression  . Hydromorphone Swelling    SWELLING REACTION UNSPECIFIED   . Tizanidine Swelling    SWELLING REACTION UNSPECIFIED   . Topamax [Topiramate] Other (See Comments)    Hallucinations   . Buprenorphine Other (See Comments)    UNSPECIFIED REACTION   . Pork-Derived Products     Pt. Refuses pork derived products  .  Latex Rash    Localized rash    MEDICATIONS: Current Outpatient Medications on File Prior to Visit  Medication Sig Dispense Refill  . alfuzosin (UROXATRAL) 10 MG 24 hr tablet Take 10 mg by mouth daily with breakfast.    . amLODipine (NORVASC) 10 MG tablet Take 10 mg by mouth daily.     . Cholecalciferol (D3-1000 PO) Take 1 capsule by mouth daily.    . DULoxetine (CYMBALTA) 60 MG capsule Take 60 mg by mouth 2 (two) times daily.    . ferrous sulfate 325 (65 FE) MG EC tablet Take 325 mg by mouth daily with breakfast.    . Flaxseed Oil OIL 750 mg daily.    . hydrochlorothiazide (HYDRODIURIL) 25 MG tablet Take 25 mg by mouth daily.    . hydrOXYzine (VISTARIL) 25 MG capsule   1  . Ibuprofen (ADVIL) 200 MG CAPS Take 2 capsules by mouth daily.    . insulin NPH Human (NOVOLIN N) 100 UNIT/ML injection Inject 0.05 mLs (5 Units total) into the skin at bedtime. (Patient taking differently: Inject 20 Units into the skin at bedtime. ) 10 mL 11  . insulin regular (NOVOLIN R RELION) 250 units/2.64mL (100 units/mL) injection INJECT 15 UNITS SUBCUTANEOUSLY THREE TIMES DAILY BEFORE MEAL(S) 20 mL 0  . losartan (COZAAR) 100 MG tablet Take 100 mg by mouth daily.    . Magnesium 400 MG TABS Take 1 tablet by mouth daily.    . naproxen sodium (ANAPROX) 220 MG tablet Take 220 mg by mouth daily as needed (for pain).     Marland Kitchen oxyCODONE (ROXICODONE) 15 MG immediate release tablet Take 10 mg by mouth 3 (three) times daily.     . Potassium 99 MG TABS Take 1 tablet by mouth daily.    . pravastatin (PRAVACHOL) 20 MG tablet Take 20 mg by mouth at bedtime.    . Probiotic Product (PROBIOTIC-10 PO) Take 1 capsule by mouth daily.    . tamsulosin (FLOMAX) 0.4 MG CAPS capsule Take 0.4 mg by mouth daily.     Marland Kitchen topiramate (TOPAMAX) 25 MG tablet Take 25 mg by mouth 2 (two) times daily.    . [name] 1.5 OT/1.5BW SOPN     . vitamin B-12 (CYANOCOBALAMIN) 1000 MCG tablet Take 1,000 mcg by mouth daily.     No current  facility-administered medications on file prior to visit.     PAST MEDICAL HISTORY: Past Medical History:  Diagnosis Date  . ADD (attention deficit disorder)   . Anxiety   . Back pain   . Bipolar 1 disorder ([place])   . Chronic pain   . Constipation   . Decreased hearing   . Depression   . Diabetes mellitus without complication ([place])    Type II  . Dry skin   . DVT (deep venous thrombosis) ([place]) 2004   leg (not sure which leg)  . Dyslipidemia   . Dyspnea    with exertion  . Ear drainage   . Excessive hunger   . Excessive thirst   . Eye pain   . Fatigue   .  Frequent urination   . Hard of hearing   . Hyperlipidemia   . Hypertension   . Itching   . Joint pain   . Lactose intolerance   . Leg cramps   . Mouth sores   . Nasal and sinus discharge   . Nausea   . Numbness and tingling    bilateral  . OSA (obstructive sleep apnea)    wears CPAP  . Shortness of breath   . Shortness of breath on exertion   . Sinus congestion   . Stiff neck   . Stress   . Swallowing difficulty   . Swelling of extremity   . Tinnitus   . Trouble in sleeping   . Urinary frequency   . Vision changes   . Weakness   . Wears glasses     PAST SURGICAL HISTORY: Past Surgical History:  Procedure Laterality Date  . arm surgery Right   . BACK SURGERY  2004  . COLONOSCOPY    . ESOPHAGOGASTRODUODENOSCOPY    . EYE SURGERY Right    cataract  . SUBMANDIBULAR GLAND EXCISION Right 03/10/2016   Procedure: INTRAORAL  SUBMANDIBULAR  STONE REMOVAL;  Surgeon: Izora Gala, MD;  Location: [facility];  Service: ENT;  Laterality: Right;    SOCIAL HISTORY: Social History   Tobacco Use  . Smoking status: Never Smoker  . Smokeless tobacco: Never Used  Substance Use Topics  . Alcohol use: No  . Drug use: No    FAMILY HISTORY: Family History  Problem Relation Age of Onset  . Diabetes Mother   . Hypertension Mother   . Hyperlipidemia Mother   . Kidney disease Mother   . Cancer Mother   . Diabetes  Brother     ROS: Review of Systems  Constitutional: Positive for malaise/fatigue.  HENT: Positive for congestion (nasal stuffiness), ear discharge, hearing loss and tinnitus.        Positive for Nasal Discharge Dentures Mouth Sores  Eyes: Positive for pain.       Vision Changes Wear Glasses or Contacts Floaters  Respiratory: Positive for shortness of breath.   Cardiovascular: Negative for chest pain and orthopnea.       Positive for Shortness of Breath with Activity Leg Cramping Negative for chest pressure  Gastrointestinal: Positive for constipation and nausea. Negative for vomiting.       Positive for Swallowing Difficulty  Genitourinary: Positive for frequency.  Musculoskeletal: Positive for back pain.       Neck Lumps Neck Stiffness Muscle or Joint Pain Muscle Stiffness Negative for muscle weakness  Skin: Positive for itching.       Dryness   Neurological: Positive for weakness. Negative for headaches.  Endo/Heme/Allergies: Positive for polydipsia.       Positive for Excessive Hunger Negative for hypoglycemia Positive for hyperglycemia  Psychiatric/Behavioral: Positive for depression. The patient has insomnia.        Stress    PHYSICAL EXAM: Blood pressure (!) 145/79, pulse 77, temperature 97.7 F (36.5 C), temperature source Oral, height 5\' 10"  (1.778 m), weight 273 lb (123.8 kg), SpO2 96 %. Body mass index is 39.17 kg/m. Physical Exam  Constitutional: He is oriented to person, place, and time. He appears well-developed and well-nourished.  HENT:  Head: Normocephalic and atraumatic.  Nose: Nose normal.  Eyes: EOM are normal. No scleral icterus.  Neck: Normal range of motion. Neck supple. No thyromegaly present.  Cardiovascular: Normal rate and regular rhythm.  Pulmonary/Chest: Effort normal. No respiratory distress.  Abdominal:  Soft. There is no tenderness.  Musculoskeletal: Normal range of motion.  Range of Motion normal in all 4 extremities    Neurological: He is alert and oriented to person, place, and time. Coordination normal.  Skin: Skin is warm and dry.  Psychiatric: He has a normal mood and affect. His behavior is normal.  Vitals reviewed.   RECENT LABS AND TESTS: BMET    Component Value Date/Time   NA 139 09/28/2017 1145   K 4.0 09/28/2017 1145   CL 100 09/28/2017 1145   CO2 24 09/28/2017 1145   GLUCOSE 127 (H) 09/28/2017 1145   GLUCOSE 208 (H) 03/09/2016 1424   BUN 15 09/28/2017 1145   CREATININE 0.91 09/28/2017 1145   CALCIUM 9.4 09/28/2017 1145   GFRNONAA 86 09/28/2017 1145   GFRAA 99 09/28/2017 1145   Lab Results  Component Value Date   HGBA1C 8.3 (H) 09/28/2017   No results found for: INSULIN CBC    Component Value Date/Time   WBC 10.8 09/28/2017 1145   WBC 11.6 (H) 03/09/2016 1424   RBC 5.32 09/28/2017 1145   RBC 5.17 03/09/2016 1424   HGB 13.8 09/28/2017 1145   HCT 42.3 09/28/2017 1145   PLT 291 03/09/2016 1424   MCV 80 09/28/2017 1145   MCH 25.9 (L) 09/28/2017 1145   MCH 26.7 03/09/2016 1424   MCHC 32.6 09/28/2017 1145   MCHC 33.3 03/09/2016 1424   RDW 14.6 09/28/2017 1145   LYMPHSABS 1.7 09/28/2017 1145   EOSABS 0.1 09/28/2017 1145   BASOSABS 0.0 09/28/2017 1145   Iron/TIBC/Ferritin/ %Sat No results found for: IRON, TIBC, FERRITIN, IRONPCTSAT Lipid Panel     Component Value Date/Time   CHOL 206 (H) 09/28/2017 1145   TRIG 60 09/28/2017 1145   HDL 70 09/28/2017 1145   CHOLHDL 3.8 Ratio 01/09/2008 2228   VLDL 29 01/09/2008 2228   LDLCALC 124 (H) 09/28/2017 1145   Hepatic Function Panel     Component Value Date/Time   PROT 6.7 09/28/2017 1145   ALBUMIN 4.4 09/28/2017 1145   AST 22 09/28/2017 1145   ALT 27 09/28/2017 1145   ALKPHOS 106 09/28/2017 1145   BILITOT 0.4 09/28/2017 1145      Component Value Date/Time   TSH 0.471 09/28/2017 1145   Vitamin D There are no recent lab results  ECG  shows NSR with a rate of 76 BPM INDIRECT CALORIMETER done today shows a VO2 of  280 and a REE of 1952. His calculated basal metabolic rate is 4010 thus his basal metabolic rate is worse than expected.    ASSESSMENT AND PLAN: Other fatigue - Plan: EKG 12-Lead, CBC With Differential, T3, T4, free, TSH  Shortness of breath on exertion  Type 2 diabetes mellitus without complication, with long-term current use of insulin (HCC) - Plan: Comprehensive metabolic panel, Hemoglobin A1c, C-peptide  Vitamin D deficiency - Plan: VITAMIN D 25 Hydroxy (Vit-D Deficiency, Fractures)  Essential hypertension - Plan: Lipid Panel With LDL/HDL Ratio  Depression screening  Class 2 severe obesity with serious comorbidity and body mass index (BMI) of 38.0 to 38.9 in adult, unspecified obesity type ([place])  PLAN:  Fatigue Adyen was informed that his fatigue may be related to obesity, depression or many other causes. Labs will be ordered, and in the meanwhile Quitman has agreed to work on diet, exercise and weight loss to help with fatigue. Proper sleep hygiene was discussed including the need for 7-8 hours of quality sleep each night. A sleep study was not  ordered based on symptoms and Epworth score. We will order indirect calorimetry today.  Dyspnea on exertion Ender's shortness of breath appears to be obesity related and exercise induced. He has agreed to work on weight loss and gradually increase exercise to treat his exercise induced shortness of breath. If Dov follows our instructions and loses weight without improvement of his shortness of breath, we will plan to refer to pulmonology. We will order labs and indirect calorimetry today and will monitor this condition regularly. Jahvon agrees to this plan.  Diabetes II Van has been given extensive diabetes education by myself today including ideal fasting and post-prandial blood glucose readings, individual ideal Hgb A1c goals and hypoglycemia prevention. Hypoglycemia protocol was discussed today. We discussed the importance of  good blood sugar control to decrease the likelihood of diabetic complications such as nephropathy, neuropathy, limb loss, blindness, coronary artery disease, and death. We discussed the importance of intensive lifestyle modification including diet, exercise and weight loss as the first line treatment for diabetes. We will check C-peptide and Hgb A1c today and Jelan will follow up at the agreed upon time.  Vitamin D Deficiency Bawi was informed that low vitamin D levels contributes to fatigue and are associated with obesity, breast, and colon cancer. He will continue OTC vit D and we will check vitamin D level today. Johnston will follow up for routine testing of vitamin D, at least 2-3 times per year. He was informed of the risk of over-replacement of vitamin D and agrees to not increase his dose unless he discusses this with [name] first.  Hypertension We discussed sodium restriction, working on healthy weight loss, and a regular exercise program as the means to achieve improved blood pressure control. Blaike agreed with this plan and agreed to follow up as directed. We will check CMP, EKG and lipid panel today and will continue to monitor his blood pressure as well as his progress with the above lifestyle modifications. He will watch for signs of hypotension as he continues his lifestyle modifications.  Depression Screen Cinque had a strongly positive depression screening. Depression is commonly associated with obesity and often results in emotional eating behaviors. We will monitor this closely and work on CBT to help improve the non-hunger eating patterns. Referral to Psychology may be required if no improvement is seen as he continues in our clinic.  Obesity Kj is currently in the action stage of change and his goal is to continue with weight loss efforts He has agreed to follow the Category 3 plan Nikolai has been instructed to work up to a goal of 150 minutes of combined cardio and  strengthening exercise per week for weight loss and overall health benefits. We discussed the following Behavioral Modification Strategies today: planning for success, increasing lean protein intake, increasing vegetables and work on meal planning and easy cooking plans  Darrian has agreed to follow up with our clinic in 2 weeks. He was informed of the importance of frequent follow up visits to maximize his success with intensive lifestyle modifications for his multiple health conditions. He was informed we would discuss his lab results at his next visit unless there is a critical issue that needs to be addressed sooner. Kenly agreed to keep his next visit at the agreed upon time to discuss these results.    OBESITY BEHAVIORAL INTERVENTION VISIT  Today's visit was # 1 out of 22.  Starting weight: 273 lbs Starting date: 09/28/17 Today's weight : 273 lbs  Today's date: 09/28/2017  Total lbs lost to date: 0 (Patients must lose 7 lbs in the first 6 months to continue with counseling)   ASK: We discussed the diagnosis of obesity with [name] today and Gavan agreed to give [name] permission to discuss obesity behavioral modification therapy today.  ASSESS: Watt has the diagnosis of obesity and his BMI today is 39.17 Zayan is in the action stage of change   ADVISE: Jonmarc was educated on the multiple health risks of obesity as well as the benefit of weight loss to improve his health. He was advised of the need for long term treatment and the importance of lifestyle modifications.  AGREE: Multiple dietary modification options and treatment options were discussed and  Maveric agreed to the above obesity treatment plan.   I, Doreene Nest, am acting as transcriptionist for Eber Jones, MD   I have reviewed the above documentation for accuracy and completeness, and I agree with the above. - Ilene Qua, MD

## 2017-10-12 ENCOUNTER — Ambulatory Visit (INDEPENDENT_AMBULATORY_CARE_PROVIDER_SITE_OTHER): Payer: Medicare Other | Admitting: Family Medicine

## 2017-10-12 VITALS — BP 138/75 | HR 88 | Temp 97.8°F | Ht 70.0 in | Wt 277.0 lb

## 2017-10-12 DIAGNOSIS — E7849 Other hyperlipidemia: Secondary | ICD-10-CM

## 2017-10-12 DIAGNOSIS — Z6839 Body mass index (BMI) 39.0-39.9, adult: Secondary | ICD-10-CM | POA: Diagnosis not present

## 2017-10-12 DIAGNOSIS — E119 Type 2 diabetes mellitus without complications: Secondary | ICD-10-CM | POA: Diagnosis not present

## 2017-10-12 DIAGNOSIS — Z794 Long term (current) use of insulin: Secondary | ICD-10-CM | POA: Diagnosis not present

## 2017-10-12 NOTE — Progress Notes (Signed)
Office: [phone]  /  Fax: [phone]   HPI:   Chief Complaint: OBESITY Max [name] is here to discuss his progress with his obesity treatment plan. He is on the Category 3 plan and is following his eating plan approximately 70 % of the time. He states he is exercising 0 minutes 0 times per week. Quantarius started out really well, but then had a death in the family and had to travel to the funeral and derailed off the meal plan. He did not have hunger when he was following the plan.  His weight is 277 lb (125.6 kg) today and has not lost weight since his last visit. He has gained 4 lbs since starting treatment with [name].  Diabetes II Devlon has a diagnosis of diabetes type II. Duncan was on 70/30 insulin 60U at night before bed and 60U in the morning. He saw almost immediate results when following the plan and stopped taking 70/30 in the morning. His glucometer broke this morning. He denies any hypoglycemic episodes.   He has been working on intensive lifestyle modifications including diet, exercise, and weight loss to help control his blood glucose levels.  Hyperlipidemia Kacey has hyperlipidemia and has been trying to improve his cholesterol levels with intensive lifestyle modification including a low saturated fat diet, exercise and weight loss. He denies any chest pain, claudication or myalgias. He is on Pravastatin 50m.  ALLERGIES: Allergies  Allergen Reactions  . Butane Anxiety and Other (See Comments)    Hallucinations REACTIONS ARE SIDE EFFECTS   . Gabapentin Other (See Comments)    Causes agression  . Hydromorphone Swelling    SWELLING REACTION UNSPECIFIED   . Tizanidine Swelling    SWELLING REACTION UNSPECIFIED   . Topamax [Topiramate] Other (See Comments)    Hallucinations   . Buprenorphine Other (See Comments)    UNSPECIFIED REACTION   . Pork-Derived Products     Pt. Refuses pork derived products  . Latex Rash    Localized rash    MEDICATIONS: Current  Outpatient Medications on File Prior to Visit  Medication Sig Dispense Refill  . alfuzosin (UROXATRAL) 10 MG 24 hr tablet Take 10 mg by mouth daily with breakfast.    . amLODipine (NORVASC) 10 MG tablet Take 10 mg by mouth daily.     . Cholecalciferol (D3-1000 PO) Take 1 capsule by mouth daily.    . DULoxetine (CYMBALTA) 60 MG capsule Take 60 mg by mouth 2 (two) times daily.    . ferrous sulfate 325 (65 FE) MG EC tablet Take 325 mg by mouth daily with breakfast.    . Flaxseed Oil OIL 750 mg daily.    . hydrochlorothiazide (HYDRODIURIL) 25 MG tablet Take 25 mg by mouth daily.    . hydrOXYzine (VISTARIL) 25 MG capsule   1  . Ibuprofen (ADVIL) 200 MG CAPS Take 2 capsules by mouth daily.    . insulin NPH Human (NOVOLIN N) 100 UNIT/ML injection Inject 0.05 mLs (5 Units total) into the skin at bedtime. 10 mL 11  . insulin regular (NOVOLIN R RELION) 250 units/2.535m(100 units/mL) injection INJECT 15 UNITS SUBCUTANEOUSLY THREE TIMES DAILY BEFORE MEAL(S) 20 mL 0  . losartan (COZAAR) 100 MG tablet Take 100 mg by mouth daily.    . Magnesium 400 MG TABS Take 1 tablet by mouth daily.    . naproxen sodium (ANAPROX) 220 MG tablet Take 220 mg by mouth daily as needed (for pain).     . Marland KitchenxyCODONE (ROXICODONE) 15 MG immediate  release tablet Take 10 mg by mouth 3 (three) times daily.     . Potassium 99 MG TABS Take 1 tablet by mouth daily.    . pravastatin (PRAVACHOL) 20 MG tablet Take 40 mg by mouth at bedtime.     . Probiotic Product (PROBIOTIC-10 PO) Take 1 capsule by mouth daily.    . tamsulosin (FLOMAX) 0.4 MG CAPS capsule Take 0.4 mg by mouth daily.     Marland Kitchen topiramate (TOPAMAX) 25 MG tablet Take 25 mg by mouth 2 (two) times daily.    . [name] 1.5 DG/3.8VF SOPN     . vitamin B-12 (CYANOCOBALAMIN) 1000 MCG tablet Take 1,000 mcg by mouth daily.     No current facility-administered medications on file prior to visit.     PAST MEDICAL HISTORY: Past Medical History:  Diagnosis Date  . ADD (attention  deficit disorder)   . Anxiety   . Back pain   . Bipolar 1 disorder ([place])   . Chronic pain   . Constipation   . Decreased hearing   . Depression   . Diabetes mellitus without complication ([place])    Type II  . Dry skin   . DVT (deep venous thrombosis) ([place]) 2004   leg (not sure which leg)  . Dyslipidemia   . Dyspnea    with exertion  . Ear drainage   . Excessive hunger   . Excessive thirst   . Eye pain   . Fatigue   . Frequent urination   . Hard of hearing   . Hyperlipidemia   . Hypertension   . Itching   . Joint pain   . Lactose intolerance   . Leg cramps   . Mouth sores   . Nasal and sinus discharge   . Nausea   . Numbness and tingling    bilateral  . OSA (obstructive sleep apnea)    wears CPAP  . Shortness of breath   . Shortness of breath on exertion   . Sinus congestion   . Stiff neck   . Stress   . Swallowing difficulty   . Swelling of extremity   . Tinnitus   . Trouble in sleeping   . Urinary frequency   . Vision changes   . Weakness   . Wears glasses     PAST SURGICAL HISTORY: Past Surgical History:  Procedure Laterality Date  . arm surgery Right   . BACK SURGERY  2004  . COLONOSCOPY    . ESOPHAGOGASTRODUODENOSCOPY    . EYE SURGERY Right    cataract  . SUBMANDIBULAR GLAND EXCISION Right 03/10/2016   Procedure: INTRAORAL  SUBMANDIBULAR  STONE REMOVAL;  Surgeon: Izora Gala, MD;  Location: [facility];  Service: ENT;  Laterality: Right;    SOCIAL HISTORY: Social History   Tobacco Use  . Smoking status: Never Smoker  . Smokeless tobacco: Never Used  Substance Use Topics  . Alcohol use: No  . Drug use: No    FAMILY HISTORY: Family History  Problem Relation Age of Onset  . Diabetes Mother   . Hypertension Mother   . Hyperlipidemia Mother   . Kidney disease Mother   . Cancer Mother   . Diabetes Brother     ROS: Review of Systems  Constitutional: Negative for weight loss.  Cardiovascular: Negative for chest pain and claudication.    Musculoskeletal: Negative for myalgias.  Endo/Heme/Allergies:       Negative for hypoglycemia    PHYSICAL EXAM: Blood pressure 138/75, pulse 88,  temperature 97.8 F (36.6 C), temperature source Oral, height _0  (1.778 m), weight 277 lb (125.6 kg), SpO2 97 %. Body mass index is 39.75 kg/m. Physical Exam  Constitutional: He is oriented to person, place, and time. He appears well-developed and well-nourished.  Cardiovascular: Normal rate.  Pulmonary/Chest: Effort normal.  Musculoskeletal: Normal range of motion.  Neurological: He is oriented to person, place, and time.  Skin: Skin is warm and dry.  Vitals reviewed.   RECENT LABS AND TESTS: BMET    Component Value Date/Time   NA 139 09/28/2017 1145   K 4.0 09/28/2017 1145   CL 100 09/28/2017 1145   CO2 24 09/28/2017 1145   GLUCOSE 127 (H) 09/28/2017 1145   GLUCOSE 208 (H) 03/09/2016 1424   BUN 15 09/28/2017 1145   CREATININE 0.91 09/28/2017 1145   CALCIUM 9.4 09/28/2017 1145   GFRNONAA 86 09/28/2017 1145   GFRAA 99 09/28/2017 1145   Lab Results  Component Value Date   HGBA1C 8.3 (H) 09/28/2017   HGBA1C 7.2 (H) 03/09/2016   HGBA1C 7.9 08/12/2015   No results found for: INSULIN CBC    Component Value Date/Time   WBC 10.8 09/28/2017 1145   WBC 11.6 (H) 03/09/2016 1424   RBC 5.32 09/28/2017 1145   RBC 5.17 03/09/2016 1424   HGB 13.8 09/28/2017 1145   HCT 42.3 09/28/2017 1145   PLT 291 03/09/2016 1424   MCV 80 09/28/2017 1145   MCH 25.9 (L) 09/28/2017 1145   MCH 26.7 03/09/2016 1424   MCHC 32.6 09/28/2017 1145   MCHC 33.3 03/09/2016 1424   RDW 14.6 09/28/2017 1145   LYMPHSABS 1.7 09/28/2017 1145   EOSABS 0.1 09/28/2017 1145   BASOSABS 0.0 09/28/2017 1145   Iron/TIBC/Ferritin/ %Sat No results found for: IRON, TIBC, FERRITIN, IRONPCTSAT Lipid Panel     Component Value Date/Time   CHOL 206 (H) 09/28/2017 1145   TRIG 60 09/28/2017 1145   HDL 70 09/28/2017 1145   CHOLHDL 3.8 Ratio 01/09/2008 2228   VLDL  29 01/09/2008 2228   LDLCALC 124 (H) 09/28/2017 1145   Hepatic Function Panel     Component Value Date/Time   PROT 6.7 09/28/2017 1145   ALBUMIN 4.4 09/28/2017 1145   AST 22 09/28/2017 1145   ALT 27 09/28/2017 1145   ALKPHOS 106 09/28/2017 1145   BILITOT 0.4 09/28/2017 1145      Component Value Date/Time   TSH 0.471 09/28/2017 1145   TSH 0.499 02/15/2008 2052   TSH 0.106 (L) 01/09/2008 2228   Results for ANUP, BRIGHAM A (MRN [id number]) as of 10/12/2017 17:57  Ref. Range 09/28/2017 11:45  Vitamin D, 25-Hydroxy Latest Ref Range: 30.0 - 100.0 ng/mL 50.1   ASSESSMENT AND PLAN: Type 2 diabetes mellitus without complication, with long-term current use of insulin (HCC)  Other hyperlipidemia  Class 2 severe obesity with serious comorbidity and body mass index (BMI) of 39.0 to 39.9 in adult, unspecified obesity type ([place])  PLAN:  Diabetes II Damaso has been given extensive diabetes education by myself today including ideal fasting and post-prandial blood glucose readings, individual ideal HgbA1c goals and hypoglycemia prevention. We discussed the importance of good blood sugar control to decrease the likelihood of diabetic complications such as nephropathy, neuropathy, limb loss, blindness, coronary artery disease, and death. We discussed the importance of intensive lifestyle modification including diet, exercise and weight loss as the first line treatment for diabetes. Garald agrees to continue his diabetes medications and will follow up at the agreed upon  time. We have sent a prescription for 1 continuous glucose monitor kit. Finnbar has agreed to follow up in 2 weeks.  Hyperlipidemia Juanluis was informed of the American Heart Association Guidelines emphasizing intensive lifestyle modifications as the first line treatment for hyperlipidemia. We discussed many lifestyle modifications today in depth, and Torie will continue to work on decreasing saturated fats such as fatty red meat,  butter and many fried foods. He will also increase vegetables and lean protein in his diet and continue to work on exercise and weight loss efforts. I am increasing Pravastatin to 40 mg PO daily. Hadrian has enough pills to increase the dose and agrees to increase Pravastatin to 48m.  Obesity RKruzis currently in the action stage of change. As such, his goal is to continue with weight loss efforts. He has agreed to follow the Category 3 plan. RNaeemhas been instructed to work up to a goal of 150 minutes of combined cardio and strengthening exercise per week for weight loss and overall health benefits. We discussed the following Behavioral Modification Strategies today: increasing lean protein intake, increasing vegetables, work on meal planning and easy cooking plans, travel eating strategies, and planning for success.   RJerryhas agreed to follow up with our clinic in 2 weeks. He was informed of the importance of frequent follow up visits to maximize his success with intensive lifestyle modifications for his multiple health conditions.   OBESITY BEHAVIORAL INTERVENTION VISIT  Today's visit was # 2 out of 22.  Starting weight: 273  Starting date: 09/28/17 Today's weight : Weight: 277 lb (125.6 kg)  Today's date: 10/12/2017 Total lbs lost to date: 0    ASK: We discussed the diagnosis of obesity with RSuella Grovetoday and RLyriqagreed to give u[place]mission to discuss obesity behavioral modification therapy today.  ASSESS: RKlaytonhas the diagnosis of obesity and his BMI today is 39.75 RTaiis in the action stage of change.  ADVISE: RLumirwas educated on the multiple health risks of obesity as well as the benefit of weight loss to improve his health. He was advised of the need for long term treatment and the importance of lifestyle modifications.  AGREE: Multiple dietary modification options and treatment options were discussed and  RRaylenagreed to the above  obesity treatment plan.  I, CMarcille Blanco am acting as tLocation managerfor CDennard Nip MD  I have reviewed the above documentation for accuracy and completeness, and I agree with the above. - AIlene Qua MD

## 2017-10-13 MED ORDER — ACCU-CHEK AVIVA DEVI: 0 refills | Status: DC

## 2017-10-13 MED ORDER — GLUCOSE BLOOD VI STRP: ORAL_STRIP | 0 refills | Status: DC

## 2017-10-13 MED ORDER — ACCU-CHEK SAFE-T PRO LANCETS MISC: 0 refills | Status: DC

## 2017-10-26 ENCOUNTER — Ambulatory Visit (INDEPENDENT_AMBULATORY_CARE_PROVIDER_SITE_OTHER): Payer: Medicare Other | Admitting: Family Medicine

## 2017-10-26 ENCOUNTER — Encounter (INDEPENDENT_AMBULATORY_CARE_PROVIDER_SITE_OTHER): Payer: Self-pay | Admitting: Family Medicine

## 2017-10-26 VITALS — BP 128/69 | HR 84 | Temp 97.9°F | Ht 70.0 in | Wt 275.0 lb

## 2017-10-26 DIAGNOSIS — I10 Essential (primary) hypertension: Secondary | ICD-10-CM | POA: Diagnosis not present

## 2017-10-26 DIAGNOSIS — E119 Type 2 diabetes mellitus without complications: Secondary | ICD-10-CM | POA: Diagnosis not present

## 2017-10-26 DIAGNOSIS — Z794 Long term (current) use of insulin: Secondary | ICD-10-CM | POA: Diagnosis not present

## 2017-10-26 DIAGNOSIS — Z6839 Body mass index (BMI) 39.0-39.9, adult: Secondary | ICD-10-CM

## 2017-10-26 NOTE — Progress Notes (Signed)
Office: [phone]  /  Fax: [phone]   HPI:   Chief Complaint: OBESITY Max [name] is here to discuss his progress with his obesity treatment plan. He is on the Category 3 plan and is following his eating plan approximately 95 % of the time. He states he is exercising 0 minutes 0 times per week. Max [name]'s wife's sister came down and they ate out a few times. He is following meal plan for the most part. Max [name] is using cottage cheese and string cheese for a snack. His weight is 275 lb (124.7 kg) today and has had a weight loss of 2 pounds over a period of 2 weeks since his last visit. He has gained 2 lbs since starting treatment with [name].  Diabetes II Max [name] has a diagnosis of diabetes type II. Max [name] states BGs have been labile ranging between 100 and 150's and denies any hypoglycemic episodes. Last A1c was 8.3. Blood sugar is better controlled, but he did not bring his log or meter today. He is doing 30 units of 70/30 insulin. His highest blood sugar was 300, when he forgot to take his insulin.  Hemoglobin A1C Latest Ref Rng & Units 09/28/2017  HGBA1C 4.8 - 5.6 % 8.3(H)  Some recent data might be hidden    He has been working on intensive lifestyle modifications including diet, exercise, and weight loss to help control his blood glucose levels.  Hypertension Max [name] is a 72 y.o. male with hypertension. Max [name] denies chest pain or shortness of breath on exertion. He denies dizziness or lightheadedness. He is working weight loss to help control his blood pressure with the goal of decreasing his risk of heart attack and stroke. Max [name]'s blood pressure is currently controlled.   ALLERGIES: Allergies  Allergen Reactions  . Butane Anxiety and Other (See Comments)    Hallucinations REACTIONS ARE SIDE EFFECTS   . Gabapentin Other (See Comments)    Causes agression  . Hydromorphone Swelling    SWELLING REACTION UNSPECIFIED   . Tizanidine Swelling    SWELLING  REACTION UNSPECIFIED   . Topamax [Topiramate] Other (See Comments)    Hallucinations   . Buprenorphine Other (See Comments)    UNSPECIFIED REACTION   . Pork-Derived Products     Pt. Refuses pork derived products  . Latex Rash    Localized rash    MEDICATIONS: Current Outpatient Medications on File Prior to Visit  Medication Sig Dispense Refill  . alfuzosin (UROXATRAL) 10 MG 24 hr tablet Take 10 mg by mouth daily with breakfast.    . amLODipine (NORVASC) 10 MG tablet Take 10 mg by mouth daily.     . Blood Glucose Monitoring Suppl (ACCU-CHEK AVIVA) device Use as instructed 1 each 0  . Cholecalciferol (D3-1000 PO) Take 1 capsule by mouth daily.    . DULoxetine (CYMBALTA) 60 MG capsule Take 60 mg by mouth 2 (two) times daily.    . ferrous sulfate 325 (65 FE) MG EC tablet Take 325 mg by mouth daily with breakfast.    . Flaxseed Oil OIL 750 mg daily.    Marland Kitchen glucose blood (ACCU-CHEK AVIVA) test strip Use as instructed 100 each 0  . hydrochlorothiazide (HYDRODIURIL) 25 MG tablet Take 25 mg by mouth daily.    . hydrOXYzine (VISTARIL) 25 MG capsule   1  . Ibuprofen (ADVIL) 200 MG CAPS Take 2 capsules by mouth daily.    . insulin NPH Human (NOVOLIN N) 100 UNIT/ML injection Inject 0.05 mLs (5 Units  total) into the skin at bedtime. 10 mL 11  . insulin regular (NOVOLIN R RELION) 250 units/2.73mL (100 units/mL) injection INJECT 15 UNITS SUBCUTANEOUSLY THREE TIMES DAILY BEFORE MEAL(S) 20 mL 0  . Lancets (ACCU-CHEK SAFE-T PRO) lancets Use as instructed 100 each 0  . losartan (COZAAR) 100 MG tablet Take 100 mg by mouth daily.    . Magnesium 400 MG TABS Take 1 tablet by mouth daily.    . naproxen sodium (ANAPROX) 220 MG tablet Take 220 mg by mouth daily as needed (for pain).     Marland Kitchen oxyCODONE (ROXICODONE) 15 MG immediate release tablet Take 10 mg by mouth 3 (three) times daily.     . Potassium 99 MG TABS Take 1 tablet by mouth daily.    . pravastatin (PRAVACHOL) 20 MG tablet Take 40 mg by mouth at bedtime.      . Probiotic Product (PROBIOTIC-10 PO) Take 1 capsule by mouth daily.    . tamsulosin (FLOMAX) 0.4 MG CAPS capsule Take 0.4 mg by mouth daily.     Marland Kitchen topiramate (TOPAMAX) 25 MG tablet Take 25 mg by mouth 2 (two) times daily.    . [name] 1.5 HC/6.2BJ SOPN     . vitamin B-12 (CYANOCOBALAMIN) 1000 MCG tablet Take 1,000 mcg by mouth daily.     No current facility-administered medications on file prior to visit.     PAST MEDICAL HISTORY: Past Medical History:  Diagnosis Date  . ADD (attention deficit disorder)   . Anxiety   . Back pain   . Bipolar 1 disorder ([place])   . Chronic pain   . Constipation   . Decreased hearing   . Depression   . Diabetes mellitus without complication ([place])    Type II  . Dry skin   . DVT (deep venous thrombosis) ([place]) 2004   leg (not sure which leg)  . Dyslipidemia   . Dyspnea    with exertion  . Ear drainage   . Excessive hunger   . Excessive thirst   . Eye pain   . Fatigue   . Frequent urination   . Hard of hearing   . Hyperlipidemia   . Hypertension   . Itching   . Joint pain   . Lactose intolerance   . Leg cramps   . Mouth sores   . Nasal and sinus discharge   . Nausea   . Numbness and tingling    bilateral  . OSA (obstructive sleep apnea)    wears CPAP  . Shortness of breath   . Shortness of breath on exertion   . Sinus congestion   . Stiff neck   . Stress   . Swallowing difficulty   . Swelling of extremity   . Tinnitus   . Trouble in sleeping   . Urinary frequency   . Vision changes   . Weakness   . Wears glasses     PAST SURGICAL HISTORY: Past Surgical History:  Procedure Laterality Date  . arm surgery Right   . BACK SURGERY  2004  . COLONOSCOPY    . ESOPHAGOGASTRODUODENOSCOPY    . EYE SURGERY Right    cataract  . SUBMANDIBULAR GLAND EXCISION Right 03/10/2016   Procedure: INTRAORAL  SUBMANDIBULAR  STONE REMOVAL;  Surgeon: Izora Gala, MD;  Location: [facility];  Service: ENT;  Laterality: Right;    SOCIAL  HISTORY: Social History   Tobacco Use  . Smoking status: Never Smoker  . Smokeless tobacco: Never Used  Substance Use  Topics  . Alcohol use: No  . Drug use: No    FAMILY HISTORY: Family History  Problem Relation Age of Onset  . Diabetes Mother   . Hypertension Mother   . Hyperlipidemia Mother   . Kidney disease Mother   . Cancer Mother   . Diabetes Brother     ROS: Review of Systems  Constitutional: Positive for weight loss.  Cardiovascular: Negative for chest pain.       Negative for shortness of breath with exertion  Neurological: Negative for dizziness.       Negative for lightheadedness  Endo/Heme/Allergies:       Positive for hyperglycemia    PHYSICAL EXAM: Blood pressure 128/69, pulse 84, temperature 97.9 F (36.6 C), temperature source Oral, height 5\' 10"  (1.778 m), weight 275 lb (124.7 kg), SpO2 96 %. Body mass index is 39.46 kg/m. Physical Exam  Constitutional: He is oriented to person, place, and time. He appears well-developed and well-nourished.  Cardiovascular: Normal rate.  Pulmonary/Chest: Effort normal.  Musculoskeletal: Normal range of motion.  Neurological: He is oriented to person, place, and time.  Skin: Skin is warm and dry.  Psychiatric: He has a normal mood and affect. His behavior is normal.    RECENT LABS AND TESTS: BMET    Component Value Date/Time   NA 139 09/28/2017 1145   K 4.0 09/28/2017 1145   CL 100 09/28/2017 1145   CO2 24 09/28/2017 1145   GLUCOSE 127 (H) 09/28/2017 1145   GLUCOSE 208 (H) 03/09/2016 1424   BUN 15 09/28/2017 1145   CREATININE 0.91 09/28/2017 1145   CALCIUM 9.4 09/28/2017 1145   GFRNONAA 86 09/28/2017 1145   GFRAA 99 09/28/2017 1145   Lab Results  Component Value Date   HGBA1C 8.3 (H) 09/28/2017   HGBA1C 7.2 (H) 03/09/2016   HGBA1C 7.9 08/12/2015   No results found for: INSULIN CBC    Component Value Date/Time   WBC 10.8 09/28/2017 1145   WBC 11.6 (H) 03/09/2016 1424   RBC 5.32 09/28/2017 1145    RBC 5.17 03/09/2016 1424   HGB 13.8 09/28/2017 1145   HCT 42.3 09/28/2017 1145   PLT 291 03/09/2016 1424   MCV 80 09/28/2017 1145   MCH 25.9 (L) 09/28/2017 1145   MCH 26.7 03/09/2016 1424   MCHC 32.6 09/28/2017 1145   MCHC 33.3 03/09/2016 1424   RDW 14.6 09/28/2017 1145   LYMPHSABS 1.7 09/28/2017 1145   EOSABS 0.1 09/28/2017 1145   BASOSABS 0.0 09/28/2017 1145   Iron/TIBC/Ferritin/ %Sat No results found for: IRON, TIBC, FERRITIN, IRONPCTSAT Lipid Panel     Component Value Date/Time   CHOL 206 (H) 09/28/2017 1145   TRIG 60 09/28/2017 1145   HDL 70 09/28/2017 1145   CHOLHDL 3.8 Ratio 01/09/2008 2228   VLDL 29 01/09/2008 2228   LDLCALC 124 (H) 09/28/2017 1145   Hepatic Function Panel     Component Value Date/Time   PROT 6.7 09/28/2017 1145   ALBUMIN 4.4 09/28/2017 1145   AST 22 09/28/2017 1145   ALT 27 09/28/2017 1145   ALKPHOS 106 09/28/2017 1145   BILITOT 0.4 09/28/2017 1145      Component Value Date/Time   TSH 0.471 09/28/2017 1145   TSH 0.499 02/15/2008 2052   TSH 0.106 (L) 01/09/2008 2228   Results for [place], RUMPLE A (MRN [id number]) as of 10/26/2017 15:42  Ref. Range 09/28/2017 11:45  Vitamin D, 25-Hydroxy Latest Ref Range: 30.0 - 100.0 ng/mL 50.1    ASSESSMENT AND  PLAN: Type 2 diabetes mellitus without complication, with long-term current use of insulin (HCC)  Essential hypertension  Class 2 severe obesity with serious comorbidity and body mass index (BMI) of 39.0 to 39.9 in adult, unspecified obesity type ([place])  PLAN: Diabetes II Dow has been given extensive diabetes education by myself today including ideal fasting and post-prandial blood glucose readings, individual ideal Hgb A1c goals and hypoglycemia prevention. We discussed the importance of good blood sugar control to decrease the likelihood of diabetic complications such as nephropathy, neuropathy, limb loss, blindness, coronary artery disease, and death. We discussed the importance of  intensive lifestyle modification including diet, exercise and weight loss as the first line treatment for diabetes. Nelton agrees to continue his 70/30 insulin and will follow up in 2 weeks. Makhai will bring his blood sugar log to the next appointment.  Hypertension We discussed sodium restriction, working on healthy weight loss, and a regular exercise program as the means to achieve improved blood pressure control. Trentyn agreed to continue current medications as prescribed and agreed to follow up as directed. We will continue to monitor his blood pressure as well as his progress with the above lifestyle modifications. He will continue his medications as prescribed and will watch for signs of hypotension as he continues his lifestyle modifications.  We spent > than 50% of the 15 minute visit on the counseling as documented in the note.  Obesity Shamarr is currently in the action stage of change. As such, his goal is to continue with weight loss efforts. He has agreed to follow the Category 3 plan. Ebony has been instructed to work up to a goal of 150 minutes of combined cardio and strengthening exercise per week for weight loss and overall health benefits. We discussed the following Behavioral Modification Stratagies today: increasing lean protein intake, increasing vegetables, work on meal planning and easy cooking plans, and planning for success.  Jylan has agreed to follow up with our clinic in 2 weeks. He was informed of the importance of frequent follow up visits to maximize his success with intensive lifestyle modifications for his multiple health conditions.   OBESITY BEHAVIORAL INTERVENTION VISIT  Today's visit was # 3 out of 22.  Starting weight: 273 lbs Starting date: 09/28/17 Today's weight : Weight: 275 lb (124.7 kg)  Today's date: 10/26/2017 Total lbs lost to date: 0    ASK: We discussed the diagnosis of obesity with Max [name] today and Ceasar agreed to give  [name] permission to discuss obesity behavioral modification therapy today.  ASSESS: Kalijah has the diagnosis of obesity and his BMI today is 39.46 Acelin is in the action stage of change.   ADVISE: Thurlow was educated on the multiple health risks of obesity as well as the benefit of weight loss to improve his health. He was advised of the need for long term treatment and the importance of lifestyle modifications.  AGREE: Multiple dietary modification options and treatment options were discussed and Hargis agreed to the above obesity treatment plan.  I, Marcille Blanco, am acting as Location manager for Eber Jones, MD  I have reviewed the above documentation for accuracy and completeness, and I agree with the above. - Ilene Qua, MD

## 2017-10-31 DIAGNOSIS — G4733 Obstructive sleep apnea (adult) (pediatric): Secondary | ICD-10-CM | POA: Diagnosis not present

## 2017-11-15 ENCOUNTER — Ambulatory Visit (INDEPENDENT_AMBULATORY_CARE_PROVIDER_SITE_OTHER): Payer: Medicare Other | Admitting: Family Medicine

## 2017-11-15 ENCOUNTER — Encounter (INDEPENDENT_AMBULATORY_CARE_PROVIDER_SITE_OTHER): Payer: Self-pay | Admitting: Family Medicine

## 2017-11-15 ENCOUNTER — Ambulatory Visit (INDEPENDENT_AMBULATORY_CARE_PROVIDER_SITE_OTHER): Payer: Self-pay | Admitting: Family Medicine

## 2017-11-15 VITALS — BP 158/91 | HR 81 | Temp 98.8°F | Ht 70.0 in | Wt 279.0 lb

## 2017-11-15 DIAGNOSIS — Z6841 Body Mass Index (BMI) 40.0 and over, adult: Secondary | ICD-10-CM

## 2017-11-15 DIAGNOSIS — Z794 Long term (current) use of insulin: Secondary | ICD-10-CM

## 2017-11-15 DIAGNOSIS — M549 Dorsalgia, unspecified: Secondary | ICD-10-CM | POA: Diagnosis not present

## 2017-11-15 DIAGNOSIS — G8929 Other chronic pain: Secondary | ICD-10-CM

## 2017-11-15 DIAGNOSIS — I10 Essential (primary) hypertension: Secondary | ICD-10-CM | POA: Diagnosis not present

## 2017-11-15 DIAGNOSIS — E119 Type 2 diabetes mellitus without complications: Secondary | ICD-10-CM

## 2017-11-15 MED ORDER — ONETOUCH VERIO W/DEVICE KIT: 1.0000 | PACK | Freq: Every day | 0 refills | Status: DC

## 2017-11-15 NOTE — Progress Notes (Signed)
Office: [phone]  /  Fax: [phone]   HPI:   Chief Complaint: OBESITY Max [name] is here to discuss his progress with his obesity treatment plan. He is on the Category 3 plan and is following his eating plan approximately 60 % of the time. He states he is exercising 0 minutes 0 times per week. Max [name] has noticed, that he has been doing some sad emotional eating and increasing portions of food on the plan and thus may be overeating. Max [name] has had an increase in back pain recently. His weight is 279 lb (126.6 kg) today and has not lost weight since his last visit. He has lost 0 lbs since starting treatment with [name].  Diabetes II Max [name] has a diagnosis of diabetes type II. Max [name] is on insulin 70/30 and fasting BGs are unknown, as he hasn't kept a blood sugar log; however he has brought his glucometer in to his visit today and his fasting BGs ranged between 80 and 300. Patient is taking 40/60 units of 70/30 insulin twice daily. Max [name] denies any hypoglycemic episodes. Last A1c was at 8.3 He has been working on intensive lifestyle modifications including diet, exercise, and weight loss to help control his blood glucose levels.  Chronic Back Pain Max [name] has had a recent increase in back pain and he changed medications from levorphanol to morphine.  Hypertension Max [name] is a 72 y.o. male with hypertension. His blood pressure is slightly elevated today, but patient is in significant pain. Max [name] denies chest pain or shortness of breath on exertion. He is working weight loss to help control his blood pressure with the goal of decreasing his risk of heart attack and stroke. Max [name] blood pressure is not currently controlled.  ALLERGIES: Allergies  Allergen Reactions  . Butane Anxiety and Other (See Comments)    Hallucinations REACTIONS ARE SIDE EFFECTS   . Gabapentin Other (See Comments)    Causes agression  . Hydromorphone Swelling    SWELLING REACTION  UNSPECIFIED   . Tizanidine Swelling    SWELLING REACTION UNSPECIFIED   . Topamax [Topiramate] Other (See Comments)    Hallucinations   . Buprenorphine Other (See Comments)    UNSPECIFIED REACTION   . Pork-Derived Products     Pt. Refuses pork derived products  . Latex Rash    Localized rash    MEDICATIONS: Current Outpatient Medications on File Prior to Visit  Medication Sig Dispense Refill  . alfuzosin (UROXATRAL) 10 MG 24 hr tablet Take 10 mg by mouth daily with breakfast.    . amLODipine (NORVASC) 10 MG tablet Take 10 mg by mouth daily.     . Cholecalciferol (D3-1000 PO) Take 1 capsule by mouth daily.    . DULoxetine (CYMBALTA) 60 MG capsule Take 60 mg by mouth 2 (two) times daily.    . ferrous sulfate 325 (65 FE) MG EC tablet Take 325 mg by mouth daily with breakfast.    . Flaxseed Oil OIL 750 mg daily.    Marland Kitchen glucose blood (ACCU-CHEK AVIVA) test strip Use as instructed 100 each 0  . hydrochlorothiazide (HYDRODIURIL) 25 MG tablet Take 25 mg by mouth daily.    . hydrOXYzine (VISTARIL) 25 MG capsule   1  . Ibuprofen (ADVIL) 200 MG CAPS Take 2 capsules by mouth daily.    . insulin NPH Human (NOVOLIN N) 100 UNIT/ML injection Inject 0.05 mLs (5 Units total) into the skin at bedtime. 10 mL 11  . insulin regular (NOVOLIN R RELION) 250 units/2.82m (  100 units/mL) injection INJECT 15 UNITS SUBCUTANEOUSLY THREE TIMES DAILY BEFORE MEAL(S) 20 mL 0  . Lancets (ACCU-CHEK SAFE-T PRO) lancets Use as instructed 100 each 0  . losartan (COZAAR) 100 MG tablet Take 100 mg by mouth daily.    . Magnesium 400 MG TABS Take 1 tablet by mouth daily.    . naproxen sodium (ANAPROX) 220 MG tablet Take 220 mg by mouth daily as needed (for pain).     Marland Kitchen oxyCODONE (ROXICODONE) 15 MG immediate release tablet Take 10 mg by mouth 3 (three) times daily.     . Potassium 99 MG TABS Take 1 tablet by mouth daily.    . pravastatin (PRAVACHOL) 20 MG tablet Take 40 mg by mouth at bedtime.     . Probiotic Product  (PROBIOTIC-10 PO) Take 1 capsule by mouth daily.    . tamsulosin (FLOMAX) 0.4 MG CAPS capsule Take 0.4 mg by mouth daily.     Marland Kitchen topiramate (TOPAMAX) 25 MG tablet Take 25 mg by mouth 2 (two) times daily.    . [name] 1.5 WE/9.9BZ SOPN     . vitamin B-12 (CYANOCOBALAMIN) 1000 MCG tablet Take 1,000 mcg by mouth daily.     No current facility-administered medications on file prior to visit.     PAST MEDICAL HISTORY: Past Medical History:  Diagnosis Date  . ADD (attention deficit disorder)   . Anxiety   . Back pain   . Bipolar 1 disorder ([place])   . Chronic pain   . Constipation   . Decreased hearing   . Depression   . Diabetes mellitus without complication ([place])    Type II  . Dry skin   . DVT (deep venous thrombosis) ([place]) 2004   leg (not sure which leg)  . Dyslipidemia   . Dyspnea    with exertion  . Ear drainage   . Excessive hunger   . Excessive thirst   . Eye pain   . Fatigue   . Frequent urination   . Hard of hearing   . Hyperlipidemia   . Hypertension   . Itching   . Joint pain   . Lactose intolerance   . Leg cramps   . Mouth sores   . Nasal and sinus discharge   . Nausea   . Numbness and tingling    bilateral  . OSA (obstructive sleep apnea)    wears CPAP  . Shortness of breath   . Shortness of breath on exertion   . Sinus congestion   . Stiff neck   . Stress   . Swallowing difficulty   . Swelling of extremity   . Tinnitus   . Trouble in sleeping   . Urinary frequency   . Vision changes   . Weakness   . Wears glasses     PAST SURGICAL HISTORY: Past Surgical History:  Procedure Laterality Date  . arm surgery Right   . BACK SURGERY  2004  . COLONOSCOPY    . ESOPHAGOGASTRODUODENOSCOPY    . EYE SURGERY Right    cataract  . SUBMANDIBULAR GLAND EXCISION Right 03/10/2016   Procedure: INTRAORAL  SUBMANDIBULAR  STONE REMOVAL;  Surgeon: Izora Gala, MD;  Location: [facility];  Service: ENT;  Laterality: Right;    SOCIAL HISTORY: Social History    Tobacco Use  . Smoking status: Never Smoker  . Smokeless tobacco: Never Used  Substance Use Topics  . Alcohol use: No  . Drug use: No    FAMILY HISTORY: Family History  Problem Relation Age of Onset  . Diabetes Mother   . Hypertension Mother   . Hyperlipidemia Mother   . Kidney disease Mother   . Cancer Mother   . Diabetes Brother     ROS: Review of Systems  Constitutional: Positive for weight loss.  Respiratory: Negative for shortness of breath (on exertion).   Cardiovascular: Negative for chest pain.  Musculoskeletal: Positive for back pain.  Endo/Heme/Allergies:       Negative for hypoglycemia    PHYSICAL EXAM: Blood pressure (!) 158/91, pulse 81, temperature 98.8 F (37.1 C), temperature source Oral, height 5' 10"  (1.778 m), weight 279 lb (126.6 kg), SpO2 98 %. Body mass index is 40.03 kg/m. Physical Exam  Constitutional: He is oriented to person, place, and time. He appears well-developed and well-nourished.  Cardiovascular: Normal rate.  Pulmonary/Chest: Effort normal.  Musculoskeletal: Normal range of motion.  Neurological: He is oriented to person, place, and time.  Skin: Skin is warm and dry.  Psychiatric: He has a normal mood and affect. His behavior is normal.  Vitals reviewed.   RECENT LABS AND TESTS: BMET    Component Value Date/Time   NA 139 09/28/2017 1145   K 4.0 09/28/2017 1145   CL 100 09/28/2017 1145   CO2 24 09/28/2017 1145   GLUCOSE 127 (H) 09/28/2017 1145   GLUCOSE 208 (H) 03/09/2016 1424   BUN 15 09/28/2017 1145   CREATININE 0.91 09/28/2017 1145   CALCIUM 9.4 09/28/2017 1145   GFRNONAA 86 09/28/2017 1145   GFRAA 99 09/28/2017 1145   Lab Results  Component Value Date   HGBA1C 8.3 (H) 09/28/2017   HGBA1C 7.2 (H) 03/09/2016   HGBA1C 7.9 08/12/2015   No results found for: INSULIN CBC    Component Value Date/Time   WBC 10.8 09/28/2017 1145   WBC 11.6 (H) 03/09/2016 1424   RBC 5.32 09/28/2017 1145   RBC 5.17 03/09/2016  1424   HGB 13.8 09/28/2017 1145   HCT 42.3 09/28/2017 1145   PLT 291 03/09/2016 1424   MCV 80 09/28/2017 1145   MCH 25.9 (L) 09/28/2017 1145   MCH 26.7 03/09/2016 1424   MCHC 32.6 09/28/2017 1145   MCHC 33.3 03/09/2016 1424   RDW 14.6 09/28/2017 1145   LYMPHSABS 1.7 09/28/2017 1145   EOSABS 0.1 09/28/2017 1145   BASOSABS 0.0 09/28/2017 1145   Iron/TIBC/Ferritin/ %Sat No results found for: IRON, TIBC, FERRITIN, IRONPCTSAT Lipid Panel     Component Value Date/Time   CHOL 206 (H) 09/28/2017 1145   TRIG 60 09/28/2017 1145   HDL 70 09/28/2017 1145   CHOLHDL 3.8 Ratio 01/09/2008 2228   VLDL 29 01/09/2008 2228   LDLCALC 124 (H) 09/28/2017 1145   Hepatic Function Panel     Component Value Date/Time   PROT 6.7 09/28/2017 1145   ALBUMIN 4.4 09/28/2017 1145   AST 22 09/28/2017 1145   ALT 27 09/28/2017 1145   ALKPHOS 106 09/28/2017 1145   BILITOT 0.4 09/28/2017 1145      Component Value Date/Time   TSH 0.471 09/28/2017 1145   TSH 0.499 02/15/2008 2052   TSH 0.106 (L) 01/09/2008 2228   Results for BARON, PARMELEE A (MRN [id number]) as of 11/15/2017 16:16  Ref. Range 09/28/2017 11:45  Vitamin D, 25-Hydroxy Latest Ref Range: 30.0 - 100.0 ng/mL 50.1   ASSESSMENT AND PLAN: Type 2 diabetes mellitus without complication, with long-term current use of insulin (HCC) - Plan: Blood Glucose Monitoring Suppl (ONETOUCH VERIO) w/Device KIT  Other chronic back  pain  Essential hypertension  Class 3 severe obesity with serious comorbidity and body mass index (BMI) of 40.0 to 44.9 in adult, unspecified obesity type ([place])  PLAN:  Diabetes II Max [name] has been given extensive diabetes education by myself today including ideal fasting and post-prandial blood glucose readings, individual ideal Hgb A1c goals  and hypoglycemia prevention. We discussed the importance of good blood sugar control to decrease the likelihood of diabetic complications such as nephropathy, neuropathy, limb loss,  blindness, coronary artery disease, and death. We discussed the importance of intensive lifestyle modification including diet, exercise and weight loss as the first line treatment for diabetes. Max [name] agrees to continue his diabetes medications and will follow up at the agreed upon time.  Chronic Back Pain Max [name] is to follow up with his pain management physician on 12/01/17. He agrees to follow up with our clinic as directed.  Hypertension We discussed sodium restriction, working on healthy weight loss, and a regular exercise program as the means to achieve improved blood pressure control. Max [name] agreed with this plan and agreed to follow up as directed. We will follow up at the next appointment and will continue to monitor his blood pressure as well as his progress with the above lifestyle modifications. He will continue his medications as prescribed and will watch for signs of hypotension as he continues his lifestyle modifications.  Obesity Max [name] is currently in the action stage of change. As such, his goal is to continue with weight loss efforts He has agreed to follow the Category 3 plan Max [name] has been instructed to work up to a goal of 150 minutes of combined cardio and strengthening exercise per week for weight loss and overall health benefits. We discussed the following Behavioral Modification Strategies today: planning for success, increasing lean protein intake, increasing vegetables and work on meal planning and easy cooking plans  Max [name] has agreed to follow up with our clinic in 2 weeks. He was informed of the importance of frequent follow up visits to maximize his success with intensive lifestyle modifications for his multiple health conditions.   OBESITY BEHAVIORAL INTERVENTION VISIT  Today's visit was # 4   Starting weight: 273 lbs Starting date: 09/28/17 Today's weight : 279 lbs Today's date: 11/15/2017 Total lbs lost to date: 0 At least 15 minutes were spent on  discussing the following behavioral intervention visit.   ASK: We discussed the diagnosis of obesity with Max [name] today and Max [name] agreed to give [name] permission to discuss obesity behavioral modification therapy today.  ASSESS: Max [name] has the diagnosis of obesity and his BMI today is 40.03 Max [name] is in the action stage of change   ADVISE: Max [name] was educated on the multiple health risks of obesity as well as the benefit of weight loss to improve his health. He was advised of the need for long term treatment and the importance of lifestyle modifications to improve his current health and to decrease his risk of future health problems.  AGREE: Multiple dietary modification options and treatment options were discussed and  Max [name] agreed to follow the recommendations documented in the above note.  ARRANGE: Max [name] was educated on the importance of frequent visits to treat obesity as outlined per CMS and USPSTF guidelines and agreed to schedule his next follow up appointment today.  I, Doreene Nest, am acting as Location manager for Eber Jones  I have reviewed the above documentation for accuracy and completeness, and I agree with the above. - Ilene Qua, MD

## 2017-11-21 DIAGNOSIS — G4733 Obstructive sleep apnea (adult) (pediatric): Secondary | ICD-10-CM | POA: Diagnosis not present

## 2017-11-21 DIAGNOSIS — I10 Essential (primary) hypertension: Secondary | ICD-10-CM | POA: Diagnosis not present

## 2017-11-21 DIAGNOSIS — E785 Hyperlipidemia, unspecified: Secondary | ICD-10-CM | POA: Diagnosis not present

## 2017-11-30 ENCOUNTER — Encounter (INDEPENDENT_AMBULATORY_CARE_PROVIDER_SITE_OTHER): Payer: Self-pay

## 2017-11-30 ENCOUNTER — Ambulatory Visit (INDEPENDENT_AMBULATORY_CARE_PROVIDER_SITE_OTHER): Payer: Medicare Other | Admitting: Family Medicine

## 2017-12-01 DIAGNOSIS — G4733 Obstructive sleep apnea (adult) (pediatric): Secondary | ICD-10-CM | POA: Diagnosis not present

## 2017-12-05 ENCOUNTER — Ambulatory Visit (INDEPENDENT_AMBULATORY_CARE_PROVIDER_SITE_OTHER): Payer: Medicare Other | Admitting: Family Medicine

## 2017-12-05 VITALS — BP 124/70 | HR 94 | Temp 98.6°F | Ht 70.0 in | Wt 276.0 lb

## 2017-12-05 DIAGNOSIS — E119 Type 2 diabetes mellitus without complications: Secondary | ICD-10-CM | POA: Diagnosis not present

## 2017-12-05 DIAGNOSIS — Z794 Long term (current) use of insulin: Secondary | ICD-10-CM

## 2017-12-05 DIAGNOSIS — I10 Essential (primary) hypertension: Secondary | ICD-10-CM | POA: Diagnosis not present

## 2017-12-05 DIAGNOSIS — Z6839 Body mass index (BMI) 39.0-39.9, adult: Secondary | ICD-10-CM

## 2017-12-05 NOTE — Progress Notes (Signed)
Office: [phone]  /  Fax: [phone]   HPI:   Chief Complaint: OBESITY Max [name] is here to discuss his progress with his obesity treatment plan. He is on the Category 3 plan and is following his eating plan approximately 75 % of the time. He states he is exercising 0 minutes 0 times per week. Max [name] is struggling with pain and transitioning to a new medication. He doesn't like milk products and feels he is swelling especially with cheese. His sister just passed away.  His weight is 276 lb (125.2 kg) today and has had a weight loss of 3 pounds over a period of 3 weeks since his last visit. He has lost 0 lbs since starting treatment with [name].  Diabetes II Max [name] has a diagnosis of diabetes type II. Max [name] states fasting BGs range between 77 and 130's and post prandials range between 200 and 300's. He is taking 45-60 units of 70/30. He denies any hypoglycemic episodes. Last A1c was 8.3. He has been working on intensive lifestyle modifications including diet, exercise, and weight loss to help control his blood glucose levels.  Hypertension Max [name] is a 72 y.o. male with hypertension. Max [name]'s blood pressure is controlled. He denies chest pain, chest pressure, or headache. He is working weight loss to help control his blood pressure with the goal of decreasing his risk of heart attack and stroke.   ALLERGIES: Allergies  Allergen Reactions  . Butane Anxiety and Other (See Comments)    Hallucinations REACTIONS ARE SIDE EFFECTS   . Gabapentin Other (See Comments)    Causes agression  . Hydromorphone Swelling    SWELLING REACTION UNSPECIFIED   . Tizanidine Swelling    SWELLING REACTION UNSPECIFIED   . Topamax [Topiramate] Other (See Comments)    Hallucinations   . Buprenorphine Other (See Comments)    UNSPECIFIED REACTION   . Pork-Derived Products     Pt. Refuses pork derived products  . Latex Rash    Localized rash    MEDICATIONS: Current Outpatient Medications  on File Prior to Visit  Medication Sig Dispense Refill  . alfuzosin (UROXATRAL) 10 MG 24 hr tablet Take 10 mg by mouth daily with breakfast.    . amLODipine (NORVASC) 10 MG tablet Take 10 mg by mouth daily.     . Blood Glucose Monitoring Suppl (ONETOUCH VERIO) w/Device KIT 1 kit by Does not apply route daily at 12 noon. Continuous Glucose Monitoring Kit 1 kit 0  . Cholecalciferol (D3-1000 PO) Take 1 capsule by mouth daily.    . DULoxetine (CYMBALTA) 60 MG capsule Take 60 mg by mouth 2 (two) times daily.    . ferrous sulfate 325 (65 FE) MG EC tablet Take 325 mg by mouth daily with breakfast.    . Flaxseed Oil OIL 750 mg daily.    Marland Kitchen glucose blood (ACCU-CHEK AVIVA) test strip Use as instructed 100 each 0  . hydrochlorothiazide (HYDRODIURIL) 25 MG tablet Take 25 mg by mouth daily.    . hydrOXYzine (VISTARIL) 25 MG capsule   1  . Ibuprofen (ADVIL) 200 MG CAPS Take 2 capsules by mouth daily.    . insulin NPH Human (NOVOLIN N) 100 UNIT/ML injection Inject 0.05 mLs (5 Units total) into the skin at bedtime. 10 mL 11  . insulin regular (NOVOLIN R RELION) 250 units/2.73m (100 units/mL) injection INJECT 15 UNITS SUBCUTANEOUSLY THREE TIMES DAILY BEFORE MEAL(S) 20 mL 0  . Lancets (ACCU-CHEK SAFE-T PRO) lancets Use as instructed 100 each 0  .  losartan (COZAAR) 100 MG tablet Take 100 mg by mouth daily.    . Magnesium 400 MG TABS Take 1 tablet by mouth daily.    . naproxen sodium (ANAPROX) 220 MG tablet Take 220 mg by mouth daily as needed (for pain).     Marland Kitchen oxyCODONE (ROXICODONE) 15 MG immediate release tablet Take 10 mg by mouth 3 (three) times daily.     . Potassium 99 MG TABS Take 1 tablet by mouth daily.    . pravastatin (PRAVACHOL) 20 MG tablet Take 40 mg by mouth at bedtime.     . Probiotic Product (PROBIOTIC-10 PO) Take 1 capsule by mouth daily.    . tamsulosin (FLOMAX) 0.4 MG CAPS capsule Take 0.4 mg by mouth daily.     Marland Kitchen topiramate (TOPAMAX) 25 MG tablet Take 25 mg by mouth 2 (two) times daily.    .  [name] 1.5 KP/5.4SF SOPN     . vitamin B-12 (CYANOCOBALAMIN) 1000 MCG tablet Take 1,000 mcg by mouth daily.     No current facility-administered medications on file prior to visit.     PAST MEDICAL HISTORY: Past Medical History:  Diagnosis Date  . ADD (attention deficit disorder)   . Anxiety   . Back pain   . Bipolar 1 disorder ([place])   . Chronic pain   . Constipation   . Decreased hearing   . Depression   . Diabetes mellitus without complication ([place])    Type II  . Dry skin   . DVT (deep venous thrombosis) ([place]) 2004   leg (not sure which leg)  . Dyslipidemia   . Dyspnea    with exertion  . Ear drainage   . Excessive hunger   . Excessive thirst   . Eye pain   . Fatigue   . Frequent urination   . Hard of hearing   . Hyperlipidemia   . Hypertension   . Itching   . Joint pain   . Lactose intolerance   . Leg cramps   . Mouth sores   . Nasal and sinus discharge   . Nausea   . Numbness and tingling    bilateral  . OSA (obstructive sleep apnea)    wears CPAP  . Shortness of breath   . Shortness of breath on exertion   . Sinus congestion   . Stiff neck   . Stress   . Swallowing difficulty   . Swelling of extremity   . Tinnitus   . Trouble in sleeping   . Urinary frequency   . Vision changes   . Weakness   . Wears glasses     PAST SURGICAL HISTORY: Past Surgical History:  Procedure Laterality Date  . arm surgery Right   . BACK SURGERY  2004  . COLONOSCOPY    . ESOPHAGOGASTRODUODENOSCOPY    . EYE SURGERY Right    cataract  . SUBMANDIBULAR GLAND EXCISION Right 03/10/2016   Procedure: INTRAORAL  SUBMANDIBULAR  STONE REMOVAL;  Surgeon: Izora Gala, MD;  Location: [facility];  Service: ENT;  Laterality: Right;    SOCIAL HISTORY: Social History   Tobacco Use  . Smoking status: Never Smoker  . Smokeless tobacco: Never Used  Substance Use Topics  . Alcohol use: No  . Drug use: No    FAMILY HISTORY: Family History  Problem Relation Age of Onset  .  Diabetes Mother   . Hypertension Mother   . Hyperlipidemia Mother   . Kidney disease Mother   . Cancer  Mother   . Diabetes Brother     ROS: Review of Systems  Constitutional: Positive for weight loss.  Cardiovascular: Negative for chest pain.       Negative chest pressure  Neurological: Negative for headaches.  Endo/Heme/Allergies:       Negative hypoglycemia    PHYSICAL EXAM: Blood pressure 124/70, pulse 94, temperature 98.6 F (37 C), temperature source Oral, height 5' 10"  (1.778 m), weight 276 lb (125.2 kg), SpO2 97 %. Body mass index is 39.6 kg/m. Physical Exam  Constitutional: He is oriented to person, place, and time. He appears well-developed and well-nourished.  Cardiovascular: Normal rate.  Pulmonary/Chest: Effort normal.  Musculoskeletal: Normal range of motion.  Neurological: He is oriented to person, place, and time.  Skin: Skin is warm and dry.  Psychiatric: He has a normal mood and affect. His behavior is normal.  Vitals reviewed.   RECENT LABS AND TESTS: BMET    Component Value Date/Time   NA 139 09/28/2017 1145   K 4.0 09/28/2017 1145   CL 100 09/28/2017 1145   CO2 24 09/28/2017 1145   GLUCOSE 127 (H) 09/28/2017 1145   GLUCOSE 208 (H) 03/09/2016 1424   BUN 15 09/28/2017 1145   CREATININE 0.91 09/28/2017 1145   CALCIUM 9.4 09/28/2017 1145   GFRNONAA 86 09/28/2017 1145   GFRAA 99 09/28/2017 1145   Lab Results  Component Value Date   HGBA1C 8.3 (H) 09/28/2017   HGBA1C 7.2 (H) 03/09/2016   HGBA1C 7.9 08/12/2015   No results found for: INSULIN CBC    Component Value Date/Time   WBC 10.8 09/28/2017 1145   WBC 11.6 (H) 03/09/2016 1424   RBC 5.32 09/28/2017 1145   RBC 5.17 03/09/2016 1424   HGB 13.8 09/28/2017 1145   HCT 42.3 09/28/2017 1145   PLT 291 03/09/2016 1424   MCV 80 09/28/2017 1145   MCH 25.9 (L) 09/28/2017 1145   MCH 26.7 03/09/2016 1424   MCHC 32.6 09/28/2017 1145   MCHC 33.3 03/09/2016 1424   RDW 14.6 09/28/2017 1145    LYMPHSABS 1.7 09/28/2017 1145   EOSABS 0.1 09/28/2017 1145   BASOSABS 0.0 09/28/2017 1145   Iron/TIBC/Ferritin/ %Sat No results found for: IRON, TIBC, FERRITIN, IRONPCTSAT Lipid Panel     Component Value Date/Time   CHOL 206 (H) 09/28/2017 1145   TRIG 60 09/28/2017 1145   HDL 70 09/28/2017 1145   CHOLHDL 3.8 Ratio 01/09/2008 2228   VLDL 29 01/09/2008 2228   LDLCALC 124 (H) 09/28/2017 1145   Hepatic Function Panel     Component Value Date/Time   PROT 6.7 09/28/2017 1145   ALBUMIN 4.4 09/28/2017 1145   AST 22 09/28/2017 1145   ALT 27 09/28/2017 1145   ALKPHOS 106 09/28/2017 1145   BILITOT 0.4 09/28/2017 1145      Component Value Date/Time   TSH 0.471 09/28/2017 1145   TSH 0.499 02/15/2008 2052   TSH 0.106 (L) 01/09/2008 2228    ASSESSMENT AND PLAN: Type 2 diabetes mellitus without complication, with long-term current use of insulin (HCC)  Essential hypertension  Class 2 severe obesity with serious comorbidity and body mass index (BMI) of 39.0 to 39.9 in adult, unspecified obesity type ([place])  PLAN:  Diabetes II Max [name] has been given extensive diabetes education by myself today including ideal fasting and post-prandial blood glucose readings, individual ideal Hgb A1c goals and hypoglycemia prevention. We discussed the importance of good blood sugar control to decrease the likelihood of diabetic complications such as nephropathy,  neuropathy, limb loss, blindness, coronary artery disease, and death. We discussed the importance of intensive lifestyle modification including diet, exercise and weight loss as the first line treatment for diabetes. Max [name] agrees to continue his diabetes medications and he is to keep a log of the amount of insulin given at each check to be able to come up with a better dosing regimen. Max [name] agrees to follow up with our clinic in 2 weeks with Max [name], our registered dietitian and in 4 weeks with myself.   Hypertension We discussed sodium  restriction, working on healthy weight loss, and a regular exercise program as the means to achieve improved blood pressure control. Max [name] agreed with this plan and agreed to follow up as directed. We will continue to monitor his blood pressure as well as his progress with the above lifestyle modifications. Max [name] agrees to continue his medications and will watch for signs of hypotension as he continues his lifestyle modifications. Max [name] agrees to follow up with our clinic in 2 weeks with Max [name], our registered dietitian and in 4 weeks with myself.  I spent > than 50% of the 15 minute visit on counseling as documented in the note.  Obesity Max [name] is currently in the action stage of change. As such, his goal is to continue with weight loss efforts He has agreed to keep a food journal with 1500-1600 calories and 95 grams of protein daily or follow the Category 3 plan Max [name] has been instructed to work up to a goal of 150 minutes of combined cardio and strengthening exercise per week for weight loss and overall health benefits. We discussed the following Behavioral Modification Strategies today: increasing lean protein intake and keep a strict food journal   Max [name] has agreed to follow up with our clinic in 2 weeks with Max [name], our registered dietitian and in 4 weeks with myself. He was informed of the importance of frequent follow up visits to maximize his success with intensive lifestyle modifications for his multiple health conditions.   OBESITY BEHAVIORAL INTERVENTION VISIT  Today's visit was # 5   Starting weight: 273 lbs Starting date: 09/28/17 Today's weight : 276 lbs Today's date: 12/05/2017 Total lbs lost to date: 0    ASK: We discussed the diagnosis of obesity with Max [name] today and Max [name] agreed to give [name] permission to discuss obesity behavioral modification therapy today.  ASSESS: Max [name] has the diagnosis of obesity and his BMI today is 39.6 Max [name] is in  the action stage of change   ADVISE: [place] was educated on the multiple health risks of obesity as well as the benefit of weight loss to improve his health. He was advised of the need for long term treatment and the importance of lifestyle modifications to improve his current health and to decrease his risk of future health problems.  AGREE: Multiple dietary modification options and treatment options were discussed and  Max [name] agreed to follow the recommendations documented in the above note.  ARRANGE: Max [name] was educated on the importance of frequent visits to treat obesity as outlined per CMS and USPSTF guidelines and agreed to schedule his next follow up appointment today.  I, Trixie Dredge, am acting as transcriptionist for Ilene Qua, MD  I have reviewed the above documentation for accuracy and completeness, and I agree with the above. - Ilene Qua, MD

## 2017-12-12 DIAGNOSIS — E1165 Type 2 diabetes mellitus with hyperglycemia: Secondary | ICD-10-CM | POA: Diagnosis not present

## 2017-12-12 DIAGNOSIS — Z794 Long term (current) use of insulin: Secondary | ICD-10-CM | POA: Diagnosis not present

## 2017-12-19 ENCOUNTER — Ambulatory Visit (INDEPENDENT_AMBULATORY_CARE_PROVIDER_SITE_OTHER): Payer: Medicare Other | Admitting: Dietician

## 2017-12-19 ENCOUNTER — Encounter (INDEPENDENT_AMBULATORY_CARE_PROVIDER_SITE_OTHER): Payer: Self-pay | Admitting: Dietician

## 2017-12-19 VITALS — Ht 70.0 in | Wt 280.0 lb

## 2017-12-19 DIAGNOSIS — Z6841 Body Mass Index (BMI) 40.0 and over, adult: Secondary | ICD-10-CM | POA: Diagnosis not present

## 2017-12-19 DIAGNOSIS — Z9189 Other specified personal risk factors, not elsewhere classified: Secondary | ICD-10-CM

## 2017-12-19 NOTE — Progress Notes (Signed)
Office: [phone]  /  Fax: [phone]     Max [name] has a diagnosis of diabetes type II putting him at higher than average risk for diabetic complications such as cardiovascular disease.  Max [name] states BGs range between 75 and 321 and denies any hypoglycemic episodes. His blood sugars were reviewed on from is meter readings and are much improved with current nutrition plan of care. His fasting glucose range between 75-150's and post prandial generally in the 100's to a few in the 200's.   Hemoglobin A1C Latest Ref Rng & Units 09/28/2017  HGBA1C 4.8 - 5.6 % 8.3(H)  Some recent data might be hidden    He has been working on intensive lifestyle modifications including diet, exercise, and weight loss to help control his blood glucose levels. He is here today for diabetes education which includes his obesity treatment plan. His wife is also present for education.   His medications were reviewed. He reports he is currently taking 70/30 insulin before meals tid. He states he is taking 45 u before breakfast, 30 u before lunch and 45 units before dinner. This is a different regimen that is listed on his chart. He states he is taking this regimen due to the high cost of the insulin that was previously prescribed. It was recommended he follow up with the prescribing physician for his insulin regimen.   Max [name]'s weight today is 280 lbs a 4 lb weight gain since his last visit. His weight has not decreased however with the significant change in blood sugars suspect he was dehydrated at his initial visit. He states his is following his Category 3 meal plan approximately 80% of the time. He has made significant changes to his eating habits.   Patient was educated about food nutrients ie protein, fats, simple and complex carbohydrates and how these affect his blood sugars and his weight. Focus on portion control,  avoiding simple carbohydrates and lower fat foods for ongoing wt loss efforts and glucose  management. Additional snack and meal options were recommended to better assist his weight loss efforts.   Max [name] is on the following meal plan category 3 His meal plan was individualized for maximum benefit.  Also discussed at length the following behavioral modifications to help maximize success: increasing lean protein intake, especially at his dinner meal to decrease hunger and snacking after dinner,  increase water intake,  ways to avoid night time snacking, better snacking options.   Max [name] has been instructed to work up to a goal of 150 minutes of combined cardio and strengthening exercise per week for weight loss and overall health benefits, however he states he has significant back pain which is inhibiting his exercise at this time. Written information was provided on lean protein options, snacking options.    OBESITY BEHAVIORAL INTERVENTION VISIT  Today's visit was # 6   Starting weight: 273 lbs Starting date: 09/28/17 Today's weight : Weight: 280 lb (127 kg)  Today's date: 12/19/2017 Total lbs lost to date: 0 At least 15 minutes were spent on discussing the following behavioral intervention visit.   ASK: We discussed the diagnosis of obesity with Max [name] today and Max [name] agreed to give [name] permission to discuss obesity behavioral modification therapy today.  ASSESS: Max [name] has the diagnosis of obesity and his BMI today is 39 Max [name] is in the action stage of change   ADVISE: Max [name] was educated on the multiple health risks of obesity as well as the benefit of weight loss to improve his  health. He was advised of the need for long term treatment and the importance of lifestyle modifications to improve his current health and to decrease his risk of future health problems.  AGREE: Multiple dietary modification options and treatment options were discussed and  Max [name] agreed to follow the recommendations documented in the above note.  ARRANGE: Max [name] was educated  on the importance of frequent visits to treat obesity as outlined per CMS and USPSTF guidelines and agreed to schedule his next follow up appointment today.

## 2017-12-21 DIAGNOSIS — E1165 Type 2 diabetes mellitus with hyperglycemia: Secondary | ICD-10-CM | POA: Diagnosis not present

## 2017-12-21 DIAGNOSIS — Z794 Long term (current) use of insulin: Secondary | ICD-10-CM | POA: Diagnosis not present

## 2017-12-22 DIAGNOSIS — Z4789 Encounter for other orthopedic aftercare: Secondary | ICD-10-CM | POA: Diagnosis not present

## 2017-12-22 DIAGNOSIS — M48061 Spinal stenosis, lumbar region without neurogenic claudication: Secondary | ICD-10-CM | POA: Diagnosis not present

## 2017-12-22 DIAGNOSIS — M5136 Other intervertebral disc degeneration, lumbar region: Secondary | ICD-10-CM | POA: Diagnosis not present

## 2017-12-22 DIAGNOSIS — Z981 Arthrodesis status: Secondary | ICD-10-CM | POA: Diagnosis not present

## 2017-12-22 DIAGNOSIS — M4316 Spondylolisthesis, lumbar region: Secondary | ICD-10-CM | POA: Diagnosis not present

## 2017-12-22 DIAGNOSIS — M4807 Spinal stenosis, lumbosacral region: Secondary | ICD-10-CM | POA: Diagnosis not present

## 2018-01-02 ENCOUNTER — Encounter (INDEPENDENT_AMBULATORY_CARE_PROVIDER_SITE_OTHER): Payer: Self-pay | Admitting: Family Medicine

## 2018-01-02 ENCOUNTER — Ambulatory Visit (INDEPENDENT_AMBULATORY_CARE_PROVIDER_SITE_OTHER): Payer: Medicare Other | Admitting: Family Medicine

## 2018-01-02 VITALS — BP 146/80 | HR 86 | Temp 98.1°F | Ht 70.0 in | Wt 280.0 lb

## 2018-01-02 DIAGNOSIS — Z794 Long term (current) use of insulin: Secondary | ICD-10-CM

## 2018-01-02 DIAGNOSIS — E119 Type 2 diabetes mellitus without complications: Secondary | ICD-10-CM | POA: Diagnosis not present

## 2018-01-02 DIAGNOSIS — Z6841 Body Mass Index (BMI) 40.0 and over, adult: Secondary | ICD-10-CM

## 2018-01-02 DIAGNOSIS — I10 Essential (primary) hypertension: Secondary | ICD-10-CM | POA: Diagnosis not present

## 2018-01-03 NOTE — Progress Notes (Signed)
Office: [phone]  /  Fax: [phone]   HPI:   Chief Complaint: OBESITY Max [name] is here to discuss his progress with his obesity treatment plan. He is on the  follow the Category 3 plan and is following his eating plan approximately 70 % of the time. He states he is exercising 0 minutes 0 times per week. [name] saw the dietician last visit. He has had a few doctor appointments and has to eat on the road. He occasionally waking up for greek yogurt and mixed with coffee creamer.   His weight is 280 lb (127 kg) today and has not lost weight since his last visit. He has lost 0 lbs since starting treatment with [name].  Diabetes II Max [name] has a diagnosis of diabetes type II. Max [name] has a continuous glucose meter now and states BGs range between 69 and 200. He is currently doing 40-45 units of 70/30 and occasionally doing 60-65 units in the mornings. He denies any hypoglycemic episodes. Last A1c was 8.3.  He has been working on intensive lifestyle modifications including diet, exercise, and weight loss to help control his blood glucose levels.  Hypertension Max [name] is a 72 y.o. male with hypertension.  [name] denies chest pain/pressure, headache, or shortness of breath on exertion. He is working weight loss to help control his blood pressure with the goal of decreasing his risk of heart attack and stroke. Raymonds blood pressure is not currently controlled it is slightly elevated.    ALLERGIES: Allergies  Allergen Reactions  . Butane Anxiety and Other (See Comments)    Hallucinations REACTIONS ARE SIDE EFFECTS   . Gabapentin Other (See Comments)    Causes agression  . Hydromorphone Swelling    SWELLING REACTION UNSPECIFIED   . Tizanidine Swelling    SWELLING REACTION UNSPECIFIED   . Topamax [Topiramate] Other (See Comments)    Hallucinations   . Buprenorphine Other (See Comments)    UNSPECIFIED REACTION   . Pork-Derived Products     Pt. Refuses pork derived  products  . Latex Rash    Localized rash    MEDICATIONS: Current Outpatient Medications on File Prior to Visit  Medication Sig Dispense Refill  . alfuzosin (UROXATRAL) 10 MG 24 hr tablet Take 10 mg by mouth daily with breakfast.    . amLODipine (NORVASC) 10 MG tablet Take 10 mg by mouth daily.     . Blood Glucose Monitoring Suppl (ONETOUCH VERIO) w/Device KIT 1 kit by Does not apply route daily at 12 noon. Continuous Glucose Monitoring Kit 1 kit 0  . Cholecalciferol (D3-1000 PO) Take 1 capsule by mouth daily.    . DULoxetine (CYMBALTA) 60 MG capsule Take 60 mg by mouth 2 (two) times daily.    . ferrous sulfate 325 (65 FE) MG EC tablet Take 325 mg by mouth daily with breakfast.    . Flaxseed Oil OIL 750 mg daily.    Marland Kitchen glucose blood (ACCU-CHEK AVIVA) test strip Use as instructed 100 each 0  . hydrochlorothiazide (HYDRODIURIL) 25 MG tablet Take 25 mg by mouth daily.    . hydrOXYzine (VISTARIL) 25 MG capsule   1  . Ibuprofen (ADVIL) 200 MG CAPS Take 2 capsules by mouth daily.    . insulin NPH Human (NOVOLIN N) 100 UNIT/ML injection Inject 0.05 mLs (5 Units total) into the skin at bedtime. 10 mL 11  . insulin regular (NOVOLIN R RELION) 250 units/2.16m (100 units/mL) injection INJECT 15 UNITS SUBCUTANEOUSLY THREE TIMES DAILY BEFORE MEAL(S)  20 mL 0  . Lancets (ACCU-CHEK SAFE-T PRO) lancets Use as instructed 100 each 0  . losartan (COZAAR) 100 MG tablet Take 100 mg by mouth daily.    . Magnesium 400 MG TABS Take 1 tablet by mouth daily.    . naproxen sodium (ANAPROX) 220 MG tablet Take 220 mg by mouth daily as needed (for pain).     Marland Kitchen oxyCODONE (ROXICODONE) 15 MG immediate release tablet Take 10 mg by mouth 3 (three) times daily.     . Potassium 99 MG TABS Take 1 tablet by mouth daily.    . pravastatin (PRAVACHOL) 20 MG tablet Take 40 mg by mouth at bedtime.     . Probiotic Product (PROBIOTIC-10 PO) Take 1 capsule by mouth daily.    . tamsulosin (FLOMAX) 0.4 MG CAPS capsule Take 0.4 mg by mouth  daily.     Marland Kitchen topiramate (TOPAMAX) 25 MG tablet Take 25 mg by mouth 2 (two) times daily.    . [name] 1.5 ID/7.8EU SOPN     . vitamin B-12 (CYANOCOBALAMIN) 1000 MCG tablet Take 1,000 mcg by mouth daily.     No current facility-administered medications on file prior to visit.     PAST MEDICAL HISTORY: Past Medical History:  Diagnosis Date  . ADD (attention deficit disorder)   . Anxiety   . Back pain   . Bipolar 1 disorder ([place])   . Chronic pain   . Constipation   . Decreased hearing   . Depression   . Diabetes mellitus without complication ([place])    Type II  . Dry skin   . DVT (deep venous thrombosis) ([place]) 2004   leg (not sure which leg)  . Dyslipidemia   . Dyspnea    with exertion  . Ear drainage   . Excessive hunger   . Excessive thirst   . Eye pain   . Fatigue   . Frequent urination   . Hard of hearing   . Hyperlipidemia   . Hypertension   . Itching   . Joint pain   . Lactose intolerance   . Leg cramps   . Mouth sores   . Nasal and sinus discharge   . Nausea   . Numbness and tingling    bilateral  . OSA (obstructive sleep apnea)    wears CPAP  . Shortness of breath   . Shortness of breath on exertion   . Sinus congestion   . Stiff neck   . Stress   . Swallowing difficulty   . Swelling of extremity   . Tinnitus   . Trouble in sleeping   . Urinary frequency   . Vision changes   . Weakness   . Wears glasses     PAST SURGICAL HISTORY: Past Surgical History:  Procedure Laterality Date  . arm surgery Right   . BACK SURGERY  2004  . COLONOSCOPY    . ESOPHAGOGASTRODUODENOSCOPY    . EYE SURGERY Right    cataract  . SUBMANDIBULAR GLAND EXCISION Right 03/10/2016   Procedure: INTRAORAL  SUBMANDIBULAR  STONE REMOVAL;  Surgeon: Izora Gala, MD;  Location: [facility];  Service: ENT;  Laterality: Right;    SOCIAL HISTORY: Social History   Tobacco Use  . Smoking status: Never Smoker  . Smokeless tobacco: Never Used  Substance Use Topics  . Alcohol use:  No  . Drug use: No    FAMILY HISTORY: Family History  Problem Relation Age of Onset  . Diabetes Mother   .  Hypertension Mother   . Hyperlipidemia Mother   . Kidney disease Mother   . Cancer Mother   . Diabetes Brother     ROS: Review of Systems  Constitutional: Negative for weight loss.  Respiratory: Negative for shortness of breath.   Cardiovascular: Negative for chest pain.       Negative for chest pressure   Neurological: Negative for headaches.  Endo/Heme/Allergies:       Negative for hypoglycemia    PHYSICAL EXAM: Blood pressure (!) 146/80, pulse 86, temperature 98.1 F (36.7 C), temperature source Oral, height '5\' 10"'$  (1.778 m), weight 280 lb (127 kg), SpO2 95 %. Body mass index is 40.18 kg/m. Physical Exam  Constitutional: He is oriented to person, place, and time. He appears well-developed and well-nourished.  HENT:  Head: Normocephalic.  Neck: Normal range of motion.  Cardiovascular: Normal rate.  Pulmonary/Chest: Effort normal.  Musculoskeletal: Normal range of motion.  Neurological: He is alert and oriented to person, place, and time.  Skin: Skin is warm and dry.  Psychiatric: He has a normal mood and affect. His behavior is normal.  Vitals reviewed.   RECENT LABS AND TESTS: BMET    Component Value Date/Time   NA 139 09/28/2017 1145   K 4.0 09/28/2017 1145   CL 100 09/28/2017 1145   CO2 24 09/28/2017 1145   GLUCOSE 127 (H) 09/28/2017 1145   GLUCOSE 208 (H) 03/09/2016 1424   BUN 15 09/28/2017 1145   CREATININE 0.91 09/28/2017 1145   CALCIUM 9.4 09/28/2017 1145   GFRNONAA 86 09/28/2017 1145   GFRAA 99 09/28/2017 1145   Lab Results  Component Value Date   HGBA1C 8.3 (H) 09/28/2017   HGBA1C 7.2 (H) 03/09/2016   HGBA1C 7.9 08/12/2015   No results found for: INSULIN CBC    Component Value Date/Time   WBC 10.8 09/28/2017 1145   WBC 11.6 (H) 03/09/2016 1424   RBC 5.32 09/28/2017 1145   RBC 5.17 03/09/2016 1424   HGB 13.8 09/28/2017 1145    HCT 42.3 09/28/2017 1145   PLT 291 03/09/2016 1424   MCV 80 09/28/2017 1145   MCH 25.9 (L) 09/28/2017 1145   MCH 26.7 03/09/2016 1424   MCHC 32.6 09/28/2017 1145   MCHC 33.3 03/09/2016 1424   RDW 14.6 09/28/2017 1145   LYMPHSABS 1.7 09/28/2017 1145   EOSABS 0.1 09/28/2017 1145   BASOSABS 0.0 09/28/2017 1145   Iron/TIBC/Ferritin/ %Sat No results found for: IRON, TIBC, FERRITIN, IRONPCTSAT Lipid Panel     Component Value Date/Time   CHOL 206 (H) 09/28/2017 1145   TRIG 60 09/28/2017 1145   HDL 70 09/28/2017 1145   CHOLHDL 3.8 Ratio 01/09/2008 2228   VLDL 29 01/09/2008 2228   LDLCALC 124 (H) 09/28/2017 1145   Hepatic Function Panel     Component Value Date/Time   PROT 6.7 09/28/2017 1145   ALBUMIN 4.4 09/28/2017 1145   AST 22 09/28/2017 1145   ALT 27 09/28/2017 1145   ALKPHOS 106 09/28/2017 1145   BILITOT 0.4 09/28/2017 1145      Component Value Date/Time   TSH 0.471 09/28/2017 1145   TSH 0.499 02/15/2008 2052   TSH 0.106 (L) 01/09/2008 2228    ASSESSMENT AND PLAN: Type 2 diabetes mellitus without complication, with long-term current use of insulin (HCC)  Essential hypertension  Class 3 severe obesity with serious comorbidity and body mass index (BMI) of 40.0 to 44.9 in adult, unspecified obesity type ([place])  PLAN: Diabetes II Camden has been given  extensive diabetes education by myself today including ideal fasting and post-prandial blood glucose readings, individual ideal HgA1c goals  and hypoglycemia prevention. We discussed the importance of good blood sugar control to decrease the likelihood of diabetic complications such as nephropathy, neuropathy, limb loss, blindness, coronary artery disease, and death. We discussed the importance of intensive lifestyle modification including diet, exercise and weight loss as the first line treatment for diabetes. Discussed making logbook of insulin dosages associated with BGs, he agrees. Deaken agrees to continue his diabetes  medications and will follow up at the agreed upon time.  Hypertension We discussed sodium restriction, working on healthy weight loss, and a regular exercise program as the means to achieve improved blood pressure control. Zaveon agreed with this plan and agreed to follow up as directed. We will continue to monitor his blood pressure as well as his progress with the above lifestyle modifications. He will continue his medications as prescribed and will watch for signs of hypotension as he continues his lifestyle modifications.  I spent > than 50% of the 15 minute visit on counseling as documented in the note.  Obesity Ziyad is currently in the action stage of change. As such, his goal is to continue with weight loss efforts He has agreed to follow the Category 3 plan and journaling 400-500 calories with 35+g of protein for lunch.  Cyruss has been instructed to work up to a goal of 150 minutes of combined cardio and strengthening exercise per week for weight loss and overall health benefits. We discussed the following Behavioral Modification Strategies today: increasing lean protein intake, increasing vegetables, planning for success, and work on meal planning and easy cooking plans   Husain has agreed to follow up with our clinic in 2 weeks. He was informed of the importance of frequent follow up visits to maximize his success with intensive lifestyle modifications for his multiple health conditions.   OBESITY BEHAVIORAL INTERVENTION VISIT  Today's visit was # 6   Starting weight: 273 lb Starting date: 09/28/17 Today's weight : 280 lb Today's date: 01/02/18 Total lbs lost to date: 0    ASK: We discussed the diagnosis of obesity with [name] today and Tremayne agreed to give [name] permission to discuss obesity behavioral modification therapy today.  ASSESS: Zacharius has the diagnosis of obesity and his BMI today is 40.18 Abdulai is in the action stage of change    ADVISE: Tallin was educated on the multiple health risks of obesity as well as the benefit of weight loss to improve his health. He was advised of the need for long term treatment and the importance of lifestyle modifications to improve his current health and to decrease his risk of future health problems.  AGREE: Multiple dietary modification options and treatment options were discussed and  Malikai agreed to follow the recommendations documented in the above note.  ARRANGE: Jonaven was educated on the importance of frequent visits to treat obesity as outlined per CMS and USPSTF guidelines and agreed to schedule his next follow up appointment today.  I, Renee Ramus, am acting as transcriptionist for Ilene Qua, MD   I have reviewed the above documentation for accuracy and completeness, and I agree with the above. - Ilene Qua, MD

## 2018-01-17 ENCOUNTER — Ambulatory Visit (INDEPENDENT_AMBULATORY_CARE_PROVIDER_SITE_OTHER): Payer: Medicare Other | Admitting: Family Medicine

## 2018-01-17 VITALS — BP 151/79 | HR 94 | Temp 98.6°F | Ht 70.0 in | Wt 284.0 lb

## 2018-01-17 DIAGNOSIS — Z794 Long term (current) use of insulin: Secondary | ICD-10-CM | POA: Diagnosis not present

## 2018-01-17 DIAGNOSIS — E119 Type 2 diabetes mellitus without complications: Secondary | ICD-10-CM | POA: Diagnosis not present

## 2018-01-17 DIAGNOSIS — I10 Essential (primary) hypertension: Secondary | ICD-10-CM

## 2018-01-17 DIAGNOSIS — Z6841 Body Mass Index (BMI) 40.0 and over, adult: Secondary | ICD-10-CM

## 2018-01-23 NOTE — Progress Notes (Signed)
Office: [phone]  /  Fax: [phone]   HPI:   Chief Complaint: OBESITY Max [name] is here to discuss his progress with his obesity treatment plan. He is on the keep [name] food journal with 400 to 500 calories and 35+ grams of protein at lunch daily and the Category 3 plan and is following his eating plan approximately 95 % of the time. He states he is exercising 0 minutes 0 times per week. Max [name] stopped taking insulin at night secondary to hypoglycemia. He is following the plan most of the time, except when  traveling to his doctor appointment. Max [name] doesn't think he is getting all of his protein in at dinner. His weight is 284 lb (128.8 kg) today and has had [name] weight gain of 4 pounds over [name] period of 2 weeks since his last visit. He has gained 11 lbs since starting treatment with [name].  Diabetes II Max [name] has [name] diagnosis of diabetes type II. Max [name] states fasting BGs range between 50 and 120. Max [name] then stopped taking insulin in the evening secondary to hypoglycemia. Last A1c was at 8.3. Max [name] is taking approximately 30 units of 70/30 insulin at breakfast and lunch. He has been working on intensive lifestyle modifications including diet, exercise, and weight loss to help control his blood glucose levels.  Hypertension Max [name] is [name] 71 y.o. male with hypertension. His blood pressure is elevated today at 151/79. Max [name] denies chest pain, chest pressure or headache. He is working weight loss to help control his blood pressure with the goal of decreasing his risk of heart attack and stroke. Max [name] blood pressure is not currently controlled.  ALLERGIES: Allergies  Allergen Reactions  . Butane Anxiety and Other (See Comments)    Hallucinations REACTIONS ARE SIDE EFFECTS   . Gabapentin Other (See Comments)    Causes agression  . Hydromorphone Swelling    SWELLING REACTION UNSPECIFIED   . Tizanidine Swelling    SWELLING REACTION UNSPECIFIED   . Topamax  [Topiramate] Other (See Comments)    Hallucinations   . Buprenorphine Other (See Comments)    UNSPECIFIED REACTION   . Pork-Derived Products     Pt. Refuses pork derived products  . Latex Rash    Localized rash    MEDICATIONS: Current Outpatient Medications on File Prior to Visit  Medication Sig Dispense Refill  . alfuzosin (UROXATRAL) 10 MG 24 hr tablet Take 10 mg by mouth daily with breakfast.    . amLODipine (NORVASC) 10 MG tablet Take 10 mg by mouth daily.     . Blood Glucose Monitoring Suppl (ONETOUCH VERIO) w/Device KIT 1 kit by Does not apply route daily at 12 noon. Continuous Glucose Monitoring Kit 1 kit 0  . Cholecalciferol (D3-1000 PO) Take 1 capsule by mouth daily.    . DULoxetine (CYMBALTA) 60 MG capsule Take 60 mg by mouth 2 (two) times daily.    . ferrous sulfate 325 (65 FE) MG EC tablet Take 325 mg by mouth daily with breakfast.    . Flaxseed Oil OIL 750 mg daily.    Max [name] Kitchen glucose blood (ACCU-CHEK AVIVA) test strip Use as instructed 100 each 0  . hydrochlorothiazide (HYDRODIURIL) 25 MG tablet Take 25 mg by mouth daily.    . hydrOXYzine (VISTARIL) 25 MG capsule   1  . Ibuprofen (ADVIL) 200 MG CAPS Take 2 capsules by mouth daily.    . insulin NPH Human (NOVOLIN N) 100 UNIT/ML injection Inject 0.05 mLs (5 Units total) into the skin  at bedtime. 10 mL 11  . insulin regular (NOVOLIN R RELION) 250 units/2.67m (100 units/mL) injection INJECT 15 UNITS SUBCUTANEOUSLY THREE TIMES DAILY BEFORE MEAL(S) 20 mL 0  . Lancets (ACCU-CHEK SAFE-T PRO) lancets Use as instructed 100 each 0  . losartan (COZAAR) 100 MG tablet Take 100 mg by mouth daily.    . Magnesium 400 MG TABS Take 1 tablet by mouth daily.    . naproxen sodium (ANAPROX) 220 MG tablet Take 220 mg by mouth daily as needed (for pain).     .Max [name] KitchenoxyCODONE (ROXICODONE) 15 MG immediate release tablet Take 10 mg by mouth 3 (three) times daily.     . Potassium 99 MG TABS Take 1 tablet by mouth daily.    . pravastatin (PRAVACHOL) 20 MG  tablet Take 40 mg by mouth at bedtime.     . Probiotic Product (PROBIOTIC-10 PO) Take 1 capsule by mouth daily.    . tamsulosin (FLOMAX) 0.4 MG CAPS capsule Take 0.4 mg by mouth daily.     .Max [name] Kitchentopiramate (TOPAMAX) 25 MG tablet Take 25 mg by mouth 2 (two) times daily.    . [name] 1.5 MPF/7.9KWSOPN     . vitamin B-12 (CYANOCOBALAMIN) 1000 MCG tablet Take 1,000 mcg by mouth daily.     No current facility-administered medications on file prior to visit.     PAST MEDICAL HISTORY: Past Medical History:  Diagnosis Date  . ADD (attention deficit disorder)   . Anxiety   . Back pain   . [place] 1 [place]   . Chronic pain   . Constipation   . Decreased hearing   . Depression   . Diabetes [place] without complication ([place]    Type II  . Dry skin   . [place]   leg (not sure which leg)  . Dyslipidemia   . Dyspnea    with exertion  . Ear drainage   . Excessive hunger   . Excessive thirst   . Eye pain   . Fatigue   . Frequent urination   . Hard of hearing   . Hyperlipidemia   . Hypertension   . Itching   . Joint pain   . Lactose intolerance   . Leg cramps   . Mouth sores   . Nasal and sinus discharge   . Nausea   . Numbness and tingling    bilateral  . OSA (obstructive sleep apnea)    wears CPAP  . Shortness of breath   . Shortness of breath on exertion   . Sinus congestion   . Stiff neck   . Stress   . Swallowing difficulty   . Swelling of extremity   . Tinnitus   . Trouble in sleeping   . Urinary frequency   . Vision changes   . Weakness   . Wears glasses     PAST SURGICAL HISTORY: Past Surgical History:  Procedure Laterality Date  . arm surgery Right   . BACK SURGERY  2004  . COLONOSCOPY    . ESOPHAGOGASTRODUODENOSCOPY    . EYE SURGERY Right    cataract  . SUBMANDIBULAR GLAND EXCISION Right 03/10/2016   Procedure: INTRAORAL  SUBMANDIBULAR  STONE REMOVAL;  Surgeon: JIzora Gala MD;  [facility]  Service: ENT;   Laterality: Right;    SOCIAL HISTORY: Social History   Tobacco Use  . Smoking status: Never Smoker  . Smokeless tobacco: Never Used  Substance Use Topics  . Alcohol  use: No  . Drug use: No    FAMILY HISTORY: Family History  Problem Relation Age of Onset  . Diabetes Mother   . Hypertension Mother   . Hyperlipidemia Mother   . Kidney disease Mother   . Cancer Mother   . Diabetes Brother     ROS: Review of Systems  Constitutional: Negative for weight loss.  Cardiovascular: Negative for chest pain.       Negative for chest pressure  Neurological: Negative for headaches.  Endo/Heme/Allergies:       Positive for hypoglycemia    PHYSICAL EXAM: Blood pressure (!) 151/79, pulse 94, temperature 98.6 F (37 C), temperature source Oral, height _0  (1.778 m), weight 284 lb (128.8 kg), SpO2 98 %. Body mass index is 40.75 kg/m. Physical Exam  RECENT LABS AND TESTS: BMET    Component Value Date/Time   NA 139 09/28/2017 1145   K 4.0 09/28/2017 1145   CL 100 09/28/2017 1145   CO2 24 09/28/2017 1145   GLUCOSE 127 (H) 09/28/2017 1145   GLUCOSE 208 (H) 03/09/2016 1424   BUN 15 09/28/2017 1145   CREATININE 0.91 09/28/2017 1145   CALCIUM 9.4 09/28/2017 1145   GFRNONAA 86 09/28/2017 1145   GFRAA 99 09/28/2017 1145   Lab Results  Component Value Date   HGBA1C 8.3 (H) 09/28/2017   HGBA1C 7.2 (H) 03/09/2016   HGBA1C 7.9 08/12/2015   No results found for: INSULIN CBC    Component Value Date/Time   WBC 10.8 09/28/2017 1145   WBC 11.6 (H) 03/09/2016 1424   RBC 5.32 09/28/2017 1145   RBC 5.17 03/09/2016 1424   HGB 13.8 09/28/2017 1145   HCT 42.3 09/28/2017 1145   PLT 291 03/09/2016 1424   MCV 80 09/28/2017 1145   MCH 25.9 (L) 09/28/2017 1145   MCH 26.7 03/09/2016 1424   MCHC 32.6 09/28/2017 1145   MCHC 33.3 03/09/2016 1424   RDW 14.6 09/28/2017 1145   LYMPHSABS 1.7 09/28/2017 1145   EOSABS 0.1 09/28/2017 1145   BASOSABS 0.0 09/28/2017 1145    Iron/TIBC/Ferritin/ %Sat No results found for: IRON, TIBC, FERRITIN, IRONPCTSAT Lipid Panel     Component Value Date/Time   CHOL 206 (H) 09/28/2017 1145   TRIG 60 09/28/2017 1145   HDL 70 09/28/2017 1145   CHOLHDL 3.8 Ratio 01/09/2008 2228   VLDL 29 01/09/2008 2228   LDLCALC 124 (H) 09/28/2017 1145   Hepatic Function Panel     Component Value Date/Time   PROT 6.7 09/28/2017 1145   ALBUMIN 4.4 09/28/2017 1145   AST 22 09/28/2017 1145   ALT 27 09/28/2017 1145   ALKPHOS 106 09/28/2017 1145   BILITOT 0.4 09/28/2017 1145      Component Value Date/Time   TSH 0.471 09/28/2017 1145   TSH 0.499 02/15/2008 2052   TSH 0.106 (L) 01/09/2008 2228   Results for Max [name], Max [name] (MRN [id number]) as of 01/23/2018 12:35  Ref. Range 09/28/2017 11:45  Vitamin D, 25-Hydroxy Latest Ref Range: 30.0 - 100.0 ng/mL 50.1   ASSESSMENT AND PLAN: Type 2 diabetes mellitus without complication, with long-term current use of insulin (HCC)  Essential hypertension  Class 3 severe obesity with serious comorbidity and body mass index (BMI) of 40.0 to 44.9 in adult, unspecified obesity type ([place])  PLAN:  Diabetes II Max [name] has been given extensive diabetes education by myself today including ideal fasting and post-prandial blood glucose readings, individual ideal Hgb A1c goals and hypoglycemia prevention. We discussed the importance of good  blood sugar control to decrease the likelihood of diabetic complications such as nephropathy, neuropathy, limb loss, blindness, coronary artery disease, and death. We discussed the importance of intensive lifestyle modification including diet, exercise and weight loss as the first line treatment for diabetes. Azaan agrees to continue monitoring his blood sugar and we discussed hypoglycemic protocol again. Max [name] will continue his diabetes medications and will follow up at the agreed upon time.  Hypertension We discussed sodium restriction, working on healthy weight  loss, and [name] regular exercise program as the means to achieve improved blood pressure control. Max [name] agreed with this plan and agreed to follow up as directed. We will continue to monitor his blood pressure as well as his progress with the above lifestyle modifications. He will continue his medications as prescribed and will watch for signs of hypotension as he continues his lifestyle modifications.  I spent > than 50% of the 15 minute visit on counseling as documented in the note.  Obesity Max [name] is currently in the action stage of change. As such, his goal is to continue with weight loss efforts He has agreed to follow the Category 3 plan Max [name] has been instructed to work up to [name] goal of 150 minutes of combined cardio and strengthening exercise per week for weight loss and overall health benefits. We discussed the following Behavioral Modification Strategies today: planning for success, increasing lean protein intake, increasing vegetables and work on meal planning and easy cooking plans  Max [name] has agreed to follow up with our clinic in 2 weeks. He was informed of the importance of frequent follow up visits to maximize his success with intensive lifestyle modifications for his multiple health conditions.   OBESITY BEHAVIORAL INTERVENTION VISIT  Today's visit was # 2   Starting weight: 273 lbs Starting date: 09/28/17 Today's weight : 284 lbs  Today's date: 01/17/2018 Total lbs lost to date: 0   ASK: We discussed the diagnosis of obesity with Max [name] today and Lauri agreed to give [name] permission to discuss obesity behavioral modification therapy today.  ASSESS: Max [name] has the diagnosis of obesity and his BMI today is 40.75 Max [name] is in the action stage of change   ADVISE: Sameer was educated on the multiple health risks of obesity as well as the benefit of weight loss to improve his health. He was advised of the need for long term treatment and the importance of  lifestyle modifications to improve his current health and to decrease his risk of future health problems.  AGREE: Multiple dietary modification options and treatment options were discussed and  Max [name] agreed to follow the recommendations documented in the above note.  ARRANGE: Max [name] was educated on the importance of frequent visits to treat obesity as outlined per CMS and USPSTF guidelines and agreed to schedule his next follow up appointment today.  I, Doreene Nest, am acting as transcriptionist for Eber Jones, MD  I have reviewed the above documentation for accuracy and completeness, and I agree with the above. - Ilene Qua, MD

## 2018-01-30 ENCOUNTER — Ambulatory Visit (INDEPENDENT_AMBULATORY_CARE_PROVIDER_SITE_OTHER): Payer: Medicare Other | Admitting: Family Medicine

## 2018-01-30 VITALS — BP 168/71 | HR 99 | Temp 98.3°F | Ht 70.0 in | Wt 286.0 lb

## 2018-01-30 DIAGNOSIS — Z6841 Body Mass Index (BMI) 40.0 and over, adult: Secondary | ICD-10-CM

## 2018-01-30 DIAGNOSIS — E119 Type 2 diabetes mellitus without complications: Secondary | ICD-10-CM

## 2018-01-30 DIAGNOSIS — I10 Essential (primary) hypertension: Secondary | ICD-10-CM | POA: Diagnosis not present

## 2018-01-30 NOTE — Progress Notes (Signed)
Office: [phone]  /  Fax: [phone]   HPI:   Chief Complaint: OBESITY Max [name] is here to discuss his progress with his obesity treatment plan. He is on the Category 3 plan and is following his eating plan approximately 80 % of the time. He states he is exercising 30 minutes 3 times per week. Diquan is trying his best to follow the plan. He is doing all food at breakfast and most at lunch (missing fruit). He is doing protein shakes between lunch and dinner, doing 10 oz of meat at dinner, but using regular salad dressing, and drinking almond milk at each meal. He seems to be taking in approximately 800 calories a day of snack calories.  His weight is 286 lb (129.7 kg) today and has gained 2 pounds since his last visit. He has lost 0 lbs since starting treatment with [name].  Hypertension Max [name] is a 72 y.o. male with hypertension. Jia's blood pressure is uncontrolled today. He denies chest pain, chest pressure, or headaches. He is working weight loss to help control his blood pressure with the goal of decreasing his risk of heart attack and stroke.   Diabetes II Max [name] has a diagnosis of diabetes type II. Max [name] is taking bitter melon gourd. He states fasting BGs are ranging significantly from 50's to 200's. He has been decreasing insulin at night, doing 30 units at breakfast and 40 units at lunch, and none at dinner. Last A1c was 8.3. He has been working on intensive lifestyle modifications including diet, exercise, and weight loss to help control his blood glucose levels.  ALLERGIES: Allergies  Allergen Reactions  . Butane Anxiety and Other (See Comments)    Hallucinations REACTIONS ARE SIDE EFFECTS   . Gabapentin Other (See Comments)    Causes agression  . Hydromorphone Swelling    SWELLING REACTION UNSPECIFIED   . Tizanidine Swelling    SWELLING REACTION UNSPECIFIED   . Topamax [Topiramate] Other (See Comments)    Hallucinations   . Buprenorphine Other (See  Comments)    UNSPECIFIED REACTION   . Pork-Derived Products     Pt. Refuses pork derived products  . Latex Rash    Localized rash    MEDICATIONS: Current Outpatient Medications on File Prior to Visit  Medication Sig Dispense Refill  . alfuzosin (UROXATRAL) 10 MG 24 hr tablet Take 10 mg by mouth daily with breakfast.    . amLODipine (NORVASC) 10 MG tablet Take 10 mg by mouth daily.     . Blood Glucose Monitoring Suppl (ONETOUCH VERIO) w/Device KIT 1 kit by Does not apply route daily at 12 noon. Continuous Glucose Monitoring Kit 1 kit 0  . Cholecalciferol (D3-1000 PO) Take 1 capsule by mouth daily.    . DULoxetine (CYMBALTA) 60 MG capsule Take 60 mg by mouth 2 (two) times daily.    . ferrous sulfate 325 (65 FE) MG EC tablet Take 325 mg by mouth daily with breakfast.    . Flaxseed Oil OIL 750 mg daily.    Max Kitchen glucose blood (ACCU-CHEK AVIVA) test strip Use as instructed 100 each 0  . hydrochlorothiazide (HYDRODIURIL) 25 MG tablet Take 25 mg by mouth daily.    . hydrOXYzine (VISTARIL) 25 MG capsule   1  . Ibuprofen (ADVIL) 200 MG CAPS Take 2 capsules by mouth daily.    . insulin NPH Human (NOVOLIN N) 100 UNIT/ML injection Inject 0.05 mLs (5 Units total) into the skin at bedtime. 10 mL 11  . insulin regular (  NOVOLIN R RELION) 250 units/2.60m (100 units/mL) injection INJECT 15 UNITS SUBCUTANEOUSLY THREE TIMES DAILY BEFORE MEAL(S) 20 mL 0  . Lancets (ACCU-CHEK SAFE-T PRO) lancets Use as instructed 100 each 0  . losartan (COZAAR) 100 MG tablet Take 100 mg by mouth daily.    . Magnesium 400 MG TABS Take 1 tablet by mouth daily.    . naproxen sodium (ANAPROX) 220 MG tablet Take 220 mg by mouth daily as needed (for pain).     .Max KitchenoxyCODONE (ROXICODONE) 15 MG immediate release tablet Take 10 mg by mouth 3 (three) times daily.     . Potassium 99 MG TABS Take 1 tablet by mouth daily.    . pravastatin (PRAVACHOL) 20 MG tablet Take 40 mg by mouth at bedtime.     . Probiotic Product (PROBIOTIC-10 PO) Take  1 capsule by mouth daily.    . tamsulosin (FLOMAX) 0.4 MG CAPS capsule Take 0.4 mg by mouth daily.     .Max Kitchentopiramate (TOPAMAX) 25 MG tablet Take 25 mg by mouth 2 (two) times daily.    . [name] 1.5 MGE/3.6OQSOPN     . vitamin B-12 (CYANOCOBALAMIN) 1000 MCG tablet Take 1,000 mcg by mouth daily.     No current facility-administered medications on file prior to visit.     PAST MEDICAL HISTORY: Past Medical History:  Diagnosis Date  . ADD (attention deficit disorder)   . Anxiety   . Back pain   . Bipolar 1 disorder ([facility]   . Chronic pain   . Constipation   . Decreased hearing   . Depression   . Diabetes mellitus without complication ([facility]    Type II  . Dry skin   . [place]   leg (not sure which leg)  . Dyslipidemia   . Dyspnea    with exertion  . Ear drainage   . Excessive hunger   . Excessive thirst   . Eye pain   . Fatigue   . Frequent urination   . Hard of hearing   . Hyperlipidemia   . Hypertension   . Itching   . Joint pain   . Lactose intolerance   . Leg cramps   . Mouth sores   . Nasal and sinus discharge   . Nausea   . Numbness and tingling    bilateral  . OSA (obstructive sleep apnea)    wears CPAP  . Shortness of breath   . Shortness of breath on exertion   . Sinus congestion   . Stiff neck   . Stress   . Swallowing difficulty   . Swelling of extremity   . Tinnitus   . Trouble in sleeping   . Urinary frequency   . Vision changes   . Weakness   . Wears glasses     PAST SURGICAL HISTORY: Past Surgical History:  Procedure Laterality Date  . arm surgery Right   . BACK SURGERY  2004  . COLONOSCOPY    . ESOPHAGOGASTRODUODENOSCOPY    . EYE SURGERY Right    cataract  . SUBMANDIBULAR GLAND EXCISION Right 03/10/2016   Procedure: INTRAORAL  SUBMANDIBULAR  STONE REMOVAL;  Surgeon: JIzora Gala MD;  [facility]  Service: ENT;  Laterality: Right;    SOCIAL HISTORY: Social History   Tobacco Use  . Smoking  status: Never Smoker  . Smokeless tobacco: Never Used  Substance Use Topics  . Alcohol use: No  . Drug use: No  FAMILY HISTORY: Family History  Problem Relation Age of Onset  . Diabetes Mother   . Hypertension Mother   . Hyperlipidemia Mother   . Kidney disease Mother   . Cancer Mother   . Diabetes Brother     ROS: Review of Systems  Constitutional: Negative for weight loss.  Cardiovascular: Negative for chest pain.       Negative chest pressure  Neurological: Negative for headaches.    PHYSICAL EXAM: Blood pressure (!) 168/71, pulse 99, temperature 98.3 F (36.8 C), temperature source Oral, height 5' 10"  (1.778 m), weight 286 lb (129.7 kg), SpO2 95 %. Body mass index is 41.04 kg/m. Physical Exam  Constitutional: He is oriented to person, place, and time. He appears well-developed and well-nourished.  Cardiovascular: Normal rate.  Pulmonary/Chest: Effort normal.  Musculoskeletal: Normal range of motion.  Neurological: He is oriented to person, place, and time.  Skin: Skin is warm and dry.  Psychiatric: He has a normal mood and affect. His behavior is normal.  Vitals reviewed.   RECENT LABS AND TESTS: BMET    Component Value Date/Time   NA 139 09/28/2017 1145   K 4.0 09/28/2017 1145   CL 100 09/28/2017 1145   CO2 24 09/28/2017 1145   GLUCOSE 127 (H) 09/28/2017 1145   GLUCOSE 208 (H) 03/09/2016 1424   BUN 15 09/28/2017 1145   CREATININE 0.91 09/28/2017 1145   CALCIUM 9.4 09/28/2017 1145   GFRNONAA 86 09/28/2017 1145   GFRAA 99 09/28/2017 1145   Lab Results  Component Value Date   HGBA1C 8.3 (H) 09/28/2017   HGBA1C 7.2 (H) 03/09/2016   HGBA1C 7.9 08/12/2015   No results found for: INSULIN CBC    Component Value Date/Time   WBC 10.8 09/28/2017 1145   WBC 11.6 (H) 03/09/2016 1424   RBC 5.32 09/28/2017 1145   RBC 5.17 03/09/2016 1424   HGB 13.8 09/28/2017 1145   HCT 42.3 09/28/2017 1145   PLT 291 03/09/2016 1424   MCV 80 09/28/2017 1145   MCH  25.9 (L) 09/28/2017 1145   MCH 26.7 03/09/2016 1424   MCHC 32.6 09/28/2017 1145   MCHC 33.3 03/09/2016 1424   RDW 14.6 09/28/2017 1145   LYMPHSABS 1.7 09/28/2017 1145   EOSABS 0.1 09/28/2017 1145   BASOSABS 0.0 09/28/2017 1145   Iron/TIBC/Ferritin/ %Sat No results found for: IRON, TIBC, FERRITIN, IRONPCTSAT Lipid Panel     Component Value Date/Time   CHOL 206 (H) 09/28/2017 1145   TRIG 60 09/28/2017 1145   HDL 70 09/28/2017 1145   CHOLHDL 3.8 Ratio 01/09/2008 2228   VLDL 29 01/09/2008 2228   LDLCALC 124 (H) 09/28/2017 1145   Hepatic Function Panel     Component Value Date/Time   PROT 6.7 09/28/2017 1145   ALBUMIN 4.4 09/28/2017 1145   AST 22 09/28/2017 1145   ALT 27 09/28/2017 1145   ALKPHOS 106 09/28/2017 1145   BILITOT 0.4 09/28/2017 1145      Component Value Date/Time   TSH 0.471 09/28/2017 1145   TSH 0.499 02/15/2008 2052   TSH 0.106 (L) 01/09/2008 2228    ASSESSMENT AND PLAN: Essential hypertension  Type 2 diabetes mellitus without complication, without long-term current use of insulin (HCC)  Class 3 severe obesity with serious comorbidity and body mass index (BMI) of 40.0 to 44.9 in adult, unspecified obesity type ([place])  PLAN:  Hypertension We discussed sodium restriction, working on healthy weight loss, and a regular exercise program as the means to achieve improved blood  pressure control. Arlow agreed with this plan and agreed to follow up as directed. We will continue to monitor his blood pressure as well as his progress with the above lifestyle modifications. Max [name] agrees to continue his current medications and will watch for signs of hypotension as he continues his lifestyle modifications. Max [name] agrees to follow up with our clinic in 2 weeks.  Diabetes II Max [name] has been given extensive diabetes education by myself today including ideal fasting and post-prandial blood glucose readings, individual ideal Hgb A1c goals and hypoglycemia prevention. We  discussed the importance of good blood sugar control to decrease the likelihood of diabetic complications such as nephropathy, neuropathy, limb loss, blindness, coronary artery disease, and death. We discussed the importance of intensive lifestyle modification including diet, exercise and weight loss as the first line treatment for diabetes. Max [name] agrees to increase insulin to 35 units at breakfast and decrease insulin to 35 units at lunch (spikes between 6am and 12pm) so he needs more at breakfast. Max [name] agrees to follow up with our clinic in 2 weeks.  Obesity Max [name] is currently in the action stage of change. As such, his goal is to continue with weight loss efforts He has agreed to follow the Category 3 plan Max [name] has been instructed to work up to a goal of 150 minutes of combined cardio and strengthening exercise per week for weight loss and overall health benefits. We discussed the following Behavioral Modification Strategies today: increasing lean protein intake, increasing vegetables, work on meal planning and easy cooking plans, and planning for success   Max [name] has agreed to follow up with our clinic in 2 weeks. He was informed of the importance of frequent follow up visits to maximize his success with intensive lifestyle modifications for his multiple health conditions.   OBESITY BEHAVIORAL INTERVENTION VISIT  Today's visit was # 8   Starting weight: 273 lbs Starting date: 09/28/17 Today's weight : 286 lbs  Today's date: 01/30/2018 Total lbs lost to date: 0 At least 15 minutes were spent on discussing the following behavioral intervention visit.   ASK: We discussed the diagnosis of obesity with Max [name] today and Max [name] agreed to give [name] permission to discuss obesity behavioral modification therapy today.  ASSESS: Max [name] has the diagnosis of obesity and his BMI today is 41.04 Max [name] is in the action stage of change   ADVISE: Max [name] was educated on the  multiple health risks of obesity as well as the benefit of weight loss to improve his health. He was advised of the need for long term treatment and the importance of lifestyle modifications to improve his current health and to decrease his risk of future health problems.  AGREE: Multiple dietary modification options and treatment options were discussed and  Max [name] agreed to follow the recommendations documented in the above note.  ARRANGE: Max [name] was educated on the importance of frequent visits to treat obesity as outlined per CMS and USPSTF guidelines and agreed to schedule his next follow up appointment today.  I, Trixie Dredge, am acting as transcriptionist for Ilene Qua, MD  I have reviewed the above documentation for accuracy and completeness, and I agree with the above. - Ilene Qua, MD

## 2018-02-13 ENCOUNTER — Encounter (INDEPENDENT_AMBULATORY_CARE_PROVIDER_SITE_OTHER): Payer: Self-pay | Admitting: Physician Assistant

## 2018-02-13 ENCOUNTER — Ambulatory Visit (INDEPENDENT_AMBULATORY_CARE_PROVIDER_SITE_OTHER): Payer: Medicare Other | Admitting: Physician Assistant

## 2018-02-13 VITALS — BP 156/71 | HR 84 | Temp 97.6°F | Ht 70.0 in | Wt 285.0 lb

## 2018-02-13 DIAGNOSIS — Z6841 Body Mass Index (BMI) 40.0 and over, adult: Secondary | ICD-10-CM

## 2018-02-13 DIAGNOSIS — I10 Essential (primary) hypertension: Secondary | ICD-10-CM | POA: Diagnosis not present

## 2018-02-13 DIAGNOSIS — E119 Type 2 diabetes mellitus without complications: Secondary | ICD-10-CM | POA: Diagnosis not present

## 2018-02-16 NOTE — Progress Notes (Signed)
Office: [phone]  /  Fax: [phone]   HPI:   Chief Complaint: OBESITY Max [name] is here to discuss his progress with his obesity treatment plan. He is on the Category 3 plan and is following his eating plan approximately 80 % of the time. He states he is walking 30 minutes 4 times per week. Max [name] did well with weight loss. He reports overeating his snack calories with yogurt and salad dressing. He also is not getting enough protein at breakfast. His weight is 285 lb (129.3 kg) today and has had a weight loss of 1 pound over a period of 2 weeks since his last visit. He has gained 12 lbs since starting treatment with [name].  Diabetes II Max [name] has a diagnosis of diabetes type II. He is on insulin and is no longer taking [name]. Max [name] denies any hypoglycemic episodes. Last A1c was at 8.3 He has been working on intensive lifestyle modifications including diet, exercise, and weight loss to help control his blood glucose levels.  Hypertension Max [name] is a 72 y.o. male with hypertension. His blood pressure is elevated today at 156/71. Max [name] denies chest pain or headache. He is working weight loss to help control his blood pressure with the goal of decreasing his risk of heart attack and stroke. Max [name] blood pressure is not currently controlled.  ALLERGIES: Allergies  Allergen Reactions  . Butane Anxiety and Other (See Comments)    Hallucinations REACTIONS ARE SIDE EFFECTS   . Gabapentin Other (See Comments)    Causes agression  . Hydromorphone Swelling    SWELLING REACTION UNSPECIFIED   . Tizanidine Swelling    SWELLING REACTION UNSPECIFIED   . Topamax [Topiramate] Other (See Comments)    Hallucinations   . Buprenorphine Other (See Comments)    UNSPECIFIED REACTION   . Pork-Derived Products     Pt. Refuses pork derived products  . Latex Rash    Localized rash    MEDICATIONS: Current Outpatient Medications on File Prior to Visit  Medication Sig  Dispense Refill  . alfuzosin (UROXATRAL) 10 MG 24 hr tablet Take 10 mg by mouth daily with breakfast.    . amLODipine (NORVASC) 10 MG tablet Take 10 mg by mouth daily.     . Blood Glucose Monitoring Suppl (ONETOUCH VERIO) w/Device KIT 1 kit by Does not apply route daily at 12 noon. Continuous Glucose Monitoring Kit 1 kit 0  . Cholecalciferol (D3-1000 PO) Take 1 capsule by mouth daily.    . DULoxetine (CYMBALTA) 60 MG capsule Take 60 mg by mouth 2 (two) times daily.    . ferrous sulfate 325 (65 FE) MG EC tablet Take 325 mg by mouth daily with breakfast.    . Flaxseed Oil OIL 750 mg daily.    Marland Kitchen glucose blood (ACCU-CHEK AVIVA) test strip Use as instructed 100 each 0  . hydrochlorothiazide (HYDRODIURIL) 25 MG tablet Take 25 mg by mouth daily.    . hydrOXYzine (VISTARIL) 25 MG capsule   1  . Ibuprofen (ADVIL) 200 MG CAPS Take 2 capsules by mouth daily.    . insulin NPH Human (NOVOLIN N) 100 UNIT/ML injection Inject 0.05 mLs (5 Units total) into the skin at bedtime. 10 mL 11  . insulin regular (NOVOLIN R RELION) 250 units/2.27m (100 units/mL) injection INJECT 15 UNITS SUBCUTANEOUSLY THREE TIMES DAILY BEFORE MEAL(S) 20 mL 0  . Lancets (ACCU-CHEK SAFE-T PRO) lancets Use as instructed 100 each 0  . Levorphanol Tartrate 3 MG TABS Take 3 mg  by mouth QID.    Marland Kitchen losartan (COZAAR) 100 MG tablet Take 100 mg by mouth daily.    . Magnesium 400 MG TABS Take 1 tablet by mouth daily.    . naproxen sodium (ANAPROX) 220 MG tablet Take 220 mg by mouth daily as needed (for pain).     Marland Kitchen oxyCODONE (ROXICODONE) 15 MG immediate release tablet Take 10 mg by mouth 3 (three) times daily.     . Potassium 99 MG TABS Take 1 tablet by mouth daily.    . pravastatin (PRAVACHOL) 20 MG tablet Take 40 mg by mouth at bedtime.     . Probiotic Product (PROBIOTIC-10 PO) Take 1 capsule by mouth daily.    . tamsulosin (FLOMAX) 0.4 MG CAPS capsule Take 0.4 mg by mouth daily.     Marland Kitchen topiramate (TOPAMAX) 25 MG tablet Take 25 mg by mouth 2  (two) times daily.    . [name] 1.5 PJ/0.3PR SOPN     . vitamin B-12 (CYANOCOBALAMIN) 1000 MCG tablet Take 1,000 mcg by mouth daily.     No current facility-administered medications on file prior to visit.     PAST MEDICAL HISTORY: Past Medical History:  Diagnosis Date  . ADD (attention deficit disorder)   . Anxiety   . Back pain   . Bipolar 1 disorder ([place])   . Chronic pain   . Constipation   . Decreased hearing   . Depression   . Diabetes mellitus without complication ([place])    Type II  . Dry skin   . DVT (deep venous thrombosis) ([place]) 2004   leg (not sure which leg)  . Dyslipidemia   . Dyspnea    with exertion  . Ear drainage   . Excessive hunger   . Excessive thirst   . Eye pain   . Fatigue   . Frequent urination   . Hard of hearing   . Hyperlipidemia   . Hypertension   . Itching   . Joint pain   . Lactose intolerance   . Leg cramps   . Mouth sores   . Nasal and sinus discharge   . Nausea   . Numbness and tingling    bilateral  . OSA (obstructive sleep apnea)    wears CPAP  . Shortness of breath   . Shortness of breath on exertion   . Sinus congestion   . Stiff neck   . Stress   . Swallowing difficulty   . Swelling of extremity   . Tinnitus   . Trouble in sleeping   . Urinary frequency   . Vision changes   . Weakness   . Wears glasses     PAST SURGICAL HISTORY: Past Surgical History:  Procedure Laterality Date  . arm surgery Right   . BACK SURGERY  2004  . COLONOSCOPY    . ESOPHAGOGASTRODUODENOSCOPY    . EYE SURGERY Right    cataract  . SUBMANDIBULAR GLAND EXCISION Right 03/10/2016   Procedure: INTRAORAL  SUBMANDIBULAR  STONE REMOVAL;  Surgeon: Izora Gala, MD;  Location: [facility];  Service: ENT;  Laterality: Right;    SOCIAL HISTORY: Social History   Tobacco Use  . Smoking status: Never Smoker  . Smokeless tobacco: Never Used  Substance Use Topics  . Alcohol use: No  . Drug use: No    FAMILY HISTORY: Family History  Problem  Relation Age of Onset  . Diabetes Mother   . Hypertension Mother   . Hyperlipidemia Mother   .  Kidney disease Mother   . Cancer Mother   . Diabetes Brother     ROS: Review of Systems  Constitutional: Positive for weight loss.  Cardiovascular: Negative for chest pain.  Neurological: Negative for headaches.  Endo/Heme/Allergies:       Negative for hypoglycemia    PHYSICAL EXAM: Blood pressure (!) 156/71, pulse 84, temperature 97.6 F (36.4 C), temperature source Oral, height 5' 10"  (1.778 m), weight 285 lb (129.3 kg), SpO2 95 %. Body mass index is 40.89 kg/m. Physical Exam  Constitutional: He is oriented to person, place, and time. He appears well-developed and well-nourished.  Cardiovascular: Normal rate.  Pulmonary/Chest: Effort normal.  Musculoskeletal: Normal range of motion.  Neurological: He is oriented to person, place, and time.  Skin: Skin is warm and dry.  Psychiatric: He has a normal mood and affect. His behavior is normal.  Vitals reviewed.   RECENT LABS AND TESTS: BMET    Component Value Date/Time   NA 139 09/28/2017 1145   K 4.0 09/28/2017 1145   CL 100 09/28/2017 1145   CO2 24 09/28/2017 1145   GLUCOSE 127 (H) 09/28/2017 1145   GLUCOSE 208 (H) 03/09/2016 1424   BUN 15 09/28/2017 1145   CREATININE 0.91 09/28/2017 1145   CALCIUM 9.4 09/28/2017 1145   GFRNONAA 86 09/28/2017 1145   GFRAA 99 09/28/2017 1145   Lab Results  Component Value Date   HGBA1C 8.3 (H) 09/28/2017   HGBA1C 7.2 (H) 03/09/2016   HGBA1C 7.9 08/12/2015   No results found for: INSULIN CBC    Component Value Date/Time   WBC 10.8 09/28/2017 1145   WBC 11.6 (H) 03/09/2016 1424   RBC 5.32 09/28/2017 1145   RBC 5.17 03/09/2016 1424   HGB 13.8 09/28/2017 1145   HCT 42.3 09/28/2017 1145   PLT 291 03/09/2016 1424   MCV 80 09/28/2017 1145   MCH 25.9 (L) 09/28/2017 1145   MCH 26.7 03/09/2016 1424   MCHC 32.6 09/28/2017 1145   MCHC 33.3 03/09/2016 1424   RDW 14.6 09/28/2017 1145     LYMPHSABS 1.7 09/28/2017 1145   EOSABS 0.1 09/28/2017 1145   BASOSABS 0.0 09/28/2017 1145   Iron/TIBC/Ferritin/ %Sat No results found for: IRON, TIBC, FERRITIN, IRONPCTSAT Lipid Panel     Component Value Date/Time   CHOL 206 (H) 09/28/2017 1145   TRIG 60 09/28/2017 1145   HDL 70 09/28/2017 1145   CHOLHDL 3.8 Ratio 01/09/2008 2228   VLDL 29 01/09/2008 2228   LDLCALC 124 (H) 09/28/2017 1145   Hepatic Function Panel     Component Value Date/Time   PROT 6.7 09/28/2017 1145   ALBUMIN 4.4 09/28/2017 1145   AST 22 09/28/2017 1145   ALT 27 09/28/2017 1145   ALKPHOS 106 09/28/2017 1145   BILITOT 0.4 09/28/2017 1145      Component Value Date/Time   TSH 0.471 09/28/2017 1145   TSH 0.499 02/15/2008 2052   TSH 0.106 (L) 01/09/2008 2228   Results for SAVAS, ELVIN A (MRN [id number]) as of 02/16/2018 09:29  Ref. Range 09/28/2017 11:45  Vitamin D, 25-Hydroxy Latest Ref Range: 30.0 - 100.0 ng/mL 50.1   ASSESSMENT AND PLAN: Type 2 diabetes mellitus without complication, without long-term current use of insulin (HCC)  Essential hypertension  Class 3 severe obesity with serious comorbidity and body mass index (BMI) of 40.0 to 44.9 in adult, unspecified obesity type ([place])  PLAN:  Diabetes II Max [name] has been given extensive diabetes education by myself today including ideal fasting and post-prandial  blood glucose readings, individual ideal Hgb A1c goals and hypoglycemia prevention. We discussed the importance of good blood sugar control to decrease the likelihood of diabetic complications such as nephropathy, neuropathy, limb loss, blindness, coronary artery disease, and death. We discussed the importance of intensive lifestyle modification including diet, exercise and weight loss as the first line treatment for diabetes. Max [name] will continue with his diabetes medications and weight loss. He will follow up at the agreed upon time.  Hypertension We discussed sodium restriction,  working on healthy weight loss, and a regular exercise program as the means to achieve improved blood pressure control. Max [name] agreed with this plan and agreed to follow up as directed. We will continue to monitor his blood pressure as well as his progress with the above lifestyle modifications. He will continue his medications as prescribed and will watch for signs of hypotension as he continues his lifestyle modifications.  Obesity Jahmai is currently in the action stage of change. As such, his goal is to continue with weight loss efforts He has agreed to follow the Category 3 plan Max [name] has been instructed to work up to a goal of 150 minutes of combined cardio and strengthening exercise per week for weight loss and overall health benefits. We discussed the following Behavioral Modification Strategies today: better snacking choices, increasing lean protein intake and work on meal planning and easy cooking plans  Max [name] has agreed to follow up with our clinic in 2 weeks. He was informed of the importance of frequent follow up visits to maximize his success with intensive lifestyle modifications for his multiple health conditions.   OBESITY BEHAVIORAL INTERVENTION VISIT  Today's visit was # 9   Starting weight: 273 lbs Starting date: 09/28/2017 Today's weight : 285 lbs  Today's date: 02/13/2018 Total lbs lost to date: 0 At least 15 minutes were spent on discussing the following behavioral intervention visit.   ASK: We discussed the diagnosis of obesity with Max [name] today and Max [name] agreed to give [name] permission to discuss obesity behavioral modification therapy today.  ASSESS: Max [name] has the diagnosis of obesity and his BMI today is 40.89 Max [name] is in the action stage of change   ADVISE: Max [name] was educated on the multiple health risks of obesity as well as the benefit of weight loss to improve his health. He was advised of the need for long term treatment and the  importance of lifestyle modifications to improve his current health and to decrease his risk of future health problems.  AGREE: Multiple dietary modification options and treatment options were discussed and  Max [name] agreed to follow the recommendations documented in the above note.  ARRANGE: Max [name] was educated on the importance of frequent visits to treat obesity as outlined per CMS and USPSTF guidelines and agreed to schedule his next follow up appointment today.  Corey Skains, am acting as transcriptionist for Abby Potash, PA-C I, Abby Potash, PA-C have reviewed above note and agree with its content

## 2018-03-01 ENCOUNTER — Ambulatory Visit (INDEPENDENT_AMBULATORY_CARE_PROVIDER_SITE_OTHER): Payer: Medicare Other | Admitting: Family Medicine

## 2018-03-01 VITALS — BP 147/72 | HR 101 | Temp 98.4°F | Ht 70.0 in | Wt 282.0 lb

## 2018-03-01 DIAGNOSIS — I10 Essential (primary) hypertension: Secondary | ICD-10-CM

## 2018-03-01 DIAGNOSIS — E1165 Type 2 diabetes mellitus with hyperglycemia: Secondary | ICD-10-CM

## 2018-03-01 DIAGNOSIS — Z6841 Body Mass Index (BMI) 40.0 and over, adult: Secondary | ICD-10-CM

## 2018-03-01 DIAGNOSIS — Z794 Long term (current) use of insulin: Secondary | ICD-10-CM | POA: Diagnosis not present

## 2018-03-06 NOTE — Progress Notes (Signed)
Office: [phone]  /  Fax: [phone]   HPI:   Chief Complaint: OBESITY Max [name] is here to discuss his progress with his obesity treatment plan. He is on the Category 3 plan and is following his eating plan approximately 60 % of the time. He states he is walking for 15-30 minutes 5 times per week. Price reports that he has been trying to stick to the meal plan more strictly. He has been increasing vegetables at lunch. He has been more mindful of excessive snack calories.  His weight is 282 lb (127.9 kg) today and has had a weight loss of 3 pounds over a period of 2 to 3 weeks since his last visit. He has lost 0 lbs since starting treatment with [name].  Diabetes II with Hyperglycemia Max [name] has a diagnosis of diabetes type II. Max [name] has had numerous carbohydrate indulgences over Thanksgiving. He states fasting BGs range between 49 and 242 (lows of 40-50's around 1 AM). He had to stop insulin at night, but he is taking 15-20 units with breakfast and lunch. Last A1c was 8.3. He has been working on intensive lifestyle modifications including diet, exercise, and weight loss to help control his blood glucose levels.  Hypertension Max [name] is a 72 y.o. male with hypertension. Max [name]'s blood pressure is elevated again today, but he is taking cold tablets. He denies chest pain, chest pressure, or headaches. He is working weight loss to help control his blood pressure with the goal of decreasing his risk of heart attack and stroke. Max [name]'s blood pressure is not currently controlled.  ALLERGIES: Allergies  Allergen Reactions  . Butane Anxiety and Other (See Comments)    Hallucinations REACTIONS ARE SIDE EFFECTS   . Gabapentin Other (See Comments)    Causes agression  . Hydromorphone Swelling    SWELLING REACTION UNSPECIFIED   . Tizanidine Swelling    SWELLING REACTION UNSPECIFIED   . Topamax [Topiramate] Other (See Comments)    Hallucinations   . Buprenorphine Other (See  Comments)    UNSPECIFIED REACTION   . Pork-Derived Products     Pt. Refuses pork derived products  . Latex Rash    Localized rash    MEDICATIONS: Current Outpatient Medications on File Prior to Visit  Medication Sig Dispense Refill  . alfuzosin (UROXATRAL) 10 MG 24 hr tablet Take 10 mg by mouth daily with breakfast.    . amLODipine (NORVASC) 10 MG tablet Take 10 mg by mouth daily.     . Blood Glucose Monitoring Suppl (ONETOUCH VERIO) w/Device KIT 1 kit by Does not apply route daily at 12 noon. Continuous Glucose Monitoring Kit 1 kit 0  . Cholecalciferol (D3-1000 PO) Take 1 capsule by mouth daily.    . DULoxetine (CYMBALTA) 60 MG capsule Take 60 mg by mouth 2 (two) times daily.    . ferrous sulfate 325 (65 FE) MG EC tablet Take 325 mg by mouth daily with breakfast.    . Flaxseed Oil OIL 750 mg daily.    Marland Kitchen glucose blood (ACCU-CHEK AVIVA) test strip Use as instructed 100 each 0  . hydrochlorothiazide (HYDRODIURIL) 25 MG tablet Take 25 mg by mouth daily.    . hydrOXYzine (VISTARIL) 25 MG capsule   1  . Ibuprofen (ADVIL) 200 MG CAPS Take 2 capsules by mouth daily.    . insulin NPH Human (NOVOLIN N) 100 UNIT/ML injection Inject 0.05 mLs (5 Units total) into the skin at bedtime. 10 mL 11  . insulin regular (NOVOLIN R RELION)  250 units/2.39m (100 units/mL) injection INJECT 15 UNITS SUBCUTANEOUSLY THREE TIMES DAILY BEFORE MEAL(S) 20 mL 0  . Lancets (ACCU-CHEK SAFE-T PRO) lancets Use as instructed 100 each 0  . Levorphanol Tartrate 3 MG TABS Take 3 mg by mouth QID.    .Marland Kitchenlosartan (COZAAR) 100 MG tablet Take 100 mg by mouth daily.    . Magnesium 400 MG TABS Take 1 tablet by mouth daily.    . naproxen sodium (ANAPROX) 220 MG tablet Take 220 mg by mouth daily as needed (for pain).     .Marland KitchenoxyCODONE (ROXICODONE) 15 MG immediate release tablet Take 10 mg by mouth 3 (three) times daily.     . Potassium 99 MG TABS Take 1 tablet by mouth daily.    . pravastatin (PRAVACHOL) 20 MG tablet Take 40 mg by mouth  at bedtime.     . Probiotic Product (PROBIOTIC-10 PO) Take 1 capsule by mouth daily.    . tamsulosin (FLOMAX) 0.4 MG CAPS capsule Take 0.4 mg by mouth daily.     .Marland Kitchentopiramate (TOPAMAX) 25 MG tablet Take 25 mg by mouth 2 (two) times daily.    . [name] 1.5 MZO/1.0RUSOPN     . vitamin B-12 (CYANOCOBALAMIN) 1000 MCG tablet Take 1,000 mcg by mouth daily.     No current facility-administered medications on file prior to visit.     PAST MEDICAL HISTORY: Past Medical History:  Diagnosis Date  . ADD (attention deficit disorder)   . Anxiety   . Back pain   . Bipolar 1 disorder ([place]   . Chronic pain   . Constipation   . Decreased hearing   . Depression   . Diabetes mellitus without complication ([facility]    Type II  . Dry skin   . [place]   leg (not sure which leg)  . Dyslipidemia   . Dyspnea    with exertion  . Ear drainage   . Excessive hunger   . Excessive thirst   . Eye pain   . Fatigue   . Frequent urination   . Hard of hearing   . Hyperlipidemia   . Hypertension   . Itching   . Joint pain   . Lactose intolerance   . Leg cramps   . Mouth sores   . Nasal and sinus discharge   . Nausea   . Numbness and tingling    bilateral  . OSA (obstructive sleep apnea)    wears CPAP  . Shortness of breath   . Shortness of breath on exertion   . Sinus congestion   . Stiff neck   . Stress   . Swallowing difficulty   . Swelling of extremity   . Tinnitus   . Trouble in sleeping   . Urinary frequency   . Vision changes   . Weakness   . Wears glasses     PAST SURGICAL HISTORY: Past Surgical History:  Procedure Laterality Date  . arm surgery Right   . BACK SURGERY  2004  . COLONOSCOPY    . ESOPHAGOGASTRODUODENOSCOPY    . EYE SURGERY Right    cataract  . SUBMANDIBULAR GLAND EXCISION Right 03/10/2016   Procedure: INTRAORAL  SUBMANDIBULAR  STONE REMOVAL;  Surgeon: JIzora Gala MD;  [place] [facility]  Service: ENT;  Laterality: Right;     SOCIAL HISTORY: Social History   Tobacco Use  . Smoking status: Never Smoker  . Smokeless tobacco: Never Used  Substance Use Topics  .  Alcohol use: No  . Drug use: No    FAMILY HISTORY: Family History  Problem Relation Age of Onset  . Diabetes Mother   . Hypertension Mother   . Hyperlipidemia Mother   . Kidney disease Mother   . Cancer Mother   . Diabetes Brother     ROS: Review of Systems  Constitutional: Positive for weight loss.  Cardiovascular: Negative for chest pain.       Negative chest pressure  Neurological: Negative for headaches.    PHYSICAL EXAM: Blood pressure (!) 147/72, pulse (!) 101, temperature 98.4 F (36.9 C), temperature source Oral, height _0  (1.778 m), weight 282 lb (127.9 kg), SpO2 94 %. Body mass index is 40.46 kg/m. Physical Exam  Constitutional: He is oriented to person, place, and time. He appears well-developed and well-nourished.  Cardiovascular: Normal rate.  Pulmonary/Chest: Effort normal.  Musculoskeletal: Normal range of motion.  Neurological: He is oriented to person, place, and time.  Skin: Skin is warm and dry.  Psychiatric: He has a normal mood and affect. His behavior is normal.  Vitals reviewed.   RECENT LABS AND TESTS: BMET    Component Value Date/Time   NA 139 09/28/2017 1145   K 4.0 09/28/2017 1145   CL 100 09/28/2017 1145   CO2 24 09/28/2017 1145   GLUCOSE 127 (H) 09/28/2017 1145   GLUCOSE 208 (H) 03/09/2016 1424   BUN 15 09/28/2017 1145   CREATININE 0.91 09/28/2017 1145   CALCIUM 9.4 09/28/2017 1145   GFRNONAA 86 09/28/2017 1145   GFRAA 99 09/28/2017 1145   Lab Results  Component Value Date   HGBA1C 8.3 (H) 09/28/2017   HGBA1C 7.2 (H) 03/09/2016   HGBA1C 7.9 08/12/2015   No results found for: INSULIN CBC    Component Value Date/Time   WBC 10.8 09/28/2017 1145   WBC 11.6 (H) 03/09/2016 1424   RBC 5.32 09/28/2017 1145   RBC 5.17 03/09/2016 1424   HGB 13.8 09/28/2017 1145   HCT 42.3  09/28/2017 1145   PLT 291 03/09/2016 1424   MCV 80 09/28/2017 1145   MCH 25.9 (L) 09/28/2017 1145   MCH 26.7 03/09/2016 1424   MCHC 32.6 09/28/2017 1145   MCHC 33.3 03/09/2016 1424   RDW 14.6 09/28/2017 1145   LYMPHSABS 1.7 09/28/2017 1145   EOSABS 0.1 09/28/2017 1145   BASOSABS 0.0 09/28/2017 1145   Iron/TIBC/Ferritin/ %Sat No results found for: IRON, TIBC, FERRITIN, IRONPCTSAT Lipid Panel     Component Value Date/Time   CHOL 206 (H) 09/28/2017 1145   TRIG 60 09/28/2017 1145   HDL 70 09/28/2017 1145   CHOLHDL 3.8 Ratio 01/09/2008 2228   VLDL 29 01/09/2008 2228   LDLCALC 124 (H) 09/28/2017 1145   Hepatic Function Panel     Component Value Date/Time   PROT 6.7 09/28/2017 1145   ALBUMIN 4.4 09/28/2017 1145   AST 22 09/28/2017 1145   ALT 27 09/28/2017 1145   ALKPHOS 106 09/28/2017 1145   BILITOT 0.4 09/28/2017 1145      Component Value Date/Time   TSH 0.471 09/28/2017 1145   TSH 0.499 02/15/2008 2052   TSH 0.106 (L) 01/09/2008 2228    ASSESSMENT AND PLAN: Type 2 diabetes mellitus with hyperglycemia, with long-term current use of insulin (HCC)  Essential hypertension  Class 3 severe obesity with serious comorbidity and body mass index (BMI) of 40.0 to 44.9 in adult, unspecified obesity type ([place])  PLAN:  Diabetes II with Hyperglycemia Max [name] has been given extensive diabetes  education by myself today including ideal fasting and post-prandial blood glucose readings, individual ideal Hgb A1c goals and hypoglycemia prevention. We discussed the importance of good blood sugar control to decrease the likelihood of diabetic complications such as nephropathy, neuropathy, limb loss, blindness, coronary artery disease, and death. We discussed the importance of intensive lifestyle modification including diet, exercise and weight loss as the first line treatment for diabetes. Max [name] agrees to continue his diabetes medications, he can continue BID insulin, and continue statin and  ARB. Max [name] agrees to follow up with our clinic in 2 weeks.  Hypertension We discussed sodium restriction, working on healthy weight loss, and a regular exercise program as the means to achieve improved blood pressure control. Max [name] agreed with this plan and agreed to follow up as directed. We will continue to monitor his blood pressure as well as his progress with the above lifestyle modifications. Max [name] agrees to continue his medications and will watch for signs of hypotension as he continues his lifestyle modifications. He is to check his blood pressure 2-3 times per week and bring in log at next appointment. We will increase medications at next appointment if blood pressure is still elevated. Kadir agrees to follow up with our clinic in 2 weeks.  I spent > than 50% of the 15 minute visit on counseling as documented in the note.  Obesity Max [name] is currently in the action stage of change. As such, his goal is to continue with weight loss efforts He has agreed to follow the Category 3 plan Max [name] has been instructed to work up to a goal of 150 minutes of combined cardio and strengthening exercise per week for weight loss and overall health benefits. We discussed the following Behavioral Modification Strategies today: increasing lean protein intake, decrease eating out, work on meal planning and easy cooking plans, no skipping meals, holiday eating strategies, and travel eating strategies   Max [name] has agreed to follow up with our clinic in 2 weeks. He was informed of the importance of frequent follow up visits to maximize his success with intensive lifestyle modifications for his multiple health conditions.   OBESITY BEHAVIORAL INTERVENTION VISIT  Today's visit was # 10   Starting weight: 273 lbs Starting date: 09/28/17 Today's weight : 282 lbs Today's date: 03/01/2018 Total lbs lost to date: 0    ASK: We discussed the diagnosis of obesity with Max [name] today and  Max [name] agreed to give [name] permission to discuss obesity behavioral modification therapy today.  ASSESS: Max [name] has the diagnosis of obesity and his BMI today is 40.46 Max [name] is in the action stage of change   ADVISE: Max [name] was educated on the multiple health risks of obesity as well as the benefit of weight loss to improve his health. He was advised of the need for long term treatment and the importance of lifestyle modifications to improve his current health and to decrease his risk of future health problems.  AGREE: Multiple dietary modification options and treatment options were discussed and  Max [name] agreed to follow the recommendations documented in the above note.  ARRANGE: Max [name] was educated on the importance of frequent visits to treat obesity as outlined per CMS and USPSTF guidelines and agreed to schedule his next follow up appointment today.  I, Trixie Dredge, am acting as transcriptionist for Ilene Qua, MD  I have reviewed the above documentation for accuracy and completeness, and I agree with the above. - Ilene Qua, MD

## 2018-03-15 ENCOUNTER — Encounter (INDEPENDENT_AMBULATORY_CARE_PROVIDER_SITE_OTHER): Payer: Self-pay | Admitting: Family Medicine

## 2018-03-15 ENCOUNTER — Ambulatory Visit (INDEPENDENT_AMBULATORY_CARE_PROVIDER_SITE_OTHER): Payer: Medicare Other | Admitting: Family Medicine

## 2018-03-15 VITALS — BP 135/71 | HR 98 | Temp 97.8°F | Ht 70.0 in | Wt 283.0 lb

## 2018-03-15 DIAGNOSIS — Z794 Long term (current) use of insulin: Secondary | ICD-10-CM | POA: Diagnosis not present

## 2018-03-15 DIAGNOSIS — Z6841 Body Mass Index (BMI) 40.0 and over, adult: Secondary | ICD-10-CM | POA: Diagnosis not present

## 2018-03-15 DIAGNOSIS — I10 Essential (primary) hypertension: Secondary | ICD-10-CM

## 2018-03-15 DIAGNOSIS — E1165 Type 2 diabetes mellitus with hyperglycemia: Secondary | ICD-10-CM

## 2018-03-16 NOTE — Progress Notes (Signed)
Office: [phone]  /  Fax: [phone]   HPI:   Chief Complaint: OBESITY Max [name] is here to discuss his progress with his obesity treatment plan. He is on the Category 3 plan and is following his eating plan approximately 70 % of the time. He states he is walking for 30 minutes 4 times per week. Max [name] deviated off the meal plan yesterday and went to [employer], and got a corned beef sandwich and ice cream. He is using balsamic vinaigrette dressing and may be overdoing it. His weight is 283 lb (128.4 kg) today and has gained 1 pound since his last visit. He has lost 0 lbs since starting treatment with [name].  Diabetes II with Hyperglycemia Max [name] has a diagnosis of diabetes type II. Max [name] states fasting BGs range between 49 and 93, he notes carbohydrate cravings. He also notes BGs elevated after giving blood and plasma. Last A1c was 8.3. He has been working on intensive lifestyle modifications including diet, exercise, and weight loss to help control his blood glucose levels.  Hypertension Max [name] is a 72 y.o. male with hypertension. Max [name]'s blood pressure is controlled today, but he voices blood pressure has been elevated at home. He denies chest pain, chest pressure, or headaches. He is working weight loss to help control his blood pressure with the goal of decreasing his risk of heart attack and stroke.   ASSESSMENT AND PLAN:  Type 2 diabetes mellitus with hyperglycemia, with long-term current use of insulin (HCC)  Essential hypertension  Class 3 severe obesity with serious comorbidity and body mass index (BMI) of 40.0 to 44.9 in adult, unspecified obesity type ([place])  PLAN:  Diabetes II with Hyperglycemia Max [name] has been given extensive diabetes education by myself today including ideal fasting and post-prandial blood glucose readings, individual ideal Hgb A1c goals and hypoglycemia prevention. We discussed the importance of good blood sugar control to decrease  the likelihood of diabetic complications such as nephropathy, neuropathy, limb loss, blindness, coronary artery disease, and death. We discussed the importance of intensive lifestyle modification including diet, exercise and weight loss as the first line treatment for diabetes. Max [name] agrees to continue his current diabetes medications, except he is to stop 70/30 insulin at dinner, continue 20 units at breakfast, and continue 20 units at lunch. Max [name] agrees to follow up with our clinic in 2 weeks.  Hypertension We discussed sodium restriction, working on healthy weight loss, and a regular exercise program as the means to achieve improved blood pressure control. Max [name] agreed with this plan and agreed to follow up as directed. We will continue to monitor his blood pressure as well as his progress with the above lifestyle modifications. Max [name] agrees to continue his medications and will watch for signs of hypotension as he continues his lifestyle modifications. Max [name] agrees to follow up with our clinic in 2 weeks.  Obesity Max [name] is currently in the action stage of change. As such, his goal is to continue with weight loss efforts He has agreed to follow the Category 3 plan Max [name] has been instructed to work up to a goal of 150 minutes of combined cardio and strengthening exercise per week for weight loss and overall health benefits. We discussed the following Behavioral Modification Strategies today: decrease eating out, work on meal planning and easy cooking plans, celebration eating strategies, and planning for success    Max [name] has agreed to follow up with our clinic in 2 weeks. He was informed of the importance of frequent follow up  visits to maximize his success with intensive lifestyle modifications for his multiple health conditions.  ALLERGIES: Allergies  Allergen Reactions  . Butane Anxiety and Other (See Comments)    Hallucinations REACTIONS ARE SIDE EFFECTS   . Gabapentin  Other (See Comments)    Causes agression  . Hydromorphone Swelling    SWELLING REACTION UNSPECIFIED   . Tizanidine Swelling    SWELLING REACTION UNSPECIFIED   . Topamax [Topiramate] Other (See Comments)    Hallucinations   . Buprenorphine Other (See Comments)    UNSPECIFIED REACTION   . Pork-Derived Products     Pt. Refuses pork derived products  . Latex Rash    Localized rash    MEDICATIONS: Current Outpatient Medications on File Prior to Visit  Medication Sig Dispense Refill  . alfuzosin (UROXATRAL) 10 MG 24 hr tablet Take 10 mg by mouth daily with breakfast.    . amLODipine (NORVASC) 10 MG tablet Take 10 mg by mouth daily.     . Blood Glucose Monitoring Suppl (ONETOUCH VERIO) w/Device KIT 1 kit by Does not apply route daily at 12 noon. Continuous Glucose Monitoring Kit 1 kit 0  . Cholecalciferol (D3-1000 PO) Take 1 capsule by mouth daily.    . DULoxetine (CYMBALTA) 60 MG capsule Take 60 mg by mouth 2 (two) times daily.    . ferrous sulfate 325 (65 FE) MG EC tablet Take 325 mg by mouth daily with breakfast.    . Flaxseed Oil OIL 750 mg daily.    Marland Kitchen glucose blood (ACCU-CHEK AVIVA) test strip Use as instructed 100 each 0  . hydrochlorothiazide (HYDRODIURIL) 25 MG tablet Take 25 mg by mouth daily.    . hydrOXYzine (VISTARIL) 25 MG capsule   1  . Ibuprofen (ADVIL) 200 MG CAPS Take 2 capsules by mouth daily.    . insulin regular (NOVOLIN R RELION) 250 units/2.72m (100 units/mL) injection INJECT 15 UNITS SUBCUTANEOUSLY THREE TIMES DAILY BEFORE MEAL(S) (Patient taking differently: INJECT 20 UNITS SUBCUTANEOUSLY with breakfast and lunch only) 20 mL 0  . Lancets (ACCU-CHEK SAFE-T PRO) lancets Use as instructed 100 each 0  . Levorphanol Tartrate 3 MG TABS Take 3 mg by mouth QID.    .Marland Kitchenlosartan (COZAAR) 100 MG tablet Take 100 mg by mouth daily.    . Magnesium 400 MG TABS Take 1 tablet by mouth daily.    . naproxen sodium (ANAPROX) 220 MG tablet Take 220 mg by mouth daily as needed (for  pain).     .Marland KitchenoxyCODONE (ROXICODONE) 15 MG immediate release tablet Take 10 mg by mouth 3 (three) times daily.     . Potassium 99 MG TABS Take 1 tablet by mouth daily.    . pravastatin (PRAVACHOL) 20 MG tablet Take 40 mg by mouth at bedtime.     . Probiotic Product (PROBIOTIC-10 PO) Take 1 capsule by mouth daily.    . tamsulosin (FLOMAX) 0.4 MG CAPS capsule Take 0.4 mg by mouth daily.     .Marland Kitchentopiramate (TOPAMAX) 25 MG tablet Take 25 mg by mouth 2 (two) times daily.    . [name] 1.5 MUY/4.0HKSOPN     . vitamin B-12 (CYANOCOBALAMIN) 1000 MCG tablet Take 1,000 mcg by mouth daily.     No current facility-administered medications on file prior to visit.     PAST MEDICAL HISTORY: Past Medical History:  Diagnosis Date  . ADD (attention deficit disorder)   . Anxiety   . Back pain   . Bipolar 1 disorder ([place]   .  Chronic pain   . Constipation   . Decreased hearing   . Depression   . Diabetes mellitus without complication ([place])    Type II  . Dry skin   . DVT (deep venous thrombosis) ([place]) 2004   leg (not sure which leg)  . Dyslipidemia   . Dyspnea    with exertion  . Ear drainage   . Excessive hunger   . Excessive thirst   . Eye pain   . Fatigue   . Frequent urination   . Hard of hearing   . Hyperlipidemia   . Hypertension   . Itching   . Joint pain   . Lactose intolerance   . Leg cramps   . Mouth sores   . Nasal and sinus discharge   . Nausea   . Numbness and tingling    bilateral  . OSA (obstructive sleep apnea)    wears CPAP  . Shortness of breath   . Shortness of breath on exertion   . Sinus congestion   . Stiff neck   . Stress   . Swallowing difficulty   . Swelling of extremity   . Tinnitus   . Trouble in sleeping   . Urinary frequency   . Vision changes   . Weakness   . Wears glasses     PAST SURGICAL HISTORY: Past Surgical History:  Procedure Laterality Date  . arm surgery Right   . BACK SURGERY  2004  . COLONOSCOPY    . ESOPHAGOGASTRODUODENOSCOPY     . EYE SURGERY Right    cataract  . SUBMANDIBULAR GLAND EXCISION Right 03/10/2016   Procedure: INTRAORAL  SUBMANDIBULAR  STONE REMOVAL;  Surgeon: Izora Gala, MD;  Location: [facility];  Service: ENT;  Laterality: Right;    SOCIAL HISTORY: Social History   Tobacco Use  . Smoking status: Never Smoker  . Smokeless tobacco: Never Used  Substance Use Topics  . Alcohol use: No  . Drug use: No    FAMILY HISTORY: Family History  Problem Relation Age of Onset  . Diabetes Mother   . Hypertension Mother   . Hyperlipidemia Mother   . Kidney disease Mother   . Cancer Mother   . Diabetes Brother     ROS: Review of Systems  Constitutional: Negative for weight loss.  Cardiovascular: Negative for chest pain.       Negative chest pressure  Neurological: Negative for headaches.  Endo/Heme/Allergies:       Negative hypoglycemia    PHYSICAL EXAM: Blood pressure 135/71, pulse 98, temperature 97.8 F (36.6 C), temperature source Oral, height 5' 10"  (1.778 m), weight 283 lb (128.4 kg), SpO2 92 %. Body mass index is 40.61 kg/m. Physical Exam Vitals signs reviewed.  Constitutional:      Appearance: Normal appearance. He is obese.  Cardiovascular:     Rate and Rhythm: Normal rate.     Pulses: Normal pulses.  Pulmonary:     Effort: Pulmonary effort is normal.  Musculoskeletal: Normal range of motion.  Skin:    General: Skin is warm and dry.  Neurological:     Mental Status: He is alert and oriented to person, place, and time.  Psychiatric:        Mood and Affect: Mood normal.        Behavior: Behavior normal.     RECENT LABS AND TESTS: BMET    Component Value Date/Time   NA 139 09/28/2017 1145   K 4.0 09/28/2017 1145   CL 100  09/28/2017 1145   CO2 24 09/28/2017 1145   GLUCOSE 127 (H) 09/28/2017 1145   GLUCOSE 208 (H) 03/09/2016 1424   BUN 15 09/28/2017 1145   CREATININE 0.91 09/28/2017 1145   CALCIUM 9.4 09/28/2017 1145   GFRNONAA 86 09/28/2017 1145   GFRAA 99  09/28/2017 1145   Lab Results  Component Value Date   HGBA1C 8.3 (H) 09/28/2017   HGBA1C 7.2 (H) 03/09/2016   HGBA1C 7.9 08/12/2015   No results found for: INSULIN CBC    Component Value Date/Time   WBC 10.8 09/28/2017 1145   WBC 11.6 (H) 03/09/2016 1424   RBC 5.32 09/28/2017 1145   RBC 5.17 03/09/2016 1424   HGB 13.8 09/28/2017 1145   HCT 42.3 09/28/2017 1145   PLT 291 03/09/2016 1424   MCV 80 09/28/2017 1145   MCH 25.9 (L) 09/28/2017 1145   MCH 26.7 03/09/2016 1424   MCHC 32.6 09/28/2017 1145   MCHC 33.3 03/09/2016 1424   RDW 14.6 09/28/2017 1145   LYMPHSABS 1.7 09/28/2017 1145   EOSABS 0.1 09/28/2017 1145   BASOSABS 0.0 09/28/2017 1145   Iron/TIBC/Ferritin/ %Sat No results found for: IRON, TIBC, FERRITIN, IRONPCTSAT Lipid Panel     Component Value Date/Time   CHOL 206 (H) 09/28/2017 1145   TRIG 60 09/28/2017 1145   HDL 70 09/28/2017 1145   CHOLHDL 3.8 Ratio 01/09/2008 2228   VLDL 29 01/09/2008 2228   LDLCALC 124 (H) 09/28/2017 1145   Hepatic Function Panel     Component Value Date/Time   PROT 6.7 09/28/2017 1145   ALBUMIN 4.4 09/28/2017 1145   AST 22 09/28/2017 1145   ALT 27 09/28/2017 1145   ALKPHOS 106 09/28/2017 1145   BILITOT 0.4 09/28/2017 1145      Component Value Date/Time   TSH 0.471 09/28/2017 1145   TSH 0.499 02/15/2008 2052   TSH 0.106 (L) 01/09/2008 2228      OBESITY BEHAVIORAL INTERVENTION VISIT  Today's visit was # 11   Starting weight: 273 lbs Starting date: 09/28/17 Today's weight : 283 lbs Today's date: 03/15/2018 Total lbs lost to date: 0 At least 15 minutes were spent on discussing the following behavioral intervention visit.   ASK: We discussed the diagnosis of obesity with Max [name] today and Max [name] agreed to give [name] permission to discuss obesity behavioral modification therapy today.  ASSESS: Areon has the diagnosis of obesity and his BMI today is 40.61 Izan is in the action stage of change    ADVISE: Jeris was educated on the multiple health risks of obesity as well as the benefit of weight loss to improve his health. He was advised of the need for long term treatment and the importance of lifestyle modifications to improve his current health and to decrease his risk of future health problems.  AGREE: Multiple dietary modification options and treatment options were discussed and  Max [name] agreed to follow the recommendations documented in the above note.  ARRANGE: Max [name] was educated on the importance of frequent visits to treat obesity as outlined per CMS and USPSTF guidelines and agreed to schedule his next follow up appointment today.  I, Trixie Dredge, am acting as transcriptionist for Ilene Qua, MD  I have reviewed the above documentation for accuracy and completeness, and I agree with the above. - Ilene Qua, MD

## 2018-03-30 ENCOUNTER — Telehealth (INDEPENDENT_AMBULATORY_CARE_PROVIDER_SITE_OTHER): Payer: Self-pay | Admitting: Family Medicine

## 2018-03-30 NOTE — Telephone Encounter (Signed)
Spoke with the patient and told him to take his medication as prescribed as 1.5 weekly. Pt verbalized understanding. Max [name], [name]

## 2018-03-30 NOTE — Telephone Encounter (Signed)
Patient called requested call back in regards to questions he has for RX [name]. Phone [phone]

## 2018-04-06 ENCOUNTER — Encounter (INDEPENDENT_AMBULATORY_CARE_PROVIDER_SITE_OTHER): Payer: Self-pay | Admitting: Family Medicine

## 2018-04-06 ENCOUNTER — Ambulatory Visit (INDEPENDENT_AMBULATORY_CARE_PROVIDER_SITE_OTHER): Payer: Medicare Other | Admitting: Family Medicine

## 2018-04-06 VITALS — BP 169/71 | HR 83 | Temp 98.1°F | Ht 70.0 in | Wt 277.0 lb

## 2018-04-06 DIAGNOSIS — Z794 Long term (current) use of insulin: Secondary | ICD-10-CM | POA: Diagnosis not present

## 2018-04-06 DIAGNOSIS — Z6839 Body mass index (BMI) 39.0-39.9, adult: Secondary | ICD-10-CM

## 2018-04-06 DIAGNOSIS — I10 Essential (primary) hypertension: Secondary | ICD-10-CM

## 2018-04-06 DIAGNOSIS — E1165 Type 2 diabetes mellitus with hyperglycemia: Secondary | ICD-10-CM

## 2018-04-06 MED ORDER — CHLORTHALIDONE 25 MG PO TABS: 25.0000 mg | ORAL_TABLET | Freq: Every day | ORAL | 0 refills | Status: DC

## 2018-04-08 NOTE — Progress Notes (Signed)
Office: [phone]  /  Fax: [phone]   HPI:   Chief Complaint: OBESITY Max [name] is here to discuss his progress with his obesity treatment plan. He is on the Category 3 plan and is following his eating plan approximately 90 % of the time. He states he is walking for 30 minutes 3 times per week. Max [name] restarted [name] and has felt well. He stayed at home for the holidays. He realized that some of the chicken strips he has been eating has potato flour and starch.  His weight is 277 lb (125.6 kg) today and has had a weight loss of 6 pounds over a period of 3 weeks since his last visit. He has lost 0 lbs since starting treatment with [name].  Diabetes II with Hyperglycemia Max [name] has a diagnosis of diabetes type II. Max [name] stopped night time Novolin and restarted [name]. He is only taking 10 units of Novolin at night if needed. He states fasting BGs range between 77 and 170's (most range between 130 and 140's). He denies any hypoglycemic episodes. Last A1c was 8.3. He has been working on intensive lifestyle modifications including diet, exercise, and weight loss to help control his blood glucose levels.  Hypertension Max [name] is a 73 y.o. male with hypertension. Max [name]'s blood pressure is elevated, but he has taken all his blood pressure medications today. His previous blood pressure controlled. He denies chest pain, chest pressure, or headaches. He is working weight loss to help control his blood pressure with the goal of decreasing his risk of heart attack and stroke. Max [name]'s blood pressure is not currently controlled.  ASSESSMENT AND PLAN:  Type 2 diabetes mellitus with hyperglycemia, with long-term current use of insulin (HCC)  Essential hypertension - Plan: chlorthalidone (HYGROTON) 25 MG tablet  Class 2 severe obesity with serious comorbidity and body mass index (BMI) of 39.0 to 39.9 in adult, unspecified obesity type ([place])  PLAN:  Diabetes II with  Hyperglycemia Max [name] has been given extensive diabetes education by myself today including ideal fasting and post-prandial blood glucose readings, individual ideal Hgb A1c goals and hypoglycemia prevention. We discussed the importance of good blood sugar control to decrease the likelihood of diabetic complications such as nephropathy, neuropathy, limb loss, blindness, coronary artery disease, and death. We discussed the importance of intensive lifestyle modification including diet, exercise and weight loss as the first line treatment for diabetes. Max [name] agrees to continue [name], no refill needed, and he agrees to follow up with our clinic in 2 weeks.  Hypertension We discussed sodium restriction, working on healthy weight loss, and a regular exercise program as the means to achieve improved blood pressure control. Max [name] agreed with this plan and agreed to follow up as directed. We will continue to monitor his blood pressure as well as his progress with the above lifestyle modifications. Max [name] is to stop hydrochlorothiazide, and he agree to start chlorthalidone 25 mg PO daily #30 with no refills. He will watch for signs of hypotension as he continues his lifestyle modifications. We will recheck blood pressure at next visit. Max [name] agrees to follow up with our clinic in 2 weeks.  Obesity Max [name] is currently in the action stage of change. As such, his goal is to continue with weight loss efforts He has agreed to follow the Category 3 plan Max [name] has been instructed to work up to a goal of 150 minutes of combined cardio and strengthening exercise per week for weight loss and overall health benefits. We discussed the following Behavioral  Modification Strategies today: increasing lean protein intake, increasing vegetables, work on meal planning and easy cooking plans, and planning for success   Max [name] has agreed to follow up with our clinic in 2 weeks. He was informed of the importance of  frequent follow up visits to maximize his success with intensive lifestyle modifications for his multiple health conditions.  ALLERGIES: Allergies  Allergen Reactions  . Butane Anxiety and Other (See Comments)    Hallucinations REACTIONS ARE SIDE EFFECTS   . Gabapentin Other (See Comments)    Causes agression  . Hydromorphone Swelling    SWELLING REACTION UNSPECIFIED   . Tizanidine Swelling    SWELLING REACTION UNSPECIFIED   . Topamax [Topiramate] Other (See Comments)    Hallucinations   . Buprenorphine Other (See Comments)    UNSPECIFIED REACTION   . Pork-Derived Products     Pt. Refuses pork derived products  . Latex Rash    Localized rash    MEDICATIONS: Current Outpatient Medications on File Prior to Visit  Medication Sig Dispense Refill  . alfuzosin (UROXATRAL) 10 MG 24 hr tablet Take 10 mg by mouth daily with breakfast.    . amLODipine (NORVASC) 10 MG tablet Take 10 mg by mouth daily.     . Blood Glucose Monitoring Suppl (ONETOUCH VERIO) w/Device KIT 1 kit by Does not apply route daily at 12 noon. Continuous Glucose Monitoring Kit 1 kit 0  . Cholecalciferol (D3-1000 PO) Take 1 capsule by mouth daily.    . DULoxetine (CYMBALTA) 60 MG capsule Take 60 mg by mouth 2 (two) times daily.    . ferrous sulfate 325 (65 FE) MG EC tablet Take 325 mg by mouth daily with breakfast.    . Flaxseed Oil OIL 750 mg daily.    Marland Kitchen glucose blood (ACCU-CHEK AVIVA) test strip Use as instructed 100 each 0  . hydrOXYzine (VISTARIL) 25 MG capsule   1  . Ibuprofen (ADVIL) 200 MG CAPS Take 2 capsules by mouth daily.    . insulin regular (NOVOLIN R RELION) 250 units/2.13m (100 units/mL) injection INJECT 15 UNITS SUBCUTANEOUSLY THREE TIMES DAILY BEFORE MEAL(S) (Patient taking differently: INJECT 20 UNITS SUBCUTANEOUSLY with breakfast and lunch only) 20 mL 0  . Lancets (ACCU-CHEK SAFE-T PRO) lancets Use as instructed 100 each 0  . Levorphanol Tartrate 3 MG TABS Take 3 mg by mouth QID.    .Marland Kitchenlosartan  (COZAAR) 100 MG tablet Take 100 mg by mouth daily.    . Magnesium 400 MG TABS Take 1 tablet by mouth daily.    . naproxen sodium (ANAPROX) 220 MG tablet Take 220 mg by mouth daily as needed (for pain).     .Marland KitchenoxyCODONE (ROXICODONE) 15 MG immediate release tablet Take 10 mg by mouth 3 (three) times daily.     . Potassium 99 MG TABS Take 1 tablet by mouth daily.    . pravastatin (PRAVACHOL) 20 MG tablet Take 40 mg by mouth at bedtime.     . Probiotic Product (PROBIOTIC-10 PO) Take 1 capsule by mouth daily.    . tamsulosin (FLOMAX) 0.4 MG CAPS capsule Take 0.4 mg by mouth daily.     .Marland Kitchentopiramate (TOPAMAX) 25 MG tablet Take 25 mg by mouth 2 (two) times daily.    . [name] 1.5 MWF/0.9NASOPN     . vitamin B-12 (CYANOCOBALAMIN) 1000 MCG tablet Take 1,000 mcg by mouth daily.     No current facility-administered medications on file prior to visit.     PAST MEDICAL  HISTORY: Past Medical History:  Diagnosis Date  . ADD (attention deficit disorder)   . Anxiety   . Back pain   . Bipolar 1 disorder ([place])   . Chronic pain   . Constipation   . Decreased hearing   . Depression   . Diabetes mellitus without complication ([place])    Type II  . Dry skin   . DVT (deep venous thrombosis) ([place]) 2004   leg (not sure which leg)  . Dyslipidemia   . Dyspnea    with exertion  . Ear drainage   . Excessive hunger   . Excessive thirst   . Eye pain   . Fatigue   . Frequent urination   . Hard of hearing   . Hyperlipidemia   . Hypertension   . Itching   . Joint pain   . Lactose intolerance   . Leg cramps   . Mouth sores   . Nasal and sinus discharge   . Nausea   . Numbness and tingling    bilateral  . OSA (obstructive sleep apnea)    wears CPAP  . Shortness of breath   . Shortness of breath on exertion   . Sinus congestion   . Stiff neck   . Stress   . Swallowing difficulty   . Swelling of extremity   . Tinnitus   . Trouble in sleeping   . Urinary frequency   . Vision changes   .  Weakness   . Wears glasses     PAST SURGICAL HISTORY: Past Surgical History:  Procedure Laterality Date  . arm surgery Right   . BACK SURGERY  2004  . COLONOSCOPY    . ESOPHAGOGASTRODUODENOSCOPY    . EYE SURGERY Right    cataract  . SUBMANDIBULAR GLAND EXCISION Right 03/10/2016   Procedure: INTRAORAL  SUBMANDIBULAR  STONE REMOVAL;  Surgeon: Izora Gala, MD;  Location: [facility];  Service: ENT;  Laterality: Right;    SOCIAL HISTORY: Social History   Tobacco Use  . Smoking status: Never Smoker  . Smokeless tobacco: Never Used  Substance Use Topics  . Alcohol use: No  . Drug use: No    FAMILY HISTORY: Family History  Problem Relation Age of Onset  . Diabetes Mother   . Hypertension Mother   . Hyperlipidemia Mother   . Kidney disease Mother   . Cancer Mother   . Diabetes Brother     ROS: Review of Systems  Constitutional: Positive for weight loss.  Cardiovascular: Negative for chest pain.       Negative chest pressure  Neurological: Negative for headaches.  Endo/Heme/Allergies:       Negative hypoglycemia    PHYSICAL EXAM: Blood pressure (!) 169/71, pulse 83, temperature 98.1 F (36.7 C), temperature source Oral, height 5' 10"  (1.778 m), weight 277 lb (125.6 kg), SpO2 95 %. Body mass index is 39.75 kg/m. Physical Exam Vitals signs reviewed.  Constitutional:      Appearance: Normal appearance. He is obese.  Cardiovascular:     Rate and Rhythm: Normal rate.     Pulses: Normal pulses.  Pulmonary:     Effort: Pulmonary effort is normal.     Breath sounds: Normal breath sounds.  Musculoskeletal: Normal range of motion.  Skin:    General: Skin is warm and dry.  Neurological:     Mental Status: He is alert and oriented to person, place, and time.  Psychiatric:        Mood and Affect: Mood  normal.        Behavior: Behavior normal.     RECENT LABS AND TESTS: BMET    Component Value Date/Time   NA 139 09/28/2017 1145   K 4.0 09/28/2017 1145   CL 100  09/28/2017 1145   CO2 24 09/28/2017 1145   GLUCOSE 127 (H) 09/28/2017 1145   GLUCOSE 208 (H) 03/09/2016 1424   BUN 15 09/28/2017 1145   CREATININE 0.91 09/28/2017 1145   CALCIUM 9.4 09/28/2017 1145   GFRNONAA 86 09/28/2017 1145   GFRAA 99 09/28/2017 1145   Lab Results  Component Value Date   HGBA1C 8.3 (H) 09/28/2017   HGBA1C 7.2 (H) 03/09/2016   HGBA1C 7.9 08/12/2015   No results found for: INSULIN CBC    Component Value Date/Time   WBC 10.8 09/28/2017 1145   WBC 11.6 (H) 03/09/2016 1424   RBC 5.32 09/28/2017 1145   RBC 5.17 03/09/2016 1424   HGB 13.8 09/28/2017 1145   HCT 42.3 09/28/2017 1145   PLT 291 03/09/2016 1424   MCV 80 09/28/2017 1145   MCH 25.9 (L) 09/28/2017 1145   MCH 26.7 03/09/2016 1424   MCHC 32.6 09/28/2017 1145   MCHC 33.3 03/09/2016 1424   RDW 14.6 09/28/2017 1145   LYMPHSABS 1.7 09/28/2017 1145   EOSABS 0.1 09/28/2017 1145   BASOSABS 0.0 09/28/2017 1145   Iron/TIBC/Ferritin/ %Sat No results found for: IRON, TIBC, FERRITIN, IRONPCTSAT Lipid Panel     Component Value Date/Time   CHOL 206 (H) 09/28/2017 1145   TRIG 60 09/28/2017 1145   HDL 70 09/28/2017 1145   CHOLHDL 3.8 Ratio 01/09/2008 2228   VLDL 29 01/09/2008 2228   LDLCALC 124 (H) 09/28/2017 1145   Hepatic Function Panel     Component Value Date/Time   PROT 6.7 09/28/2017 1145   ALBUMIN 4.4 09/28/2017 1145   AST 22 09/28/2017 1145   ALT 27 09/28/2017 1145   ALKPHOS 106 09/28/2017 1145   BILITOT 0.4 09/28/2017 1145      Component Value Date/Time   TSH 0.471 09/28/2017 1145   TSH 0.499 02/15/2008 2052   TSH 0.106 (L) 01/09/2008 2228      OBESITY BEHAVIORAL INTERVENTION VISIT  Today's visit was # 12   Starting weight: 273 lbs Starting date: 09/28/17 Today's weight : 277 lbs Today's date: 04/06/2018 Total lbs lost to date: 0 At least 15 minutes were spent on discussing the following behavioral intervention visit.   ASK: We discussed the diagnosis of obesity with [name] today and Kristofor agreed to give [name] permission to discuss obesity behavioral modification therapy today.  ASSESS: Yarden has the diagnosis of obesity and his BMI today is 39.75 Joby is in the action stage of change   ADVISE: Teja was educated on the multiple health risks of obesity as well as the benefit of weight loss to improve his health. He was advised of the need for long term treatment and the importance of lifestyle modifications to improve his current health and to decrease his risk of future health problems.  AGREE: Multiple dietary modification options and treatment options were discussed and  Ishmail agreed to follow the recommendations documented in the above note.  ARRANGE: Tremayne was educated on the importance of frequent visits to treat obesity as outlined per CMS and USPSTF guidelines and agreed to schedule his next follow up appointment today.  I, Trixie Dredge, am acting as transcriptionist for Ilene Qua, MD  I have reviewed the above documentation for accuracy  and completeness, and I agree with the above. - Ilene Qua, MD

## 2018-04-26 ENCOUNTER — Encounter (INDEPENDENT_AMBULATORY_CARE_PROVIDER_SITE_OTHER): Payer: Self-pay

## 2018-04-26 ENCOUNTER — Encounter (INDEPENDENT_AMBULATORY_CARE_PROVIDER_SITE_OTHER): Payer: Self-pay | Admitting: Family Medicine

## 2018-04-26 ENCOUNTER — Ambulatory Visit (INDEPENDENT_AMBULATORY_CARE_PROVIDER_SITE_OTHER): Payer: Medicare Other | Admitting: Family Medicine

## 2018-04-26 VITALS — BP 126/70 | HR 93 | Temp 97.8°F | Ht 70.0 in | Wt 271.0 lb

## 2018-04-26 DIAGNOSIS — Z794 Long term (current) use of insulin: Secondary | ICD-10-CM

## 2018-04-26 DIAGNOSIS — E119 Type 2 diabetes mellitus without complications: Secondary | ICD-10-CM

## 2018-04-26 DIAGNOSIS — Z6838 Body mass index (BMI) 38.0-38.9, adult: Secondary | ICD-10-CM

## 2018-04-27 ENCOUNTER — Encounter (INDEPENDENT_AMBULATORY_CARE_PROVIDER_SITE_OTHER): Payer: Self-pay | Admitting: Family Medicine

## 2018-04-27 NOTE — Progress Notes (Signed)
Office: [phone]  /  Fax: [phone]   HPI:   Chief Complaint: OBESITY Max [name] is here to discuss his progress with his obesity treatment plan. He is on the Category 3 plan and is following his eating plan approximately 70% of the time. He states he is walking for 30 minutes 4 times per week. Max [name] has chronic lumbar back pain and wants to lose weight to decrease the pain. He is struggling with gas when he eats dairy.  His weight is 271 lb (122.9 kg) today and has had a weight loss of 6 pounds over a period of 3 weeks since his last visit. He has lost 2 lbs since starting treatment with [name].  Diabetes II Max [name] has a diagnosis of diabetes type II, not well controlled. Last A1c was 8.3 on 09/28/17. He was unable to afford [name] at the end of 2019 because he was in a the "donut hole". He started back on [name] a month ago which he has decreased his appetite. He states fasting BGs range between 99 and 149, and post prandials range between 140 and 208.  He denies hypoglycemia. He sees endocrinology with Dr. Georgann Housekeeper and will follow up in February. He has been working on intensive lifestyle modifications including diet, exercise, and weight loss to help control his blood glucose levels.  ALLERGIES: Allergies  Allergen Reactions  . Butane Anxiety and Other (See Comments)    Hallucinations REACTIONS ARE SIDE EFFECTS   . Gabapentin Other (See Comments)    Causes agression  . Hydromorphone Swelling    SWELLING REACTION UNSPECIFIED   . Tizanidine Swelling    SWELLING REACTION UNSPECIFIED   . Topamax [Topiramate] Other (See Comments)    Hallucinations   . Buprenorphine Other (See Comments)    UNSPECIFIED REACTION   . Pork-Derived Products     Pt. Refuses pork derived products  . Latex Rash    Localized rash    MEDICATIONS: Current Outpatient Medications on File Prior to Visit  Medication Sig Dispense Refill  . alfuzosin (UROXATRAL) 10 MG 24 hr tablet Take 10 mg by mouth  daily with breakfast.    . amLODipine (NORVASC) 10 MG tablet Take 10 mg by mouth daily.     . Blood Glucose Monitoring Suppl (ONETOUCH VERIO) w/Device KIT 1 kit by Does not apply route daily at 12 noon. Continuous Glucose Monitoring Kit 1 kit 0  . chlorthalidone (HYGROTON) 25 MG tablet Take 1 tablet (25 mg total) by mouth daily. 30 tablet 0  . Cholecalciferol (D3-1000 PO) Take 1 capsule by mouth daily.    . DULoxetine (CYMBALTA) 60 MG capsule Take 60 mg by mouth 2 (two) times daily.    . ferrous sulfate 325 (65 FE) MG EC tablet Take 325 mg by mouth daily with breakfast.    . Flaxseed Oil OIL 750 mg daily.    Marland Kitchen glucose blood (ACCU-CHEK AVIVA) test strip Use as instructed 100 each 0  . hydrOXYzine (VISTARIL) 25 MG capsule   1  . Ibuprofen (ADVIL) 200 MG CAPS Take 2 capsules by mouth daily.    . insulin regular (NOVOLIN R RELION) 250 units/2.54m (100 units/mL) injection INJECT 15 UNITS SUBCUTANEOUSLY THREE TIMES DAILY BEFORE MEAL(S) (Patient taking differently: INJECT 20 UNITS SUBCUTANEOUSLY with breakfast and lunch only) 20 mL 0  . Lancets (ACCU-CHEK SAFE-T PRO) lancets Use as instructed 100 each 0  . Levorphanol Tartrate 3 MG TABS Take 3 mg by mouth QID.    .Marland Kitchenlosartan (COZAAR) 100 MG tablet  Take 100 mg by mouth daily.    . Magnesium 400 MG TABS Take 1 tablet by mouth daily.    . naproxen sodium (ANAPROX) 220 MG tablet Take 220 mg by mouth daily as needed (for pain).     Marland Kitchen oxyCODONE (ROXICODONE) 15 MG immediate release tablet Take 10 mg by mouth 3 (three) times daily.     . Potassium 99 MG TABS Take 1 tablet by mouth daily.    . pravastatin (PRAVACHOL) 20 MG tablet Take 40 mg by mouth at bedtime.     . Probiotic Product (PROBIOTIC-10 PO) Take 1 capsule by mouth daily.    . tamsulosin (FLOMAX) 0.4 MG CAPS capsule Take 0.4 mg by mouth daily.     Marland Kitchen topiramate (TOPAMAX) 25 MG tablet Take 25 mg by mouth 2 (two) times daily.    . [name] 1.5 XF/8.1WE SOPN     . vitamin B-12 (CYANOCOBALAMIN) 1000  MCG tablet Take 1,000 mcg by mouth daily.     No current facility-administered medications on file prior to visit.     PAST MEDICAL HISTORY: Past Medical History:  Diagnosis Date  . ADD (attention deficit disorder)   . Anxiety   . Back pain   . Bipolar 1 disorder ([place])   . Chronic pain   . Constipation   . Decreased hearing   . Depression   . Diabetes mellitus without complication ([place])    Type II  . Dry skin   . DVT (deep venous thrombosis) ([place]) 2004   leg (not sure which leg)  . Dyslipidemia   . Dyspnea    with exertion  . Ear drainage   . Excessive hunger   . Excessive thirst   . Eye pain   . Fatigue   . Frequent urination   . Hard of hearing   . Hyperlipidemia   . Hypertension   . Itching   . Joint pain   . Lactose intolerance   . Leg cramps   . Mouth sores   . Nasal and sinus discharge   . Nausea   . Numbness and tingling    bilateral  . OSA (obstructive sleep apnea)    wears CPAP  . Shortness of breath   . Shortness of breath on exertion   . Sinus congestion   . Stiff neck   . Stress   . Swallowing difficulty   . Swelling of extremity   . Tinnitus   . Trouble in sleeping   . Urinary frequency   . Vision changes   . Weakness   . Wears glasses     PAST SURGICAL HISTORY: Past Surgical History:  Procedure Laterality Date  . arm surgery Right   . BACK SURGERY  2004  . COLONOSCOPY    . ESOPHAGOGASTRODUODENOSCOPY    . EYE SURGERY Right    cataract  . SUBMANDIBULAR GLAND EXCISION Right 03/10/2016   Procedure: INTRAORAL  SUBMANDIBULAR  STONE REMOVAL;  Surgeon: Izora Gala, MD;  Location: [facility];  Service: ENT;  Laterality: Right;    SOCIAL HISTORY: Social History   Tobacco Use  . Smoking status: Never Smoker  . Smokeless tobacco: Never Used  Substance Use Topics  . Alcohol use: No  . Drug use: No    FAMILY HISTORY: Family History  Problem Relation Age of Onset  . Diabetes Mother   . Hypertension Mother   . Hyperlipidemia Mother   .  Kidney disease Mother   . Cancer Mother   . Diabetes  Brother     ROS: Review of Systems  Constitutional: Positive for weight loss.  Endo/Heme/Allergies:       Negative hypoglycemia    PHYSICAL EXAM: Blood pressure 126/70, pulse 93, temperature 97.8 F (36.6 C), temperature source Oral, height 5' 10"  (1.778 m), weight 271 lb (122.9 kg), SpO2 96 %. Body mass index is 38.88 kg/m. Physical Exam Vitals signs reviewed.  Constitutional:      Appearance: Normal appearance. He is obese.  Cardiovascular:     Rate and Rhythm: Normal rate.     Pulses: Normal pulses.  Pulmonary:     Effort: Pulmonary effort is normal.     Breath sounds: Normal breath sounds.  Musculoskeletal: Normal range of motion.  Skin:    General: Skin is warm and dry.  Neurological:     Mental Status: He is alert and oriented to person, place, and time.  Psychiatric:        Mood and Affect: Mood normal.        Behavior: Behavior normal.     RECENT LABS AND TESTS: BMET    Component Value Date/Time   NA 139 09/28/2017 1145   K 4.0 09/28/2017 1145   CL 100 09/28/2017 1145   CO2 24 09/28/2017 1145   GLUCOSE 127 (H) 09/28/2017 1145   GLUCOSE 208 (H) 03/09/2016 1424   BUN 15 09/28/2017 1145   CREATININE 0.91 09/28/2017 1145   CALCIUM 9.4 09/28/2017 1145   GFRNONAA 86 09/28/2017 1145   GFRAA 99 09/28/2017 1145   Lab Results  Component Value Date   HGBA1C 8.3 (H) 09/28/2017   HGBA1C 7.2 (H) 03/09/2016   HGBA1C 7.9 08/12/2015   No results found for: INSULIN CBC    Component Value Date/Time   WBC 10.8 09/28/2017 1145   WBC 11.6 (H) 03/09/2016 1424   RBC 5.32 09/28/2017 1145   RBC 5.17 03/09/2016 1424   HGB 13.8 09/28/2017 1145   HCT 42.3 09/28/2017 1145   PLT 291 03/09/2016 1424   MCV 80 09/28/2017 1145   MCH 25.9 (L) 09/28/2017 1145   MCH 26.7 03/09/2016 1424   MCHC 32.6 09/28/2017 1145   MCHC 33.3 03/09/2016 1424   RDW 14.6 09/28/2017 1145   LYMPHSABS 1.7 09/28/2017 1145   EOSABS 0.1  09/28/2017 1145   BASOSABS 0.0 09/28/2017 1145   Iron/TIBC/Ferritin/ %Sat No results found for: IRON, TIBC, FERRITIN, IRONPCTSAT Lipid Panel     Component Value Date/Time   CHOL 206 (H) 09/28/2017 1145   TRIG 60 09/28/2017 1145   HDL 70 09/28/2017 1145   CHOLHDL 3.8 Ratio 01/09/2008 2228   VLDL 29 01/09/2008 2228   LDLCALC 124 (H) 09/28/2017 1145   Hepatic Function Panel     Component Value Date/Time   PROT 6.7 09/28/2017 1145   ALBUMIN 4.4 09/28/2017 1145   AST 22 09/28/2017 1145   ALT 27 09/28/2017 1145   ALKPHOS 106 09/28/2017 1145   BILITOT 0.4 09/28/2017 1145      Component Value Date/Time   TSH 0.471 09/28/2017 1145   TSH 0.499 02/15/2008 2052   TSH 0.106 (L) 01/09/2008 2228    ASSESSMENT AND PLAN: Type 2 diabetes mellitus without complication, with long-term current use of insulin (HCC)  Class 2 severe obesity with serious comorbidity and body mass index (BMI) of 38.0 to 38.9 in adult, unspecified obesity type ([place])  PLAN:  Diabetes II Max [name] has been given extensive diabetes education by myself today including ideal fasting and post-prandial blood glucose readings, individual ideal  Hgb A1c goals and hypoglycemia prevention. We discussed the importance of good blood sugar control to decrease the likelihood of diabetic complications such as nephropathy, neuropathy, limb loss, blindness, coronary artery disease, and death. We discussed the importance of intensive lifestyle modification including diet, exercise and weight loss as the first line treatment for diabetes. Max [name] agrees to continue his diabetes medications and will continue his meal plan. Max [name] agrees to follow up with our clinic in 2 weeks.  Obesity Max [name] is currently in the action stage of change. As such, his goal is to continue with weight loss efforts He has agreed to follow the Category 3 plan Max [name] will continue current exercise regimen for weight loss and overall health benefits. We  discussed the following Behavioral Modification Strategies today: increasing lean protein intake, increase H20 intake, celebration eating strategies, and planning for success  We discussed substitutions for milk in the meal plan.  Max [name] has agreed to follow up with our clinic in 2 weeks. He was informed of the importance of frequent follow up visits to maximize his success with intensive lifestyle modifications for his multiple health conditions.   OBESITY BEHAVIORAL INTERVENTION VISIT  Today's visit was # 13  Starting weight: 273 lbs Starting date: 09/28/17 Today's weight : 271 lbs  Today's date: 04/26/2018 Total lbs lost to date: 2 At least 15 minutes were spent on discussing the following behavioral intervention visit.   ASK: We discussed the diagnosis of obesity with Max [name] today and Max [name] agreed to give [name] permission to discuss obesity behavioral modification therapy today.  ASSESS: Max [name] has the diagnosis of obesity and his BMI today is 38.88 Max [name] is in the action stage of change   ADVISE: Max [name] was educated on the multiple health risks of obesity as well as the benefit of weight loss to improve his health. He was advised of the need for long term treatment and the importance of lifestyle modifications to improve his current health and to decrease his risk of future health problems.  AGREE: Multiple dietary modification options and treatment options were discussed and  Max [name] agreed to follow the recommendations documented in the above note.  ARRANGE: Max [name] was educated on the importance of frequent visits to treat obesity as outlined per CMS and USPSTF guidelines and agreed to schedule his next follow up appointment today.  Wilhemena Durie, am acting as Location manager for [employer], FNP-C.  I have reviewed the above documentation for accuracy and completeness, and I agree with the above.  -  , FNP-C.

## 2018-05-15 ENCOUNTER — Encounter (INDEPENDENT_AMBULATORY_CARE_PROVIDER_SITE_OTHER): Payer: Self-pay | Admitting: Family Medicine

## 2018-05-15 ENCOUNTER — Ambulatory Visit (INDEPENDENT_AMBULATORY_CARE_PROVIDER_SITE_OTHER): Payer: Medicare Other | Admitting: Family Medicine

## 2018-05-15 ENCOUNTER — Other Ambulatory Visit (INDEPENDENT_AMBULATORY_CARE_PROVIDER_SITE_OTHER): Payer: Self-pay | Admitting: Family Medicine

## 2018-05-15 VITALS — BP 129/69 | HR 83 | Temp 97.9°F | Ht 70.0 in | Wt 267.0 lb

## 2018-05-15 DIAGNOSIS — I10 Essential (primary) hypertension: Secondary | ICD-10-CM | POA: Diagnosis not present

## 2018-05-15 DIAGNOSIS — E1165 Type 2 diabetes mellitus with hyperglycemia: Secondary | ICD-10-CM | POA: Diagnosis not present

## 2018-05-15 DIAGNOSIS — Z6838 Body mass index (BMI) 38.0-38.9, adult: Secondary | ICD-10-CM

## 2018-05-15 DIAGNOSIS — Z794 Long term (current) use of insulin: Secondary | ICD-10-CM | POA: Diagnosis not present

## 2018-05-15 MED ORDER — CHLORTHALIDONE 25 MG PO TABS: 25.0000 mg | ORAL_TABLET | Freq: Every day | ORAL | 0 refills | Status: DC

## 2018-05-15 NOTE — Progress Notes (Signed)
Office: [phone]  /  Fax: [phone]   HPI:   Chief Complaint: OBESITY Max [name] is here to discuss his progress with his obesity treatment plan. He is on the Category 3 plan and is following his eating plan approximately 70 % of the time. He states he is exercising 0 minutes 0 times per week. Hannah had a good last 2 weeks and did eat some indulgent foods, but in moderation.  His weight is 267 lb (121.1 kg) today and has had a weight loss of 4 pounds over a period of 3 weeks since his last visit. He has lost 6 lbs since starting treatment with [name].  Hypertension Max [name] is a 73 y.o. male with hypertension. Giovany's blood pressure is controlled. He denies chest pain, chest pressure, or headaches. He is working on weight loss to help control his blood pressure with the goal of decreasing his risk of heart attack and stroke.   Diabetes II with Hyperglycemia Max [name] has a diagnosis of diabetes type II. Max [name] states fasting BGs range in 120's (no glucometer today). He is not regimented in dosing insulin at specific times giving 20 units BID (normally skipping night time dose). Last A1c was 8.3. on 09/28/17. He has been working on intensive lifestyle modifications including diet, exercise, and weight loss to help control his blood glucose levels.  ASSESSMENT AND PLAN:  Essential hypertension - Plan: Lipid Panel With LDL/HDL Ratio, chlorthalidone (HYGROTON) 25 MG tablet  Type 2 diabetes mellitus with hyperglycemia, with long-term current use of insulin (HCC) - Plan: C-peptide, Lipid Panel With LDL/HDL Ratio, Hemoglobin A1c, Comprehensive metabolic panel  Class 2 severe obesity with serious comorbidity and body mass index (BMI) of 38.0 to 38.9 in adult, unspecified obesity type ([place])  PLAN:  Hypertension We discussed sodium restriction, working on healthy weight loss, and a regular exercise program as the means to achieve improved blood pressure control. Max [name] agreed with  this plan and agreed to follow up as directed. We will continue to monitor his blood pressure as well as his progress with the above lifestyle modifications. Max [name] will continue his current medications and will watch for signs of hypotension as he continues his lifestyle modifications. Max [name] agrees to continue chlorthalidone 25 mg PO daily #30 and we will refill for 1 month. We will check CMP and FLP today. Max [name] agrees to follow up with our clinic in 2 weeks.  Diabetes II with Hyperglycemia Max [name] has been given extensive diabetes education by myself today including ideal fasting and post-prandial blood glucose readings, individual ideal Hgb A1c goals and discussed hypoglycemia prevention again today. We discussed the importance of good blood sugar control to decrease the likelihood of diabetic complications such as nephropathy, neuropathy, limb loss, blindness, coronary artery disease, and death. We discussed the importance of intensive lifestyle modification including diet, exercise and weight loss as the first line treatment for diabetes. Max [name] agrees to continue his diabetes medications, no change in insulin, and he is to bring in glucometer at next appointment. We will check Hgb A1c and C-peptide today. Jakye agrees follow up with our clinic in 2 weeks.  Obesity Max [name] is currently in the action stage of change. As such, his goal is to continue with weight loss efforts He has agreed to follow the Category 4 plan Max [name] has been instructed to work up to a goal of 150 minutes of combined cardio and strengthening exercise per week or start swimming 2 times per week for weight loss and overall health benefits.  We discussed the following Behavioral Modification Strategies today: increasing lean protein intake, increasing vegetables and work on meal planning and easy cooking plans, and planning for success   Max [name] has agreed to follow up with our clinic in 2 weeks. He was informed of the  importance of frequent follow up visits to maximize his success with intensive lifestyle modifications for his multiple health conditions.  ALLERGIES: Allergies  Allergen Reactions  . Butane Anxiety and Other (See Comments)    Hallucinations REACTIONS ARE SIDE EFFECTS   . Gabapentin Other (See Comments)    Causes agression  . Hydromorphone Swelling    SWELLING REACTION UNSPECIFIED   . Tizanidine Swelling    SWELLING REACTION UNSPECIFIED   . Topamax [Topiramate] Other (See Comments)    Hallucinations   . Buprenorphine Other (See Comments)    UNSPECIFIED REACTION   . Pork-Derived Products     Pt. Refuses pork derived products  . Latex Rash    Localized rash    MEDICATIONS: Current Outpatient Medications on File Prior to Visit  Medication Sig Dispense Refill  . alfuzosin (UROXATRAL) 10 MG 24 hr tablet Take 10 mg by mouth daily with breakfast.    . amLODipine (NORVASC) 10 MG tablet Take 10 mg by mouth daily.     . Blood Glucose Monitoring Suppl (ONETOUCH VERIO) w/Device KIT 1 kit by Does not apply route daily at 12 noon. Continuous Glucose Monitoring Kit 1 kit 0  . Cholecalciferol (D3-1000 PO) Take 1 capsule by mouth daily.    . DULoxetine (CYMBALTA) 60 MG capsule Take 60 mg by mouth 2 (two) times daily.    . ferrous sulfate 325 (65 FE) MG EC tablet Take 325 mg by mouth daily with breakfast.    . Flaxseed Oil OIL 750 mg daily.    Marland Kitchen glucose blood (ACCU-CHEK AVIVA) test strip Use as instructed 100 each 0  . hydrOXYzine (VISTARIL) 25 MG capsule   1  . Ibuprofen (ADVIL) 200 MG CAPS Take 2 capsules by mouth daily.    . insulin regular (NOVOLIN R RELION) 250 units/2.45m (100 units/mL) injection INJECT 15 UNITS SUBCUTANEOUSLY THREE TIMES DAILY BEFORE MEAL(S) (Patient taking differently: INJECT 20 UNITS SUBCUTANEOUSLY with breakfast and lunch only) 20 mL 0  . Lancets (ACCU-CHEK SAFE-T PRO) lancets Use as instructed 100 each 0  . Levorphanol Tartrate 3 MG TABS Take 3 mg by mouth QID.    .Marland Kitchen losartan (COZAAR) 100 MG tablet Take 100 mg by mouth daily.    . Magnesium 400 MG TABS Take 1 tablet by mouth daily.    . naproxen sodium (ANAPROX) 220 MG tablet Take 220 mg by mouth daily as needed (for pain).     .Marland KitchenoxyCODONE (ROXICODONE) 15 MG immediate release tablet Take 10 mg by mouth 3 (three) times daily.     . Potassium 99 MG TABS Take 1 tablet by mouth daily.    . pravastatin (PRAVACHOL) 20 MG tablet Take 40 mg by mouth at bedtime.     . Probiotic Product (PROBIOTIC-10 PO) Take 1 capsule by mouth daily.    . tamsulosin (FLOMAX) 0.4 MG CAPS capsule Take 0.4 mg by mouth daily.     .Marland Kitchentopiramate (TOPAMAX) 25 MG tablet Take 25 mg by mouth 2 (two) times daily.    . [name] 1.5 MTZ/0.0FVSOPN     . vitamin B-12 (CYANOCOBALAMIN) 1000 MCG tablet Take 1,000 mcg by mouth daily.     No current facility-administered medications on file prior to visit.  PAST MEDICAL HISTORY: Past Medical History:  Diagnosis Date  . ADD (attention deficit disorder)   . Anxiety   . Back pain   . Bipolar 1 disorder ([place])   . Chronic pain   . Constipation   . Decreased hearing   . Depression   . Diabetes mellitus without complication ([place])    Type II  . Dry skin   . DVT (deep venous thrombosis) ([place]) 2004   leg (not sure which leg)  . Dyslipidemia   . Dyspnea    with exertion  . Ear drainage   . Excessive hunger   . Excessive thirst   . Eye pain   . Fatigue   . Frequent urination   . Hard of hearing   . Hyperlipidemia   . Hypertension   . Itching   . Joint pain   . Lactose intolerance   . Leg cramps   . Mouth sores   . Nasal and sinus discharge   . Nausea   . Numbness and tingling    bilateral  . OSA (obstructive sleep apnea)    wears CPAP  . Shortness of breath   . Shortness of breath on exertion   . Sinus congestion   . Stiff neck   . Stress   . Swallowing difficulty   . Swelling of extremity   . Tinnitus   . Trouble in sleeping   . Urinary frequency   . Vision changes     . Weakness   . Wears glasses     PAST SURGICAL HISTORY: Past Surgical History:  Procedure Laterality Date  . arm surgery Right   . BACK SURGERY  2004  . COLONOSCOPY    . ESOPHAGOGASTRODUODENOSCOPY    . EYE SURGERY Right    cataract  . SUBMANDIBULAR GLAND EXCISION Right 03/10/2016   Procedure: INTRAORAL  SUBMANDIBULAR  STONE REMOVAL;  Surgeon: Izora Gala, MD;  Location: [facility];  Service: ENT;  Laterality: Right;    SOCIAL HISTORY: Social History   Tobacco Use  . Smoking status: Never Smoker  . Smokeless tobacco: Never Used  Substance Use Topics  . Alcohol use: No  . Drug use: No    FAMILY HISTORY: Family History  Problem Relation Age of Onset  . Diabetes Mother   . Hypertension Mother   . Hyperlipidemia Mother   . Kidney disease Mother   . Cancer Mother   . Diabetes Brother     ROS: Review of Systems  Constitutional: Positive for weight loss.  Cardiovascular: Negative for chest pain.       Negative chest pressure  Neurological: Negative for headaches.  Endo/Heme/Allergies:       Negative hypoglycemia    PHYSICAL EXAM: Blood pressure 129/69, pulse 83, temperature 97.9 F (36.6 C), temperature source Oral, height 5' 10"  (1.778 m), weight 267 lb (121.1 kg), SpO2 95 %. Body mass index is 38.31 kg/m. Physical Exam Vitals signs reviewed.  Constitutional:      Appearance: Normal appearance. He is obese.  Cardiovascular:     Rate and Rhythm: Normal rate.     Pulses: Normal pulses.  Pulmonary:     Effort: Pulmonary effort is normal.     Breath sounds: Normal breath sounds.  Musculoskeletal: Normal range of motion.  Skin:    General: Skin is warm and dry.  Neurological:     Mental Status: He is alert and oriented to person, place, and time.  Psychiatric:        Mood and  Affect: Mood normal.        Behavior: Behavior normal.     RECENT LABS AND TESTS: BMET    Component Value Date/Time   NA 139 09/28/2017 1145   K 4.0 09/28/2017 1145   CL 100  09/28/2017 1145   CO2 24 09/28/2017 1145   GLUCOSE 127 (H) 09/28/2017 1145   GLUCOSE 208 (H) 03/09/2016 1424   BUN 15 09/28/2017 1145   CREATININE 0.91 09/28/2017 1145   CALCIUM 9.4 09/28/2017 1145   GFRNONAA 86 09/28/2017 1145   GFRAA 99 09/28/2017 1145   Lab Results  Component Value Date   HGBA1C 8.3 (H) 09/28/2017   HGBA1C 7.2 (H) 03/09/2016   HGBA1C 7.9 08/12/2015   No results found for: INSULIN CBC    Component Value Date/Time   WBC 10.8 09/28/2017 1145   WBC 11.6 (H) 03/09/2016 1424   RBC 5.32 09/28/2017 1145   RBC 5.17 03/09/2016 1424   HGB 13.8 09/28/2017 1145   HCT 42.3 09/28/2017 1145   PLT 291 03/09/2016 1424   MCV 80 09/28/2017 1145   MCH 25.9 (L) 09/28/2017 1145   MCH 26.7 03/09/2016 1424   MCHC 32.6 09/28/2017 1145   MCHC 33.3 03/09/2016 1424   RDW 14.6 09/28/2017 1145   LYMPHSABS 1.7 09/28/2017 1145   EOSABS 0.1 09/28/2017 1145   BASOSABS 0.0 09/28/2017 1145   Iron/TIBC/Ferritin/ %Sat No results found for: IRON, TIBC, FERRITIN, IRONPCTSAT Lipid Panel     Component Value Date/Time   CHOL 206 (H) 09/28/2017 1145   TRIG 60 09/28/2017 1145   HDL 70 09/28/2017 1145   CHOLHDL 3.8 Ratio 01/09/2008 2228   VLDL 29 01/09/2008 2228   LDLCALC 124 (H) 09/28/2017 1145   Hepatic Function Panel     Component Value Date/Time   PROT 6.7 09/28/2017 1145   ALBUMIN 4.4 09/28/2017 1145   AST 22 09/28/2017 1145   ALT 27 09/28/2017 1145   ALKPHOS 106 09/28/2017 1145   BILITOT 0.4 09/28/2017 1145      Component Value Date/Time   TSH 0.471 09/28/2017 1145   TSH 0.499 02/15/2008 2052   TSH 0.106 (L) 01/09/2008 2228      OBESITY BEHAVIORAL INTERVENTION VISIT  Today's visit was # 14   Starting weight: 273 lbs Starting date: 09/28/17 Today's weight : 267 lbs  Today's date: 05/15/2018 Total lbs lost to date: 6 At least 15 minutes were spent on discussing the following behavioral intervention visit.   ASK: We discussed the diagnosis of obesity with [name] today and Ory agreed to give [name] permission to discuss obesity behavioral modification therapy today.  ASSESS: Zyere has the diagnosis of obesity and his BMI today is 38.31 [place] is in the action stage of change   ADVISE: Odean was educated on the multiple health risks of obesity as well as the benefit of weight loss to improve his health. He was advised of the need for long term treatment and the importance of lifestyle modifications to improve his current health and to decrease his risk of future health problems.  AGREE: Multiple dietary modification options and treatment options were discussed and  Senon agreed to follow the recommendations documented in the above note.  ARRANGE: Travious was educated on the importance of frequent visits to treat obesity as outlined per CMS and USPSTF guidelines and agreed to schedule his next follow up appointment today.  Wilhemena Durie, am acting as transcriptionist for Ilene Qua, MD  I have reviewed the above  documentation for accuracy and completeness, and I agree with the above. - Ilene Qua, MD

## 2018-05-16 LAB — COMPREHENSIVE METABOLIC PANEL
ALT: 17 IU/L (ref 0–44)
AST: 16 IU/L (ref 0–40)
Albumin/Globulin Ratio: 1.8 (ref 1.2–2.2)
Albumin: 4.2 g/dL (ref 3.8–4.8)
Alkaline Phosphatase: 94 IU/L (ref 39–117)
BUN/Creatinine Ratio: 15 (ref 10–24)
BUN: 14 mg/dL (ref 8–27)
Bilirubin Total: 0.2 mg/dL (ref 0.0–1.2)
CO2: 26 mmol/L (ref 20–29)
Calcium: 9.2 mg/dL (ref 8.6–10.2)
Chloride: 98 mmol/L (ref 96–106)
Creatinine, Ser: 0.95 mg/dL (ref 0.76–1.27)
GFR calc Af Amer: 93 mL/min/{1.73_m2} (ref >59)
GFR calc non Af Amer: 81 mL/min/{1.73_m2} (ref >59)
Globulin, Total: 2.4 g/dL (ref 1.5–4.5)
Glucose: 173 mg/dL — ABNORMAL HIGH (ref 65–99)
Potassium: 4.1 mmol/L (ref 3.5–5.2)
Sodium: 139 mmol/L (ref 134–144)
Total Protein: 6.6 g/dL (ref 6.0–8.5)

## 2018-05-16 LAB — LIPID PANEL WITH LDL/HDL RATIO
Cholesterol, Total: 185 mg/dL (ref 100–199)
HDL: 45 mg/dL (ref >39)
LDL Calculated: 120 mg/dL — ABNORMAL HIGH (ref 0–99)
LDl/HDL Ratio: 2.7 ratio (ref 0.0–3.6)
Triglycerides: 99 mg/dL (ref 0–149)
VLDL Cholesterol Cal: 20 mg/dL (ref 5–40)

## 2018-05-16 LAB — HEMOGLOBIN A1C
Est. average glucose Bld gHb Est-mCnc: 174 mg/dL
Hgb A1c MFr Bld: 7.7 % — ABNORMAL HIGH (ref 4.8–5.6)

## 2018-05-16 LAB — C-PEPTIDE: C-Peptide: 2.1 ng/mL (ref 1.1–4.4)

## 2018-05-29 ENCOUNTER — Ambulatory Visit (INDEPENDENT_AMBULATORY_CARE_PROVIDER_SITE_OTHER): Payer: Medicare Other | Admitting: Family Medicine

## 2018-05-29 ENCOUNTER — Encounter (INDEPENDENT_AMBULATORY_CARE_PROVIDER_SITE_OTHER): Payer: Self-pay | Admitting: Family Medicine

## 2018-05-29 VITALS — BP 134/65 | HR 89 | Temp 97.8°F | Ht 70.0 in | Wt 268.0 lb

## 2018-05-29 DIAGNOSIS — I10 Essential (primary) hypertension: Secondary | ICD-10-CM

## 2018-05-29 DIAGNOSIS — E1165 Type 2 diabetes mellitus with hyperglycemia: Secondary | ICD-10-CM | POA: Diagnosis not present

## 2018-05-29 DIAGNOSIS — Z6838 Body mass index (BMI) 38.0-38.9, adult: Secondary | ICD-10-CM | POA: Diagnosis not present

## 2018-05-29 MED ORDER — CHLORTHALIDONE 25 MG PO TABS: 25.0000 mg | ORAL_TABLET | Freq: Every day | ORAL | 0 refills | Status: DC

## 2018-05-29 NOTE — Progress Notes (Signed)
Office: [phone]  /  Fax: [phone]   HPI:   Chief Complaint: OBESITY Max [name] is here to discuss his progress with his obesity treatment plan. He is on the Category 4 plan and is following his eating plan approximately 50 % of the time. He states he is walking for 30 minutes 4-5 times per week. Max [name] had a rough few weeks with life stressors. His car broke down 2 times and hearing aid malfunctions, and so he has had some indulgences with Dove chocolate covered ice cream bites.  His weight is 268 lb (121.6 kg) today and has gained 1 pound since his last visit. He has lost 5 lbs since starting treatment with [name].  Hypertension Max [name] is a 73 y.o. male with hypertension. Max [name]'s blood pressure is controlled today. He denies chest pain, chest pressure, or headaches. He is working on weight loss to help control his blood pressure with the goal of decreasing his risk of heart attack and stroke.   Diabetes II with Hyperglycemia Max [name] has a diagnosis of diabetes type II. Max [name] states fasting BGs range between 69 and 306, a few in the 200's and 1 >300. He notes significant increase in stress. He is on [name], insulin, statin, and ACE I. Last A1c was 7.7. He has been working on intensive lifestyle modifications including diet, exercise, and weight loss to help control his blood glucose levels.  ASSESSMENT AND PLAN:  Essential hypertension - Plan: chlorthalidone (HYGROTON) 25 MG tablet  Type 2 diabetes mellitus with hyperglycemia, without long-term current use of insulin (HCC)  Class 2 severe obesity with serious comorbidity and body mass index (BMI) of 38.0 to 38.9 in adult, unspecified obesity type (Max [name])  PLAN:  Hypertension We discussed sodium restriction, working on healthy weight loss, and a regular exercise program as the means to achieve improved blood pressure control. Max [name] agreed with this plan and agreed to follow up as directed. We will continue to  monitor his blood pressure as well as his progress with the above lifestyle modifications. Max [name] agrees to continue taking chlorthalidone 25 mg PO q daily #90 with no refills. He will watch for signs of hypotension as he continues his lifestyle modifications. Max [name] agrees to follow up with our clinic in 2 weeks.  Diabetes II with Hyperglycemia Max [name] has been given extensive diabetes education by myself today including ideal fasting and post-prandial blood glucose readings, individual ideal Hgb A1c goals and hypoglycemia prevention. We discussed the importance of good blood sugar control to decrease the likelihood of diabetic complications such as nephropathy, neuropathy, limb loss, blindness, coronary artery disease, and death. We discussed the importance of intensive lifestyle modification including diet, exercise and weight loss as the first line treatment for diabetes. Max [name] agrees to continue his current diabetes medications and he agrees to follow up with our clinic in 2 weeks.  Obesity Max [name] is currently in the action stage of change. As such, his goal is to continue with weight loss efforts He has agreed to follow the Category 4 plan Max [name] has been instructed to work up to a goal of 150 minutes of combined cardio and strengthening exercise per week for weight loss and overall health benefits. We discussed the following Behavioral Modification Strategies today: increasing lean protein intake, increasing vegetables and work on meal planning and easy cooking plans, and planning for success   Max [name] has agreed to follow up with our clinic in 2 weeks. He was informed of the importance of frequent follow up visits  to maximize his success with intensive lifestyle modifications for his multiple health conditions.  ALLERGIES: Allergies  Allergen Reactions  . Butane Anxiety and Other (See Comments)    Hallucinations REACTIONS ARE SIDE EFFECTS   . Gabapentin Other (See Comments)     Causes agression  . Hydromorphone Swelling    SWELLING REACTION UNSPECIFIED   . Tizanidine Swelling    SWELLING REACTION UNSPECIFIED   . Topamax [Topiramate] Other (See Comments)    Hallucinations   . Buprenorphine Other (See Comments)    UNSPECIFIED REACTION   . Pork-Derived Products     Pt. Refuses pork derived products  . Latex Rash    Localized rash    MEDICATIONS: Current Outpatient Medications on File Prior to Visit  Medication Sig Dispense Refill  . alfuzosin (UROXATRAL) 10 MG 24 hr tablet Take 10 mg by mouth daily with breakfast.    . amLODipine (NORVASC) 10 MG tablet Take 10 mg by mouth daily.     . Blood Glucose Monitoring Suppl (ONETOUCH VERIO) w/Device KIT 1 kit by Does not apply route daily at 12 noon. Continuous Glucose Monitoring Kit 1 kit 0  . Cholecalciferol (D3-1000 PO) Take 1 capsule by mouth daily.    . DULoxetine (CYMBALTA) 60 MG capsule Take 60 mg by mouth 2 (two) times daily.    . ferrous sulfate 325 (65 FE) MG EC tablet Take 325 mg by mouth daily with breakfast.    . Flaxseed Oil OIL 750 mg daily.    Marland Kitchen glucose blood (ACCU-CHEK AVIVA) test strip Use as instructed 100 each 0  . hydrOXYzine (VISTARIL) 25 MG capsule   1  . Ibuprofen (ADVIL) 200 MG CAPS Take 2 capsules by mouth daily.    . insulin regular (NOVOLIN R RELION) 250 units/2.18m (100 units/mL) injection INJECT 15 UNITS SUBCUTANEOUSLY THREE TIMES DAILY BEFORE MEAL(S) (Patient taking differently: INJECT 20 UNITS SUBCUTANEOUSLY with breakfast and lunch only) 20 mL 0  . Lancets (ACCU-CHEK SAFE-T PRO) lancets Use as instructed 100 each 0  . Levorphanol Tartrate 3 MG TABS Take 3 mg by mouth QID.    .Marland Kitchenlosartan (COZAAR) 100 MG tablet Take 100 mg by mouth daily.    . Magnesium 400 MG TABS Take 1 tablet by mouth daily.    . naproxen sodium (ANAPROX) 220 MG tablet Take 220 mg by mouth daily as needed (for pain).     .Marland KitchenoxyCODONE (ROXICODONE) 15 MG immediate release tablet Take 10 mg by mouth 3 (three) times daily.      . Potassium 99 MG TABS Take 1 tablet by mouth daily.    . pravastatin (PRAVACHOL) 20 MG tablet Take 40 mg by mouth at bedtime.     . Probiotic Product (PROBIOTIC-10 PO) Take 1 capsule by mouth daily.    . tamsulosin (FLOMAX) 0.4 MG CAPS capsule Take 0.4 mg by mouth daily.     .Marland Kitchentopiramate (TOPAMAX) 25 MG tablet Take 25 mg by mouth 2 (two) times daily.    . [name] 1.5 MKX/3.8HWSOPN     . vitamin B-12 (CYANOCOBALAMIN) 1000 MCG tablet Take 1,000 mcg by mouth daily.     No current facility-administered medications on file prior to visit.     PAST MEDICAL HISTORY: Past Medical History:  Diagnosis Date  . ADD (attention deficit disorder)   . Anxiety   . Back pain   . Bipolar 1 disorder ([facility]   . Chronic pain   . Constipation   . Decreased hearing   .  Depression   . Diabetes mellitus without complication ([place])    Type II  . Dry skin   . DVT (deep venous thrombosis) ([place]) 2004   leg (not sure which leg)  . Dyslipidemia   . Dyspnea    with exertion  . Ear drainage   . Excessive hunger   . Excessive thirst   . Eye pain   . Fatigue   . Frequent urination   . Hard of hearing   . Hyperlipidemia   . Hypertension   . Itching   . Joint pain   . Lactose intolerance   . Leg cramps   . Mouth sores   . Nasal and sinus discharge   . Nausea   . Numbness and tingling    bilateral  . OSA (obstructive sleep apnea)    wears CPAP  . Shortness of breath   . Shortness of breath on exertion   . Sinus congestion   . Stiff neck   . Stress   . Swallowing difficulty   . Swelling of extremity   . Tinnitus   . Trouble in sleeping   . Urinary frequency   . Vision changes   . Weakness   . Wears glasses     PAST SURGICAL HISTORY: Past Surgical History:  Procedure Laterality Date  . arm surgery Right   . BACK SURGERY  2004  . COLONOSCOPY    . ESOPHAGOGASTRODUODENOSCOPY    . EYE SURGERY Right    cataract  . SUBMANDIBULAR GLAND EXCISION Right 03/10/2016   Procedure: INTRAORAL   SUBMANDIBULAR  STONE REMOVAL;  Surgeon: Izora Gala, MD;  Location: [facility];  Service: ENT;  Laterality: Right;    SOCIAL HISTORY: Social History   Tobacco Use  . Smoking status: Never Smoker  . Smokeless tobacco: Never Used  Substance Use Topics  . Alcohol use: No  . Drug use: No    FAMILY HISTORY: Family History  Problem Relation Age of Onset  . Diabetes Mother   . Hypertension Mother   . Hyperlipidemia Mother   . Kidney disease Mother   . Cancer Mother   . Diabetes Brother     ROS: Review of Systems  Constitutional: Negative for weight loss.  Cardiovascular: Negative for chest pain.       Negative chest pressure  Neurological: Negative for headaches.  Endo/Heme/Allergies:       Negative hypoglycemia    PHYSICAL EXAM: Blood pressure 134/65, pulse 89, temperature 97.8 F (36.6 C), temperature source Oral, height _0  (1.778 m), weight 268 lb (121.6 kg), SpO2 96 %. Body mass index is 38.45 kg/m. Physical Exam Vitals signs reviewed.  Constitutional:      Appearance: Normal appearance. He is obese.  Cardiovascular:     Rate and Rhythm: Normal rate.     Pulses: Normal pulses.  Pulmonary:     Effort: Pulmonary effort is normal.     Breath sounds: Normal breath sounds.  Musculoskeletal: Normal range of motion.  Skin:    General: Skin is warm and dry.  Neurological:     Mental Status: He is alert and oriented to person, place, and time.  Psychiatric:        Mood and Affect: Mood normal.        Behavior: Behavior normal.     RECENT LABS AND TESTS: BMET    Component Value Date/Time   NA 139 05/15/2018 0946   K 4.1 05/15/2018 0946   CL 98 05/15/2018 0946   CO2  26 05/15/2018 0946   GLUCOSE 173 (H) 05/15/2018 0946   GLUCOSE 208 (H) 03/09/2016 1424   BUN 14 05/15/2018 0946   CREATININE 0.95 05/15/2018 0946   CALCIUM 9.2 05/15/2018 0946   GFRNONAA 81 05/15/2018 0946   GFRAA 93 05/15/2018 0946   Lab Results  Component Value Date   HGBA1C 7.7 (H)  05/15/2018   HGBA1C 8.3 (H) 09/28/2017   HGBA1C 7.2 (H) 03/09/2016   HGBA1C 7.9 08/12/2015   No results found for: INSULIN CBC    Component Value Date/Time   WBC 10.8 09/28/2017 1145   WBC 11.6 (H) 03/09/2016 1424   RBC 5.32 09/28/2017 1145   RBC 5.17 03/09/2016 1424   HGB 13.8 09/28/2017 1145   HCT 42.3 09/28/2017 1145   PLT 291 03/09/2016 1424   MCV 80 09/28/2017 1145   MCH 25.9 (L) 09/28/2017 1145   MCH 26.7 03/09/2016 1424   MCHC 32.6 09/28/2017 1145   MCHC 33.3 03/09/2016 1424   RDW 14.6 09/28/2017 1145   LYMPHSABS 1.7 09/28/2017 1145   EOSABS 0.1 09/28/2017 1145   BASOSABS 0.0 09/28/2017 1145   Iron/TIBC/Ferritin/ %Sat No results found for: IRON, TIBC, FERRITIN, IRONPCTSAT Lipid Panel     Component Value Date/Time   CHOL 185 05/15/2018 0946   TRIG 99 05/15/2018 0946   HDL 45 05/15/2018 0946   CHOLHDL 3.8 Ratio 01/09/2008 2228   VLDL 29 01/09/2008 2228   LDLCALC 120 (H) 05/15/2018 0946   Hepatic Function Panel     Component Value Date/Time   PROT 6.6 05/15/2018 0946   ALBUMIN 4.2 05/15/2018 0946   AST 16 05/15/2018 0946   ALT 17 05/15/2018 0946   ALKPHOS 94 05/15/2018 0946   BILITOT 0.2 05/15/2018 0946      Component Value Date/Time   TSH 0.471 09/28/2017 1145   TSH 0.499 02/15/2008 2052   TSH 0.106 (L) 01/09/2008 2228      OBESITY BEHAVIORAL INTERVENTION VISIT  Today's visit was # 15   Starting weight: 273 lbs Starting date: 09/28/17 Today's weight : 268 lbs Today's date: 05/29/2018 Total lbs lost to date: 5 At least 15 minutes were spent on discussing the following behavioral intervention visit.    05/29/2018  Height _0  (1.778 m)  Weight 268 lb (121.6 kg)  BMI (Calculated) 38.45  BLOOD PRESSURE - SYSTOLIC 660  BLOOD PRESSURE - DIASTOLIC 65   Body Fat % 63.0 %  Total Body Water (lbs) 123.2 lbs    ASK: We discussed the diagnosis of obesity with Max [name] today and Max [name] agreed to give [name] permission to discuss obesity  behavioral modification therapy today.  ASSESS: Max [name] has the diagnosis of obesity and his BMI today is 38.45 Max [name] is in the action stage of change   ADVISE: Max [name] was educated on the multiple health risks of obesity as well as the benefit of weight loss to improve his health. He was advised of the need for long term treatment and the importance of lifestyle modifications to improve his current health and to decrease his risk of future health problems.  AGREE: Multiple dietary modification options and treatment options were discussed and  Max [name] agreed to follow the recommendations documented in the above note.  ARRANGE: Max [name] was educated on the importance of frequent visits to treat obesity as outlined per CMS and USPSTF guidelines and agreed to schedule his next follow up appointment today.  Max [name], am acting as transcriptionist for Ilene Qua, MD  I have reviewed  the above documentation for accuracy and completeness, and I agree with the above. - Ilene Qua, MD

## 2018-06-19 ENCOUNTER — Ambulatory Visit (INDEPENDENT_AMBULATORY_CARE_PROVIDER_SITE_OTHER): Payer: Medicare Other | Admitting: Family Medicine

## 2018-06-19 ENCOUNTER — Encounter (INDEPENDENT_AMBULATORY_CARE_PROVIDER_SITE_OTHER): Payer: Self-pay

## 2018-06-22 ENCOUNTER — Encounter (INDEPENDENT_AMBULATORY_CARE_PROVIDER_SITE_OTHER): Payer: Self-pay

## 2018-07-17 ENCOUNTER — Ambulatory Visit: Payer: Medicare Other | Admitting: Internal Medicine

## 2018-08-25 ENCOUNTER — Other Ambulatory Visit (INDEPENDENT_AMBULATORY_CARE_PROVIDER_SITE_OTHER): Payer: Self-pay | Admitting: Family Medicine

## 2018-08-25 DIAGNOSIS — I10 Essential (primary) hypertension: Secondary | ICD-10-CM

## 2018-09-14 ENCOUNTER — Ambulatory Visit (INDEPENDENT_AMBULATORY_CARE_PROVIDER_SITE_OTHER): Payer: Medicare Other | Admitting: Family Medicine

## 2018-09-14 ENCOUNTER — Other Ambulatory Visit: Payer: Self-pay

## 2018-09-14 ENCOUNTER — Encounter (INDEPENDENT_AMBULATORY_CARE_PROVIDER_SITE_OTHER): Payer: Self-pay | Admitting: Family Medicine

## 2018-09-14 DIAGNOSIS — E1165 Type 2 diabetes mellitus with hyperglycemia: Secondary | ICD-10-CM | POA: Diagnosis not present

## 2018-09-14 DIAGNOSIS — I10 Essential (primary) hypertension: Secondary | ICD-10-CM | POA: Diagnosis not present

## 2018-09-14 DIAGNOSIS — Z6838 Body mass index (BMI) 38.0-38.9, adult: Secondary | ICD-10-CM

## 2018-09-14 MED ORDER — CHLORTHALIDONE 25 MG PO TABS: 25.0000 mg | ORAL_TABLET | Freq: Every day | ORAL | 0 refills | Status: DC

## 2018-09-18 NOTE — Progress Notes (Signed)
Office: [phone]  /  Fax: [phone] TeleHealth Visit:  Max [name] has verbally consented to this TeleHealth visit today. The patient is located at home, the provider is located at the [facility] office. The participants in this visit include the listed provider and patient. Gleason was unable to use realtime audiovisual technology today and the telehealth visit was conducted via telephone (18 minutes).   HPI:   Chief Complaint: OBESITY Max [name] is here to discuss his progress with his obesity treatment plan. He is on the Category 4 plan and is following his eating plan approximately 40 % of the time. He states he is exercising 0 minutes 0 times per week. This is Max [name]'s first telehealth visit. He weighed in at 264 lbs at home (down 4 lbs from last in office appointment). He has tried to be careful with his choices the last few months. He hasn't been able to stick to the diet as well, and ultimately had to stop yogurt because he was eating too much.  We were unable to weigh the patient today for this TeleHealth visit. He feels as if he has lost 4 lbs since his last visit. He has lost 5 lbs since starting treatment with [name].  Hypertension KHORI [name] is a 73 y.o. male with hypertension. Delman's blood pressure at home is 130/70 (better than prior readings in the office). He denies chest pain, chest pressure, or headaches. He is working on weight loss to help control his blood pressure with the goal of decreasing his risk of heart attack and stroke.  Diabetes II with Hyperglycemia Max [name] has a diagnosis of diabetes type II. [name] states his BGs after breakfast today was 103. Fasting BGs at 98 today, and average glucose is 148. He denies hypoglycemia. He is taking 30 units BID of insulin. Last A1c was 7.7. He has been working on intensive lifestyle modifications including diet, exercise, and weight loss to help control his blood glucose levels.  ASSESSMENT  AND PLAN:  Essential hypertension - Plan: chlorthalidone (HYGROTON) 25 MG tablet  Type 2 diabetes mellitus with hyperglycemia, without long-term current use of insulin (HCC)  Class 2 severe obesity with serious comorbidity and body mass index (BMI) of 38.0 to 38.9 in adult, unspecified obesity type ([place])  PLAN:  Hypertension We discussed sodium restriction, working on healthy weight loss, and a regular exercise program as the means to achieve improved blood pressure control. [name] agreed with this plan and agreed to follow up as directed. We will continue to monitor his blood pressure as well as his progress with the above lifestyle modifications. [name] agrees to chlorthalidone 25 mg PO daily #30 and we will refill for 1 month. He will watch for signs of hypotension as he continues his lifestyle modifications. Jt agrees to follow up with our clinic in 2 weeks.  Diabetes II with Hyperglycemia Max [name] has been given extensive diabetes education by myself today including ideal fasting and post-prandial blood glucose readings, individual ideal Hgb A1c goals and hypoglycemia prevention. We discussed the importance of good blood sugar control to decrease the likelihood of diabetic complications such as nephropathy, neuropathy, limb loss, blindness, coronary artery disease, and death. We discussed the importance of intensive lifestyle modification including diet, exercise and weight loss as the first line treatment for diabetes. Max [name] agrees to continue his diabetes medications and he will continue checking his BGs. Max [name] agrees to follow up with our clinic in 2 weeks.  Obesity Max [name] is currently in the  action stage of change. As such, his goal is to continue with weight loss efforts He has agreed to follow the Category 4 plan Max [name] has been instructed to work up to a goal of 150 minutes of combined cardio and strengthening exercise per week for weight loss and overall health benefits.  We discussed the following Behavioral Modification Strategies today: increasing lean protein intake, increasing vegetables and work on meal planning and easy cooking plans, keeping healthy foods in the home, and planning for success   Max [name] has agreed to follow up with our clinic in 2 weeks. He was informed of the importance of frequent follow up visits to maximize his success with intensive lifestyle modifications for his multiple health conditions.  ALLERGIES: Allergies  Allergen Reactions  . Butane Anxiety and Other (See Comments)    Hallucinations REACTIONS ARE SIDE EFFECTS   . Gabapentin Other (See Comments)    Causes agression  . Hydromorphone Swelling    SWELLING REACTION UNSPECIFIED   . Tizanidine Swelling    SWELLING REACTION UNSPECIFIED   . Topamax [Topiramate] Other (See Comments)    Hallucinations   . Buprenorphine Other (See Comments)    UNSPECIFIED REACTION   . Pork-Derived Products     Pt. Refuses pork derived products  . Latex Rash    Localized rash    MEDICATIONS: Current Outpatient Medications on File Prior to Visit  Medication Sig Dispense Refill  . alfuzosin (UROXATRAL) 10 MG 24 hr tablet Take 10 mg by mouth daily with breakfast.    . amLODipine (NORVASC) 10 MG tablet Take 10 mg by mouth daily.     . Blood Glucose Monitoring Suppl (ONETOUCH VERIO) w/Device KIT 1 kit by Does not apply route daily at 12 noon. Continuous Glucose Monitoring Kit 1 kit 0  . Cholecalciferol (D3-1000 PO) Take 1 capsule by mouth daily.    . DULoxetine (CYMBALTA) 60 MG capsule Take 60 mg by mouth 2 (two) times daily.    . ferrous sulfate 325 (65 FE) MG EC tablet Take 325 mg by mouth daily with breakfast.    . Flaxseed Oil OIL 750 mg daily.    Marland Kitchen glucose blood (ACCU-CHEK AVIVA) test strip Use as instructed 100 each 0  . hydrOXYzine (VISTARIL) 25 MG capsule   1  . Ibuprofen (ADVIL) 200 MG CAPS Take 2 capsules by mouth daily.    . insulin regular (NOVOLIN R RELION) 250 units/2.50m  (100 units/mL) injection INJECT 15 UNITS SUBCUTANEOUSLY THREE TIMES DAILY BEFORE MEAL(S) (Patient taking differently: INJECT 20 UNITS SUBCUTANEOUSLY with breakfast and lunch only) 20 mL 0  . Lancets (ACCU-CHEK SAFE-T PRO) lancets Use as instructed 100 each 0  . Levorphanol Tartrate 3 MG TABS Take 3 mg by mouth QID.    .Marland Kitchenlosartan (COZAAR) 100 MG tablet Take 100 mg by mouth daily.    . Magnesium 400 MG TABS Take 1 tablet by mouth daily.    . naproxen sodium (ANAPROX) 220 MG tablet Take 220 mg by mouth daily as needed (for pain).     .Marland KitchenoxyCODONE (ROXICODONE) 15 MG immediate release tablet Take 10 mg by mouth 3 (three) times daily.     . Potassium 99 MG TABS Take 1 tablet by mouth daily.    . pravastatin (PRAVACHOL) 20 MG tablet Take 40 mg by mouth at bedtime.     . Probiotic Product (PROBIOTIC-10 PO) Take 1 capsule by mouth daily.    . tamsulosin (FLOMAX) 0.4 MG CAPS capsule Take 0.4 mg by mouth  daily.     . topiramate (TOPAMAX) 25 MG tablet Take 25 mg by mouth 2 (two) times daily.    . [name] 1.5 XK/4.8JE SOPN     . vitamin B-12 (CYANOCOBALAMIN) 1000 MCG tablet Take 1,000 mcg by mouth daily.     No current facility-administered medications on file prior to visit.     PAST MEDICAL HISTORY: Past Medical History:  Diagnosis Date  . ADD (attention deficit disorder)   . Anxiety   . Back pain   . Bipolar 1 disorder ([place])   . Chronic pain   . Constipation   . Decreased hearing   . Depression   . Diabetes mellitus without complication ([place])    Type II  . Dry skin   . DVT (deep venous thrombosis) ([place]) 2004   leg (not sure which leg)  . Dyslipidemia   . Dyspnea    with exertion  . Ear drainage   . Excessive hunger   . Excessive thirst   . Eye pain   . Fatigue   . Frequent urination   . Hard of hearing   . Hyperlipidemia   . Hypertension   . Itching   . Joint pain   . Lactose intolerance   . Leg cramps   . Mouth sores   . Nasal and sinus discharge   . Nausea   . Numbness  and tingling    bilateral  . OSA (obstructive sleep apnea)    wears CPAP  . Shortness of breath   . Shortness of breath on exertion   . Sinus congestion   . Stiff neck   . Stress   . Swallowing difficulty   . Swelling of extremity   . Tinnitus   . Trouble in sleeping   . Urinary frequency   . Vision changes   . Weakness   . Wears glasses     PAST SURGICAL HISTORY: Past Surgical History:  Procedure Laterality Date  . arm surgery Right   . BACK SURGERY  2004  . COLONOSCOPY    . ESOPHAGOGASTRODUODENOSCOPY    . EYE SURGERY Right    cataract  . SUBMANDIBULAR GLAND EXCISION Right 03/10/2016   Procedure: INTRAORAL  SUBMANDIBULAR  STONE REMOVAL;  Surgeon: Izora Gala, MD;  Location: [facility];  Service: ENT;  Laterality: Right;    SOCIAL HISTORY: Social History   Tobacco Use  . Smoking status: Never Smoker  . Smokeless tobacco: Never Used  Substance Use Topics  . Alcohol use: No  . Drug use: No    FAMILY HISTORY: Family History  Problem Relation Age of Onset  . Diabetes Mother   . Hypertension Mother   . Hyperlipidemia Mother   . Kidney disease Mother   . Cancer Mother   . Diabetes Brother     ROS: Review of Systems  Constitutional: Positive for weight loss.  Cardiovascular: Negative for chest pain.       Negative chest pressure  Neurological: Negative for headaches.  Endo/Heme/Allergies:       Negative hypoglycemia    PHYSICAL EXAM: Pt in no acute distress  RECENT LABS AND TESTS: BMET    Component Value Date/Time   NA 139 05/15/2018 0946   K 4.1 05/15/2018 0946   CL 98 05/15/2018 0946   CO2 26 05/15/2018 0946   GLUCOSE 173 (H) 05/15/2018 0946   GLUCOSE 208 (H) 03/09/2016 1424   BUN 14 05/15/2018 0946   CREATININE 0.95 05/15/2018 0946   CALCIUM 9.2 05/15/2018 0946  GFRNONAA 81 05/15/2018 0946   GFRAA 93 05/15/2018 0946   Lab Results  Component Value Date   HGBA1C 7.7 (H) 05/15/2018   HGBA1C 8.3 (H) 09/28/2017   HGBA1C 7.2 (H) 03/09/2016    HGBA1C 7.9 08/12/2015   No results found for: INSULIN CBC    Component Value Date/Time   WBC 10.8 09/28/2017 1145   WBC 11.6 (H) 03/09/2016 1424   RBC 5.32 09/28/2017 1145   RBC 5.17 03/09/2016 1424   HGB 13.8 09/28/2017 1145   HCT 42.3 09/28/2017 1145   PLT 291 03/09/2016 1424   MCV 80 09/28/2017 1145   MCH 25.9 (L) 09/28/2017 1145   MCH 26.7 03/09/2016 1424   MCHC 32.6 09/28/2017 1145   MCHC 33.3 03/09/2016 1424   RDW 14.6 09/28/2017 1145   LYMPHSABS 1.7 09/28/2017 1145   EOSABS 0.1 09/28/2017 1145   BASOSABS 0.0 09/28/2017 1145   Iron/TIBC/Ferritin/ %Sat No results found for: IRON, TIBC, FERRITIN, IRONPCTSAT Lipid Panel     Component Value Date/Time   CHOL 185 05/15/2018 0946   TRIG 99 05/15/2018 0946   HDL 45 05/15/2018 0946   CHOLHDL 3.8 Ratio 01/09/2008 2228   VLDL 29 01/09/2008 2228   LDLCALC 120 (H) 05/15/2018 0946   Hepatic Function Panel     Component Value Date/Time   PROT 6.6 05/15/2018 0946   ALBUMIN 4.2 05/15/2018 0946   AST 16 05/15/2018 0946   ALT 17 05/15/2018 0946   ALKPHOS 94 05/15/2018 0946   BILITOT 0.2 05/15/2018 0946      Component Value Date/Time   TSH 0.471 09/28/2017 1145   TSH 0.499 02/15/2008 2052   TSH 0.106 (L) 01/09/2008 2228      I, Trixie Dredge, am acting as transcriptionist for Ilene Qua, MD  I have reviewed the above documentation for accuracy and completeness, and I agree with the above. - Ilene Qua, MD

## 2018-09-28 ENCOUNTER — Other Ambulatory Visit: Payer: Self-pay

## 2018-09-28 ENCOUNTER — Encounter (INDEPENDENT_AMBULATORY_CARE_PROVIDER_SITE_OTHER): Payer: Self-pay | Admitting: Family Medicine

## 2018-09-28 ENCOUNTER — Telehealth (INDEPENDENT_AMBULATORY_CARE_PROVIDER_SITE_OTHER): Payer: Medicare Other | Admitting: Family Medicine

## 2018-09-28 DIAGNOSIS — Z794 Long term (current) use of insulin: Secondary | ICD-10-CM | POA: Diagnosis not present

## 2018-09-28 DIAGNOSIS — E1165 Type 2 diabetes mellitus with hyperglycemia: Secondary | ICD-10-CM | POA: Diagnosis not present

## 2018-09-28 DIAGNOSIS — I10 Essential (primary) hypertension: Secondary | ICD-10-CM | POA: Diagnosis not present

## 2018-09-28 DIAGNOSIS — Z6841 Body Mass Index (BMI) 40.0 and over, adult: Secondary | ICD-10-CM

## 2018-10-03 NOTE — Progress Notes (Signed)
Office: [phone]  /  Fax: [phone] TeleHealth Visit:  Max [name] has verbally consented to this TeleHealth visit today. The patient is located at home, the provider is located at the [employer] [facility] office. The participants in this visit include the listed provider and patient and patient's wife. Max [name] was unable to use realtime audiovisual technology today and the telehealth visit was conducted via audio only (12 minutes).   HPI:   Chief Complaint: OBESITY Max [name] is here to discuss his progress with his obesity treatment plan. He is on the Category 4 plan and is following his eating plan approximately 80 % of the time. He states he is walking 1/2 mile 4 times Max [name] week. Max [name] reports a weight gain to 272 lbs this morning (previous reported weight of 264 lbs). He voices he knows he can do better in terms of following the plan. He reports he isn't eating as much and he is not walking as much.  We were unable to weigh the patient today for this TeleHealth visit. He feels as if he has gained 8 lbs since his last visit. He has lost 5 lbs since starting treatment with [name].  Diabetes II with Hyperglycemia Max [name] has a diagnosis of diabetes type II. Max [name] states his highest BGs average around 250, but most BGs in 130's. He states his BGs this morning was 124. He is taking between 30 and 40 units daily. Last A1c was 7.7. He has been working on intensive lifestyle modifications including diet, exercise, and weight loss to help control his blood glucose levels.  Hypertension Max [name] is a 73 y.o. male with hypertension. Max [name]'s blood pressure has been labile in the prior 10 appointments. No reported blood pressure reading today. He denies chest pain. He is working on weight loss to help control his blood pressure with the goal of decreasing his risk of heart attack and stroke.   ASSESSMENT AND PLAN:  Type 2 diabetes mellitus with hyperglycemia, with  long-term current use of insulin (HCC)  Essential hypertension  Class 3 severe obesity with serious comorbidity and body mass index (BMI) of 40.0 to 44.9 in adult, unspecified obesity type ([place])  PLAN:  Diabetes II with Hyperglycemia Max [name] has been given extensive diabetes education by myself today including ideal fasting and post-prandial blood glucose readings, individual ideal Hgb A1c goals and hypoglycemia prevention. We discussed the importance of good blood sugar control to decrease the likelihood of diabetic complications such as nephropathy, neuropathy, limb loss, blindness, coronary artery disease, and death. We discussed the importance of intensive lifestyle modification including diet, exercise and weight loss as the first line treatment for diabetes. Max [name] agrees to continue his diabetes medications, and we will need to repeat labs at his first in office appointment. Hasson agrees to follow up with our clinic in 2 weeks.  Hypertension We discussed sodium restriction, working on healthy weight loss, and a regular exercise program as the means to achieve improved blood pressure control. Max [name] agreed with this plan and agreed to follow up as directed. We will continue to monitor his blood pressure as well as his progress with the above lifestyle modifications. Max [name] agrees to continue his current medications, and he was encouraged to check his blood pressure at home 3 to 4 time in the next 2 weeks. He will watch for signs of hypotension as he continues his lifestyle modifications. Max [name] agrees to follow up with our clinic in 2 weeks.  Obesity Max [name] is currently in the  action stage of change. As such, his goal is to continue with weight loss efforts He has agreed to follow the Category 3 plan Max [name] has been instructed to work up to a goal of 150 minutes of combined cardio and strengthening exercise Max [name] week for weight loss and overall health benefits. We discussed the  following Behavioral Modification Strategies today: increasing lean protein intake, increasing vegetables and work on meal planning and easy cooking plans, keeping healthy foods in the home, and planning for success   Max [name] has agreed to follow up with our clinic in 2 weeks. He was informed of the importance of frequent follow up visits to maximize his success with intensive lifestyle modifications for his multiple health conditions.  ALLERGIES: Allergies  Allergen Reactions  . Butane Anxiety and Other (See Comments)    Hallucinations REACTIONS ARE SIDE EFFECTS   . Gabapentin Other (See Comments)    Causes agression  . Hydromorphone Swelling    SWELLING REACTION UNSPECIFIED   . Tizanidine Swelling    SWELLING REACTION UNSPECIFIED   . Topamax [Topiramate] Other (See Comments)    Hallucinations   . Buprenorphine Other (See Comments)    UNSPECIFIED REACTION   . Pork-Derived Products     Pt. Refuses pork derived products  . Latex Rash    Localized rash    MEDICATIONS: Current Outpatient Medications on File Prior to Visit  Medication Sig Dispense Refill  . alfuzosin (UROXATRAL) 10 MG 24 hr tablet Take 10 mg by mouth daily with breakfast.    . amLODipine (NORVASC) 10 MG tablet Take 10 mg by mouth daily.     . Blood Glucose Monitoring Suppl (ONETOUCH VERIO) w/Device KIT 1 kit by Does not apply route daily at 12 noon. Continuous Glucose Monitoring Kit 1 kit 0  . chlorthalidone (HYGROTON) 25 MG tablet Take 1 tablet (25 mg total) by mouth daily. 90 tablet 0  . Cholecalciferol (D3-1000 PO) Take 1 capsule by mouth daily.    . DULoxetine (CYMBALTA) 60 MG capsule Take 60 mg by mouth 2 (two) times daily.    . ferrous sulfate 325 (65 FE) MG EC tablet Take 325 mg by mouth daily with breakfast.    . Flaxseed Oil OIL 750 mg daily.    Marland Kitchen glucose blood (ACCU-CHEK AVIVA) test strip Use as instructed 100 each 0  . hydrOXYzine (VISTARIL) 25 MG capsule   1  . Ibuprofen (ADVIL) 200 MG CAPS Take 2  capsules by mouth daily.    . insulin regular (NOVOLIN R RELION) 250 units/2.68m (100 units/mL) injection INJECT 15 UNITS SUBCUTANEOUSLY THREE TIMES DAILY BEFORE MEAL(S) (Patient taking differently: INJECT 20 UNITS SUBCUTANEOUSLY with breakfast and lunch only) 20 mL 0  . Lancets (ACCU-CHEK SAFE-T PRO) lancets Use as instructed 100 each 0  . Levorphanol Tartrate 3 MG TABS Take 3 mg by mouth QID.    .Marland Kitchenlosartan (COZAAR) 100 MG tablet Take 100 mg by mouth daily.    . Magnesium 400 MG TABS Take 1 tablet by mouth daily.    . naproxen sodium (ANAPROX) 220 MG tablet Take 220 mg by mouth daily as needed (for pain).     .Marland KitchenoxyCODONE (ROXICODONE) 15 MG immediate release tablet Take 10 mg by mouth 3 (three) times daily.     . Potassium 99 MG TABS Take 1 tablet by mouth daily.    . pravastatin (PRAVACHOL) 20 MG tablet Take 40 mg by mouth at bedtime.     . Probiotic Product (PROBIOTIC-10 PO) Take 1  capsule by mouth daily.    . tamsulosin (FLOMAX) 0.4 MG CAPS capsule Take 0.4 mg by mouth daily.     . [name] 1.5 MA/2.6JF SOPN     . vitamin B-12 (CYANOCOBALAMIN) 1000 MCG tablet Take 1,000 mcg by mouth daily.    Marland Kitchen topiramate (TOPAMAX) 25 MG tablet Take 25 mg by mouth 2 (two) times daily.     No current facility-administered medications on file prior to visit.     PAST MEDICAL HISTORY: Past Medical History:  Diagnosis Date  . ADD (attention deficit disorder)   . Anxiety   . Back pain   . Bipolar 1 disorder ([place])   . Chronic pain   . Constipation   . Decreased hearing   . Depression   . Diabetes mellitus without complication ([place])    Type II  . Dry skin   . DVT (deep venous thrombosis) ([place]) 2004   leg (not sure which leg)  . Dyslipidemia   . Dyspnea    with exertion  . Ear drainage   . Excessive hunger   . Excessive thirst   . Eye pain   . Fatigue   . Frequent urination   . Hard of hearing   . Hyperlipidemia   . Hypertension   . Itching   . Joint pain   . Lactose intolerance   . Leg  cramps   . Mouth sores   . Nasal and sinus discharge   . Nausea   . Numbness and tingling    bilateral  . OSA (obstructive sleep apnea)    wears CPAP  . Shortness of breath   . Shortness of breath on exertion   . Sinus congestion   . Stiff neck   . Stress   . Swallowing difficulty   . Swelling of extremity   . Tinnitus   . Trouble in sleeping   . Urinary frequency   . Vision changes   . Weakness   . Wears glasses     PAST SURGICAL HISTORY: Past Surgical History:  Procedure Laterality Date  . arm surgery Right   . BACK SURGERY  2004  . COLONOSCOPY    . ESOPHAGOGASTRODUODENOSCOPY    . EYE SURGERY Right    cataract  . SUBMANDIBULAR GLAND EXCISION Right 03/10/2016   Procedure: INTRAORAL  SUBMANDIBULAR  STONE REMOVAL;  Surgeon: Izora Gala, MD;  Location: [place];  Service: ENT;  Laterality: Right;    SOCIAL HISTORY: Social History   Tobacco Use  . Smoking status: Never Smoker  . Smokeless tobacco: Never Used  Substance Use Topics  . Alcohol use: No  . Drug use: No    FAMILY HISTORY: Family History  Problem Relation Age of Onset  . Diabetes Mother   . Hypertension Mother   . Hyperlipidemia Mother   . Kidney disease Mother   . Cancer Mother   . Diabetes Brother     ROS: Review of Systems  Constitutional: Negative for weight loss.  Cardiovascular: Negative for chest pain.  Endo/Heme/Allergies:       Negative hypoglycemia    PHYSICAL EXAM: Pt in no acute distress  RECENT LABS AND TESTS: BMET    Component Value Date/Time   NA 139 05/15/2018 0946   K 4.1 05/15/2018 0946   CL 98 05/15/2018 0946   CO2 26 05/15/2018 0946   GLUCOSE 173 (H) 05/15/2018 0946   GLUCOSE 208 (H) 03/09/2016 1424   BUN 14 05/15/2018 0946   CREATININE 0.95 05/15/2018 0946  CALCIUM 9.2 05/15/2018 0946   GFRNONAA 81 05/15/2018 0946   GFRAA 93 05/15/2018 0946   Lab Results  Component Value Date   HGBA1C 7.7 (H) 05/15/2018   HGBA1C 8.3 (H) 09/28/2017   HGBA1C 7.2 (H)  03/09/2016   HGBA1C 7.9 08/12/2015   No results found for: INSULIN CBC    Component Value Date/Time   WBC 10.8 09/28/2017 1145   WBC 11.6 (H) 03/09/2016 1424   RBC 5.32 09/28/2017 1145   RBC 5.17 03/09/2016 1424   HGB 13.8 09/28/2017 1145   HCT 42.3 09/28/2017 1145   PLT 291 03/09/2016 1424   MCV 80 09/28/2017 1145   MCH 25.9 (L) 09/28/2017 1145   MCH 26.7 03/09/2016 1424   MCHC 32.6 09/28/2017 1145   MCHC 33.3 03/09/2016 1424   RDW 14.6 09/28/2017 1145   LYMPHSABS 1.7 09/28/2017 1145   EOSABS 0.1 09/28/2017 1145   BASOSABS 0.0 09/28/2017 1145   Iron/TIBC/Ferritin/ %Sat No results found for: IRON, TIBC, FERRITIN, IRONPCTSAT Lipid Panel     Component Value Date/Time   CHOL 185 05/15/2018 0946   TRIG 99 05/15/2018 0946   HDL 45 05/15/2018 0946   CHOLHDL 3.8 Ratio 01/09/2008 2228   VLDL 29 01/09/2008 2228   LDLCALC 120 (H) 05/15/2018 0946   Hepatic Function Panel     Component Value Date/Time   PROT 6.6 05/15/2018 0946   ALBUMIN 4.2 05/15/2018 0946   AST 16 05/15/2018 0946   ALT 17 05/15/2018 0946   ALKPHOS 94 05/15/2018 0946   BILITOT 0.2 05/15/2018 0946      Component Value Date/Time   TSH 0.471 09/28/2017 1145   TSH 0.499 02/15/2008 2052   TSH 0.106 (L) 01/09/2008 2228      I, Trixie Dredge, am acting as transcriptionist for Ilene Qua, MD  I have reviewed the above documentation for accuracy and completeness, and I agree with the above. - Ilene Qua, MD

## 2018-10-12 ENCOUNTER — Telehealth (INDEPENDENT_AMBULATORY_CARE_PROVIDER_SITE_OTHER): Payer: Medicare Other | Admitting: Family Medicine

## 2018-10-12 ENCOUNTER — Other Ambulatory Visit: Payer: Self-pay

## 2018-10-12 ENCOUNTER — Encounter (INDEPENDENT_AMBULATORY_CARE_PROVIDER_SITE_OTHER): Payer: Self-pay | Admitting: Family Medicine

## 2018-10-12 DIAGNOSIS — Z6839 Body mass index (BMI) 39.0-39.9, adult: Secondary | ICD-10-CM

## 2018-10-12 DIAGNOSIS — I10 Essential (primary) hypertension: Secondary | ICD-10-CM

## 2018-10-12 DIAGNOSIS — E1165 Type 2 diabetes mellitus with hyperglycemia: Secondary | ICD-10-CM

## 2018-10-12 DIAGNOSIS — Z794 Long term (current) use of insulin: Secondary | ICD-10-CM | POA: Diagnosis not present

## 2018-10-12 MED ORDER — INSULIN REGULAR HUMAN 100 UNIT/ML IJ SOLN: 30.0000 [IU] | Freq: Three times a day (TID) | INTRAMUSCULAR | 0 refills | Status: DC

## 2018-10-17 NOTE — Progress Notes (Signed)
Office: [phone]  /  Fax: [phone] TeleHealth Visit:  Max [name] has verbally consented to this TeleHealth visit today. The patient is located at home, the provider is located at the [employer] [facility] office. The participants in this visit include the listed provider, patient, spouse Max [name]) and any and all parties involved. The visit was conducted today via telephone. Max [name] was unable to use realtime audiovisual technology today and the telehealth visit was conducted via telephone.  HPI:   Chief Complaint: OBESITY Max [name] is here to discuss his progress with his obesity treatment plan. He is on the Category 3 plan and is following his eating plan approximately 70 % of the time. He states he is walking 30 minutes 2 to 3 times per week. Max [name] has a weight of 267 pounds today (down 5 pounds from last virtual appt, previous reported wt 272 lbs). He has stopped all yogurt, secondary to bloating and constipation. Max [name] reports his wife has been helpful in keeping him on track. We were unable to weigh the patient today for this TeleHealth visit. He feels as if he has lost weight since his last visit. He has lost 5 lbs since starting treatment with [name].  Diabetes II with hyperglycemia on insulin Max [name] has a diagnosis of diabetes type II. Max [name] states fasting blood sugar was 68 this morning (patient believes he may have taken insulin 2 times last night). He has been working on intensive lifestyle modifications including diet, exercise, and weight loss to help control his blood glucose levels.  Hypertension Max [name] is a 73 y.o. male with hypertension. His blood pressure has been in the acceptable range per patient. Max [name] denies chest pain, chest pressure or headache. He is working weight loss to help control his blood pressure with the goal of decreasing his risk of heart attack and stroke. Max [name] blood pressure is currently controlled.   ASSESSMENT AND PLAN:  Type 2 diabetes mellitus with hyperglycemia, with long-term current use of insulin (HCC)  Essential hypertension  Class 2 severe obesity with serious comorbidity and body mass index (BMI) of 39.0 to 39.9 in adult, unspecified obesity type ([place])  PLAN:  Diabetes II with hyperglycemia on insulin Max [name] has been given extensive diabetes education by myself today including ideal fasting and post-prandial blood glucose readings, individual ideal Hgb A1c goals and hypoglycemia prevention. We discussed the importance of good blood sugar control to decrease the likelihood of diabetic complications such as nephropathy, neuropathy, limb loss, blindness, coronary artery disease, and death. We discussed the importance of intensive lifestyle modification including diet, exercise and weight loss as the first line treatment for diabetes. Max [name] agrees to continue Novolin 30 units TID and patient is to MyChart if necessary if his blood sugar is low. Max [name] agrees to follow up at the agreed upon time.  Hypertension We discussed sodium restriction, working on healthy weight loss, and a regular exercise program as the means to achieve improved blood pressure control. Max [name] agreed with this plan and agreed to follow up as directed. We will continue to monitor his blood pressure as well as his progress with the above lifestyle modifications. He will continue his current medications as prescribed (no refill needed) and will watch for signs of hypotension as he continues his lifestyle modifications.  Obesity Max [name] is currently in the action stage of change. As such, his goal is to continue with weight loss efforts He has agreed to follow the Category 3 plan Max [name] has been instructed  to work up to a goal of 150 minutes of combined cardio and strengthening exercise per week for weight loss and overall health benefits. We discussed the following Behavioral Modification Strategies today:  planning for success, keeping healthy foods in the home, increasing lean protein intake, increasing vegetables and work on meal planning and easy cooking plans  Max [name] has agreed to follow up with our clinic in 2 weeks. He was informed of the importance of frequent follow up visits to maximize his success with intensive lifestyle modifications for his multiple health conditions.  ALLERGIES: Allergies  Allergen Reactions  . Butane Anxiety and Other (See Comments)    Hallucinations REACTIONS ARE SIDE EFFECTS   . Gabapentin Other (See Comments)    Causes agression  . Hydromorphone Swelling    SWELLING REACTION UNSPECIFIED   . Tizanidine Swelling    SWELLING REACTION UNSPECIFIED   . Topamax [Topiramate] Other (See Comments)    Hallucinations   . Buprenorphine Other (See Comments)    UNSPECIFIED REACTION   . Pork-Derived Products     Pt. Refuses pork derived products  . Latex Rash    Localized rash    MEDICATIONS: Current Outpatient Medications on File Prior to Visit  Medication Sig Dispense Refill  . alfuzosin (UROXATRAL) 10 MG 24 hr tablet Take 10 mg by mouth daily with breakfast.    . amLODipine (NORVASC) 10 MG tablet Take 10 mg by mouth daily.     . Blood Glucose Monitoring Suppl (ONETOUCH VERIO) w/Device KIT 1 kit by Does not apply route daily at 12 noon. Continuous Glucose Monitoring Kit 1 kit 0  . chlorthalidone (HYGROTON) 25 MG tablet Take 1 tablet (25 mg total) by mouth daily. 90 tablet 0  . Cholecalciferol (D3-1000 PO) Take 1 capsule by mouth daily.    . DULoxetine (CYMBALTA) 60 MG capsule Take 60 mg by mouth 2 (two) times daily.    . ferrous sulfate 325 (65 FE) MG EC tablet Take 325 mg by mouth daily with breakfast.    . Flaxseed Oil OIL 750 mg daily.    Marland Kitchen glucose blood (ACCU-CHEK AVIVA) test strip Use as instructed 100 each 0  . hydrOXYzine (VISTARIL) 25 MG capsule   1  . Ibuprofen (ADVIL) 200 MG CAPS Take 2 capsules by mouth daily.    . Lancets (ACCU-CHEK SAFE-T PRO)  lancets Use as instructed 100 each 0  . Levorphanol Tartrate 3 MG TABS Take 3 mg by mouth QID.    Marland Kitchen losartan (COZAAR) 100 MG tablet Take 100 mg by mouth daily.    . Magnesium 400 MG TABS Take 1 tablet by mouth daily.    Marland Kitchen oxyCODONE (ROXICODONE) 15 MG immediate release tablet Take 10 mg by mouth 3 (three) times daily.     . Potassium 99 MG TABS Take 1 tablet by mouth daily.    . pravastatin (PRAVACHOL) 20 MG tablet Take 40 mg by mouth at bedtime.     . Probiotic Product (PROBIOTIC-10 PO) Take 1 capsule by mouth daily.    . tamsulosin (FLOMAX) 0.4 MG CAPS capsule Take 0.4 mg by mouth daily.     Marland Kitchen topiramate (TOPAMAX) 25 MG tablet Take 25 mg by mouth 2 (two) times daily.    . [name] 1.5 AG/5.3MI SOPN     . vitamin B-12 (CYANOCOBALAMIN) 1000 MCG tablet Take 1,000 mcg by mouth daily.    . naproxen sodium (ANAPROX) 220 MG tablet Take 220 mg by mouth daily as needed (for pain).  No current facility-administered medications on file prior to visit.     PAST MEDICAL HISTORY: Past Medical History:  Diagnosis Date  . ADD (attention deficit disorder)   . Anxiety   . Back pain   . Bipolar 1 disorder ([place])   . Chronic pain   . Constipation   . Decreased hearing   . Depression   . Diabetes mellitus without complication ([place])    Type II  . Dry skin   . DVT (deep venous thrombosis) ([place]) 2004   leg (not sure which leg)  . Dyslipidemia   . Dyspnea    with exertion  . Ear drainage   . Excessive hunger   . Excessive thirst   . Eye pain   . Fatigue   . Frequent urination   . Hard of hearing   . Hyperlipidemia   . Hypertension   . Itching   . Joint pain   . Lactose intolerance   . Leg cramps   . Mouth sores   . Nasal and sinus discharge   . Nausea   . Numbness and tingling    bilateral  . OSA (obstructive sleep apnea)    wears CPAP  . Shortness of breath   . Shortness of breath on exertion   . Sinus congestion   . Stiff neck   . Stress   . Swallowing difficulty   .  Swelling of extremity   . Tinnitus   . Trouble in sleeping   . Urinary frequency   . Vision changes   . Weakness   . Wears glasses     PAST SURGICAL HISTORY: Past Surgical History:  Procedure Laterality Date  . arm surgery Right   . BACK SURGERY  2004  . COLONOSCOPY    . ESOPHAGOGASTRODUODENOSCOPY    . EYE SURGERY Right    cataract  . SUBMANDIBULAR GLAND EXCISION Right 03/10/2016   Procedure: INTRAORAL  SUBMANDIBULAR  STONE REMOVAL;  Surgeon: Izora Gala, MD;  Location: [place];  Service: ENT;  Laterality: Right;    SOCIAL HISTORY: Social History   Tobacco Use  . Smoking status: Never Smoker  . Smokeless tobacco: Never Used  Substance Use Topics  . Alcohol use: No  . Drug use: No    FAMILY HISTORY: Family History  Problem Relation Age of Onset  . Diabetes Mother   . Hypertension Mother   . Hyperlipidemia Mother   . Kidney disease Mother   . Cancer Mother   . Diabetes Brother     ROS: Review of Systems  Constitutional: Positive for weight loss.  Cardiovascular: Negative for chest pain.       Negative for chest pressure  Neurological: Negative for headaches.  Endo/Heme/Allergies:       Positive for hypoglycemia    PHYSICAL EXAM: Pt in no acute distress  RECENT LABS AND TESTS: BMET    Component Value Date/Time   NA 139 05/15/2018 0946   K 4.1 05/15/2018 0946   CL 98 05/15/2018 0946   CO2 26 05/15/2018 0946   GLUCOSE 173 (H) 05/15/2018 0946   GLUCOSE 208 (H) 03/09/2016 1424   BUN 14 05/15/2018 0946   CREATININE 0.95 05/15/2018 0946   CALCIUM 9.2 05/15/2018 0946   GFRNONAA 81 05/15/2018 0946   GFRAA 93 05/15/2018 0946   Lab Results  Component Value Date   HGBA1C 7.7 (H) 05/15/2018   HGBA1C 8.3 (H) 09/28/2017   HGBA1C 7.2 (H) 03/09/2016   HGBA1C 7.9 08/12/2015   No results  found for: INSULIN CBC    Component Value Date/Time   WBC 10.8 09/28/2017 1145   WBC 11.6 (H) 03/09/2016 1424   RBC 5.32 09/28/2017 1145   RBC 5.17 03/09/2016 1424    HGB 13.8 09/28/2017 1145   HCT 42.3 09/28/2017 1145   PLT 291 03/09/2016 1424   MCV 80 09/28/2017 1145   MCH 25.9 (L) 09/28/2017 1145   MCH 26.7 03/09/2016 1424   MCHC 32.6 09/28/2017 1145   MCHC 33.3 03/09/2016 1424   RDW 14.6 09/28/2017 1145   LYMPHSABS 1.7 09/28/2017 1145   EOSABS 0.1 09/28/2017 1145   BASOSABS 0.0 09/28/2017 1145   Iron/TIBC/Ferritin/ %Sat No results found for: IRON, TIBC, FERRITIN, IRONPCTSAT Lipid Panel     Component Value Date/Time   CHOL 185 05/15/2018 0946   TRIG 99 05/15/2018 0946   HDL 45 05/15/2018 0946   CHOLHDL 3.8 Ratio 01/09/2008 2228   VLDL 29 01/09/2008 2228   LDLCALC 120 (H) 05/15/2018 0946   Hepatic Function Panel     Component Value Date/Time   PROT 6.6 05/15/2018 0946   ALBUMIN 4.2 05/15/2018 0946   AST 16 05/15/2018 0946   ALT 17 05/15/2018 0946   ALKPHOS 94 05/15/2018 0946   BILITOT 0.2 05/15/2018 0946      Component Value Date/Time   TSH 0.471 09/28/2017 1145   TSH 0.499 02/15/2008 2052   TSH 0.106 (L) 01/09/2008 2228     Ref. Range 09/28/2017 11:45  Vitamin D, 25-Hydroxy Latest Ref Range: 30.0 - 100.0 ng/mL 50.1    I, Doreene Nest, am acting as Location manager for Eber Jones, MD   I have reviewed the above documentation for accuracy and completeness, and I agree with the above. - Ilene Qua, MD

## 2018-10-26 ENCOUNTER — Telehealth (INDEPENDENT_AMBULATORY_CARE_PROVIDER_SITE_OTHER): Payer: Medicare Other | Admitting: Family Medicine

## 2018-10-26 ENCOUNTER — Encounter (INDEPENDENT_AMBULATORY_CARE_PROVIDER_SITE_OTHER): Payer: Self-pay | Admitting: Family Medicine

## 2018-10-26 ENCOUNTER — Other Ambulatory Visit: Payer: Self-pay

## 2018-10-26 DIAGNOSIS — E1165 Type 2 diabetes mellitus with hyperglycemia: Secondary | ICD-10-CM | POA: Diagnosis not present

## 2018-10-26 DIAGNOSIS — I10 Essential (primary) hypertension: Secondary | ICD-10-CM | POA: Diagnosis not present

## 2018-10-26 DIAGNOSIS — Z6841 Body Mass Index (BMI) 40.0 and over, adult: Secondary | ICD-10-CM

## 2018-10-26 DIAGNOSIS — Z794 Long term (current) use of insulin: Secondary | ICD-10-CM | POA: Diagnosis not present

## 2018-10-26 NOTE — Progress Notes (Signed)
Office: [phone]  /  Fax: [phone] TeleHealth Visit:  Max [name] has verbally consented to this TeleHealth visit today. The patient is located at home, the provider is located at the [employer] [facility] office. The participants in this visit include the listed provider and patient. Max [name] was unable to use realtime audiovisual technology today and the telehealth visit was conducted via telephone and lasted 12 minutes.   HPI:   Chief Complaint: OBESITY Max [name] is here to discuss his progress with his obesity treatment plan. He is on the Category 3 plan and is following his eating plan approximately 80-90 % of the time. He states he is exercising 0 minutes 0 times per week. Max [name] voices he has been limited in exercise secondary to heat and back pain. He has also had limited eating because of heat. His weight is of 268 lbs this morning. He has been monitoring his blood sugar at home. He is trying to avoid yogurt secondary to feeling bloated.  We were unable to weigh the patient today for this TeleHealth visit. He feels as if he has gained 1 lb since his last visit. He has lost 5 lbs since starting treatment with [name].  Diabetes II with Hyperglycemia Max [name] has a diagnosis of diabetes type II. Max [name] states is doing 30 units TID, and 40 units in the evening. He states his average BGs is 146 and fasting BGs range between 110 and 140's. He denies hypoglycemia. Last A1c was 7.7. He has been working on intensive lifestyle modifications including diet, exercise, and weight loss to help control his blood glucose levels.  Hypertension Max [name] is a 73 y.o. male with hypertension. Max [name] states his blood pressure is well controlled at home. His blood pressure was 120/70's today and he denies chest pain. He is working on weight loss to help control his blood pressure with the goal of decreasing his risk of heart attack and stroke.   ASSESSMENT AND PLAN:  Type 2  diabetes mellitus with hyperglycemia, with long-term current use of insulin (HCC)  Essential hypertension  Class 3 severe obesity with serious comorbidity and body mass index (BMI) of 40.0 to 44.9 in adult, unspecified obesity type ([place])  PLAN:  Diabetes II with Hyperglycemia Max [name] has been given extensive diabetes education by myself today including ideal fasting and post-prandial blood glucose readings, individual ideal Hgb A1c goals and hypoglycemia prevention. We discussed the importance of good blood sugar control to decrease the likelihood of diabetic complications such as nephropathy, neuropathy, limb loss, blindness, coronary artery disease, and death. We discussed the importance of intensive lifestyle modification including diet, exercise and weight loss as the first line treatment for diabetes. Max [name] agrees to continue his insulin dosage. We discussed treatment for hypoglycemia as well as encouraged him to reach out in case his blood sugar starts to drop. Max [name] agrees to follow up with our clinic in 2 weeks.  Hypertension We discussed sodium restriction, working on healthy weight loss, and a regular exercise program as the means to achieve improved blood pressure control. Max [name] agreed with this plan and agreed to follow up as directed. We will continue to monitor his blood pressure as well as his progress with the above lifestyle modifications. Max [name] agrees to continue his current medications and will watch for signs of hypotension as he continues his lifestyle modifications. Max [name] agrees to follow up with our clinic in 2 weeks.  Obesity Max [name] is currently in the action stage of change. As such, his  goal is to continue with weight loss efforts He has agreed to follow the Category 3 plan Max [name] has been instructed to work up to a goal of 150 minutes of combined cardio and strengthening exercise per week for weight loss and overall health benefits. We discussed the following  Behavioral Modification Strategies today: increasing lean protein intake, increasing vegetables and dealing with family or coworker sabotage, keeping healthy foods in the home, and planning for success   Max [name] has agreed to follow up with our clinic in 2 weeks. He was informed of the importance of frequent follow up visits to maximize his success with intensive lifestyle modifications for his multiple health conditions.  ALLERGIES: Allergies  Allergen Reactions  . Butane Anxiety and Other (See Comments)    Hallucinations REACTIONS ARE SIDE EFFECTS   . Gabapentin Other (See Comments)    Causes agression  . Hydromorphone Swelling    SWELLING REACTION UNSPECIFIED   . Tizanidine Swelling    SWELLING REACTION UNSPECIFIED   . Topamax [Topiramate] Other (See Comments)    Hallucinations   . Buprenorphine Other (See Comments)    UNSPECIFIED REACTION   . Pork-Derived Products     Pt. Refuses pork derived products  . Latex Rash    Localized rash    MEDICATIONS: Current Outpatient Medications on File Prior to Visit  Medication Sig Dispense Refill  . alfuzosin (UROXATRAL) 10 MG 24 hr tablet Take 10 mg by mouth daily with breakfast.    . amLODipine (NORVASC) 10 MG tablet Take 10 mg by mouth daily.     . Blood Glucose Monitoring Suppl (ONETOUCH VERIO) w/Device KIT 1 kit by Does not apply route daily at 12 noon. Continuous Glucose Monitoring Kit 1 kit 0  . chlorthalidone (HYGROTON) 25 MG tablet Take 1 tablet (25 mg total) by mouth daily. 90 tablet 0  . Cholecalciferol (D3-1000 PO) Take 1 capsule by mouth daily.    . DULoxetine (CYMBALTA) 60 MG capsule Take 60 mg by mouth 2 (two) times daily.    . ferrous sulfate 325 (65 FE) MG EC tablet Take 325 mg by mouth daily with breakfast.    . Flaxseed Oil OIL 750 mg daily.    Marland Kitchen glucose blood (ACCU-CHEK AVIVA) test strip Use as instructed 100 each 0  . hydrOXYzine (VISTARIL) 25 MG capsule   1  . Ibuprofen (ADVIL) 200 MG CAPS Take 2 capsules by mouth  daily.    . insulin regular (NOVOLIN R RELION) 100 units/mL injection Inject 0.3 mLs (30 Units total) into the skin 3 (three) times daily before meals. 27 mL 0  . Lancets (ACCU-CHEK SAFE-T PRO) lancets Use as instructed 100 each 0  . Levorphanol Tartrate 3 MG TABS Take 3 mg by mouth QID.    Marland Kitchen losartan (COZAAR) 100 MG tablet Take 100 mg by mouth daily.    . Magnesium 400 MG TABS Take 1 tablet by mouth daily.    . naproxen sodium (ANAPROX) 220 MG tablet Take 220 mg by mouth daily as needed (for pain).     Marland Kitchen oxyCODONE (ROXICODONE) 15 MG immediate release tablet Take 10 mg by mouth 3 (three) times daily.     . Potassium 99 MG TABS Take 1 tablet by mouth daily.    . pravastatin (PRAVACHOL) 20 MG tablet Take 40 mg by mouth at bedtime.     . Probiotic Product (PROBIOTIC-10 PO) Take 1 capsule by mouth daily.    . tamsulosin (FLOMAX) 0.4 MG CAPS capsule Take 0.4 mg  by mouth daily.     Marland Kitchen topiramate (TOPAMAX) 25 MG tablet Take 25 mg by mouth 2 (two) times daily.    . [name] 1.5 YB/0.1BP SOPN     . vitamin B-12 (CYANOCOBALAMIN) 1000 MCG tablet Take 1,000 mcg by mouth daily.     No current facility-administered medications on file prior to visit.     PAST MEDICAL HISTORY: Past Medical History:  Diagnosis Date  . ADD (attention deficit disorder)   . Anxiety   . Back pain   . Bipolar 1 disorder ([place])   . Chronic pain   . Constipation   . Decreased hearing   . Depression   . Diabetes mellitus without complication ([place])    Type II  . Dry skin   . DVT (deep venous thrombosis) ([place]) 2004   leg (not sure which leg)  . Dyslipidemia   . Dyspnea    with exertion  . Ear drainage   . Excessive hunger   . Excessive thirst   . Eye pain   . Fatigue   . Frequent urination   . Hard of hearing   . Hyperlipidemia   . Hypertension   . Itching   . Joint pain   . Lactose intolerance   . Leg cramps   . Mouth sores   . Nasal and sinus discharge   . Nausea   . Numbness and tingling    bilateral   . OSA (obstructive sleep apnea)    wears CPAP  . Shortness of breath   . Shortness of breath on exertion   . Sinus congestion   . Stiff neck   . Stress   . Swallowing difficulty   . Swelling of extremity   . Tinnitus   . Trouble in sleeping   . Urinary frequency   . Vision changes   . Weakness   . Wears glasses     PAST SURGICAL HISTORY: Past Surgical History:  Procedure Laterality Date  . arm surgery Right   . BACK SURGERY  2004  . COLONOSCOPY    . ESOPHAGOGASTRODUODENOSCOPY    . EYE SURGERY Right    cataract  . SUBMANDIBULAR GLAND EXCISION Right 03/10/2016   Procedure: INTRAORAL  SUBMANDIBULAR  STONE REMOVAL;  Surgeon: Izora Gala, MD;  Location: [facility];  Service: ENT;  Laterality: Right;    SOCIAL HISTORY: Social History   Tobacco Use  . Smoking status: Never Smoker  . Smokeless tobacco: Never Used  Substance Use Topics  . Alcohol use: No  . Drug use: No    FAMILY HISTORY: Family History  Problem Relation Age of Onset  . Diabetes Mother   . Hypertension Mother   . Hyperlipidemia Mother   . Kidney disease Mother   . Cancer Mother   . Diabetes Brother     ROS: Review of Systems  Constitutional: Negative for weight loss.  Cardiovascular: Negative for chest pain.  Endo/Heme/Allergies:       Negative hypoglycemia    PHYSICAL EXAM: Pt in no acute distress  RECENT LABS AND TESTS: BMET    Component Value Date/Time   NA 139 05/15/2018 0946   K 4.1 05/15/2018 0946   CL 98 05/15/2018 0946   CO2 26 05/15/2018 0946   GLUCOSE 173 (H) 05/15/2018 0946   GLUCOSE 208 (H) 03/09/2016 1424   BUN 14 05/15/2018 0946   CREATININE 0.95 05/15/2018 0946   CALCIUM 9.2 05/15/2018 0946   GFRNONAA 81 05/15/2018 0946   GFRAA 93 05/15/2018 0946  Lab Results  Component Value Date   HGBA1C 7.7 (H) 05/15/2018   HGBA1C 8.3 (H) 09/28/2017   HGBA1C 7.2 (H) 03/09/2016   HGBA1C 7.9 08/12/2015   No results found for: INSULIN CBC    Component Value Date/Time   WBC  10.8 09/28/2017 1145   WBC 11.6 (H) 03/09/2016 1424   RBC 5.32 09/28/2017 1145   RBC 5.17 03/09/2016 1424   HGB 13.8 09/28/2017 1145   HCT 42.3 09/28/2017 1145   PLT 291 03/09/2016 1424   MCV 80 09/28/2017 1145   MCH 25.9 (L) 09/28/2017 1145   MCH 26.7 03/09/2016 1424   MCHC 32.6 09/28/2017 1145   MCHC 33.3 03/09/2016 1424   RDW 14.6 09/28/2017 1145   LYMPHSABS 1.7 09/28/2017 1145   EOSABS 0.1 09/28/2017 1145   BASOSABS 0.0 09/28/2017 1145   Iron/TIBC/Ferritin/ %Sat No results found for: IRON, TIBC, FERRITIN, IRONPCTSAT Lipid Panel     Component Value Date/Time   CHOL 185 05/15/2018 0946   Max [name] 99 05/15/2018 0946   HDL 45 05/15/2018 0946   CHOLHDL 3.8 Ratio 01/09/2008 2228   VLDL 29 01/09/2008 2228   LDLCALC 120 (H) 05/15/2018 0946   Hepatic Function Panel     Component Value Date/Time   PROT 6.6 05/15/2018 0946   ALBUMIN 4.2 05/15/2018 0946   AST 16 05/15/2018 0946   ALT 17 05/15/2018 0946   ALKPHOS 94 05/15/2018 0946   BILITOT 0.2 05/15/2018 0946      Component Value Date/Time   TSH 0.471 09/28/2017 1145   TSH 0.499 02/15/2008 2052   TSH 0.106 (L) 01/09/2008 2228      I, Trixie Dredge, am acting as transcriptionist for Ilene Qua, MD  I have reviewed the above documentation for accuracy and completeness, and I agree with the above. - Ilene Qua, MD

## 2018-11-07 ENCOUNTER — Ambulatory Visit (INDEPENDENT_AMBULATORY_CARE_PROVIDER_SITE_OTHER): Payer: Medicare Other | Admitting: Family Medicine

## 2018-11-07 ENCOUNTER — Encounter (INDEPENDENT_AMBULATORY_CARE_PROVIDER_SITE_OTHER): Payer: Self-pay | Admitting: Family Medicine

## 2018-11-07 ENCOUNTER — Other Ambulatory Visit: Payer: Self-pay

## 2018-11-07 DIAGNOSIS — I10 Essential (primary) hypertension: Secondary | ICD-10-CM

## 2018-11-07 DIAGNOSIS — Z794 Long term (current) use of insulin: Secondary | ICD-10-CM | POA: Diagnosis not present

## 2018-11-07 DIAGNOSIS — E1165 Type 2 diabetes mellitus with hyperglycemia: Secondary | ICD-10-CM

## 2018-11-07 DIAGNOSIS — Z6841 Body Mass Index (BMI) 40.0 and over, adult: Secondary | ICD-10-CM

## 2018-11-07 MED ORDER — CHLORTHALIDONE 25 MG PO TABS: 25.0000 mg | ORAL_TABLET | Freq: Every day | ORAL | 0 refills | Status: DC

## 2018-11-08 NOTE — Progress Notes (Signed)
Office: [phone]  /  Fax: [phone] TeleHealth Visit:  Max [name] has verbally consented to this TeleHealth visit today. The patient is located at home, the provider is located at the [employer] [facility] office. The participants in this visit include the listed provider and patient, and the patient's wife, [name]. The visit was conducted today via telephone call.  HPI:   Chief Complaint: OBESITY Max [name] is here to discuss his progress with his obesity treatment plan. He is on the Category 3 plan and is following his eating plan approximately 90% of the time. He states he is walking 15 minutes 3 times per week. Max [name] reports some improvement in weight (slightly less than 264). He followed the plan most of the time and reports no hunger. He is doing sliced apples with peanut butter or cottage cheese with peaches. We were unable to weigh the patient today for this TeleHealth visit. He feels as if he has maintained his weight since his last visit. He has lost 5 lbs since starting treatment with [name].  Hypertension [name] is a 73 y.o. male with hypertension. Max [name] denies chest pain, chest pressure, or headache. He is working weight loss to help control his blood pressure with the goal of decreasing his risk of heart attack and stroke. Max [name]'s blood pressure is controlled at home (126/76).  Diabetes II with Hyperglycemia, on Insulin [name] has a diagnosis of diabetes type II. Max [name]'s blood sugar was 152 at the time of his visit. His fasting blood sugars are ranging and the meter is not giving reliable consistency of readings. Last A1c was 7.7 on 05/15/2018. He has been working on intensive lifestyle modifications including diet, exercise, and weight loss to help control his blood glucose levels.  ASSESSMENT AND PLAN:  Type 2 diabetes mellitus with hyperglycemia, with long-term current use of insulin (HCC)  Essential hypertension - Plan:  chlorthalidone (HYGROTON) 25 MG tablet  Class 3 severe obesity with serious comorbidity and body mass index (BMI) of 40.0 to 44.9 in adult, unspecified obesity type ([name])  PLAN:  Hypertension We discussed sodium restriction, working on healthy weight loss, and a regular exercise program as the means to achieve improved blood pressure control. [name] agreed with this plan and agreed to follow up as directed. We will continue to monitor his blood pressure as well as his progress with the above lifestyle modifications. [name] was given a refill on his chlorthalidone 25 mg #90 with 0 refills and he agrees to follow-up with our clinic in 2 weeks. He will watch for signs of hypotension as he continues his lifestyle modifications.  Diabetes II with Hyperglycemia, on Insulin Max [name] has been given extensive diabetes education by myself today including ideal fasting and post-prandial blood glucose readings, individual ideal HgA1c goals  and hypoglycemia prevention. We discussed the importance of good blood sugar control to decrease the likelihood of diabetic complications such as nephropathy, neuropathy, limb loss, blindness, coronary artery disease, and death. We discussed the importance of intensive lifestyle modification including diet, exercise and weight loss as the first line treatment for diabetes. Max [name] will have follow-up blood sugar at his next appointment.   Obesity Max [name] is currently in the action stage of change. As such, his goal is to continue with weight loss efforts. He has agreed to follow the Category 3 plan. Max [name] has been instructed to work up to a goal of 150 minutes of combined cardio and strengthening exercise per week for weight loss and overall  health benefits. We discussed the following Behavioral Modification Strategies today: increasing lean protein intake, increasing vegetables, work on meal planning and easy cooking plans, and planning for success.  Max [name] has agreed  to follow-up with our clinic in 2 weeks. He was informed of the importance of frequent follow-up visits to maximize his success with intensive lifestyle modifications for his multiple health conditions.  ALLERGIES: Allergies  Allergen Reactions  . Butane Anxiety and Other (See Comments)    Hallucinations REACTIONS ARE SIDE EFFECTS   . Gabapentin Other (See Comments)    Causes agression  . Hydromorphone Swelling    SWELLING REACTION UNSPECIFIED   . Tizanidine Swelling    SWELLING REACTION UNSPECIFIED   . Topamax [Topiramate] Other (See Comments)    Hallucinations   . Buprenorphine Other (See Comments)    UNSPECIFIED REACTION   . Pork-Derived Products     Pt. Refuses pork derived products  . Latex Rash    Localized rash    MEDICATIONS: Current Outpatient Medications on File Prior to Visit  Medication Sig Dispense Refill  . alfuzosin (UROXATRAL) 10 MG 24 hr tablet Take 10 mg by mouth daily with breakfast.    . amLODipine (NORVASC) 10 MG tablet Take 10 mg by mouth daily.     . Blood Glucose Monitoring Suppl (ONETOUCH VERIO) w/Device KIT 1 kit by Does not apply route daily at 12 noon. Continuous Glucose Monitoring Kit 1 kit 0  . Cholecalciferol (D3-1000 PO) Take 1 capsule by mouth daily.    . DULoxetine (CYMBALTA) 60 MG capsule Take 60 mg by mouth 2 (two) times daily.    . ferrous sulfate 325 (65 FE) MG EC tablet Take 325 mg by mouth daily with breakfast.    . Flaxseed Oil OIL 750 mg daily.    Marland Kitchen glucose blood (ACCU-CHEK AVIVA) test strip Use as instructed 100 each 0  . hydrOXYzine (VISTARIL) 25 MG capsule   1  . Ibuprofen (ADVIL) 200 MG CAPS Take 2 capsules by mouth daily.    . insulin regular (NOVOLIN R RELION) 100 units/mL injection Inject 0.3 mLs (30 Units total) into the skin 3 (three) times daily before meals. 27 mL 0  . Lancets (ACCU-CHEK SAFE-T PRO) lancets Use as instructed 100 each 0  . Levorphanol Tartrate 3 MG TABS Take 3 mg by mouth QID.    Marland Kitchen losartan (COZAAR) 100 MG  tablet Take 100 mg by mouth daily.    . Magnesium 400 MG TABS Take 1 tablet by mouth daily.    . naproxen sodium (ANAPROX) 220 MG tablet Take 220 mg by mouth daily as needed (for pain).     Marland Kitchen oxyCODONE (ROXICODONE) 15 MG immediate release tablet Take 10 mg by mouth 3 (three) times daily.     . Potassium 99 MG TABS Take 1 tablet by mouth daily.    . pravastatin (PRAVACHOL) 20 MG tablet Take 40 mg by mouth at bedtime.     . Probiotic Product (PROBIOTIC-10 PO) Take 1 capsule by mouth daily.    . tamsulosin (FLOMAX) 0.4 MG CAPS capsule Take 0.4 mg by mouth daily.     Marland Kitchen topiramate (TOPAMAX) 25 MG tablet Take 25 mg by mouth 2 (two) times daily.    . [name] 1.5 ZT/2.4PY SOPN     . vitamin B-12 (CYANOCOBALAMIN) 1000 MCG tablet Take 1,000 mcg by mouth daily.     No current facility-administered medications on file prior to visit.     PAST MEDICAL HISTORY: Past Medical History:  Diagnosis Date  . ADD (attention deficit disorder)   . Anxiety   . Back pain   . Bipolar 1 disorder ([place])   . Chronic pain   . Constipation   . Decreased hearing   . Depression   . Diabetes mellitus without complication ([place])    Type II  . Dry skin   . DVT (deep venous thrombosis) ([place]) 2004   leg (not sure which leg)  . Dyslipidemia   . Dyspnea    with exertion  . Ear drainage   . Excessive hunger   . Excessive thirst   . Eye pain   . Fatigue   . Frequent urination   . Hard of hearing   . Hyperlipidemia   . Hypertension   . Itching   . Joint pain   . Lactose intolerance   . Leg cramps   . Mouth sores   . Nasal and sinus discharge   . Nausea   . Numbness and tingling    bilateral  . OSA (obstructive sleep apnea)    wears CPAP  . Shortness of breath   . Shortness of breath on exertion   . Sinus congestion   . Stiff neck   . Stress   . Swallowing difficulty   . Swelling of extremity   . Tinnitus   . Trouble in sleeping   . Urinary frequency   . Vision changes   . Weakness   . Wears  glasses     PAST SURGICAL HISTORY: Past Surgical History:  Procedure Laterality Date  . arm surgery Right   . BACK SURGERY  2004  . COLONOSCOPY    . ESOPHAGOGASTRODUODENOSCOPY    . EYE SURGERY Right    cataract  . SUBMANDIBULAR GLAND EXCISION Right 03/10/2016   Procedure: INTRAORAL  SUBMANDIBULAR  STONE REMOVAL;  Surgeon: Izora Gala, MD;  Location: [facility];  Service: ENT;  Laterality: Right;    SOCIAL HISTORY: Social History   Tobacco Use  . Smoking status: Never Smoker  . Smokeless tobacco: Never Used  Substance Use Topics  . Alcohol use: No  . Drug use: No    FAMILY HISTORY: Family History  Problem Relation Age of Onset  . Diabetes Mother   . Hypertension Mother   . Hyperlipidemia Mother   . Kidney disease Mother   . Cancer Mother   . Diabetes Brother    ROS: Review of Systems  Cardiovascular: Negative for chest pain.       Negative for chest pressure.  Neurological: Negative for headaches.   PHYSICAL EXAM: Pt in no acute distress  RECENT LABS AND TESTS: BMET    Component Value Date/Time   NA 139 05/15/2018 0946   K 4.1 05/15/2018 0946   CL 98 05/15/2018 0946   CO2 26 05/15/2018 0946   GLUCOSE 173 (H) 05/15/2018 0946   GLUCOSE 208 (H) 03/09/2016 1424   BUN 14 05/15/2018 0946   CREATININE 0.95 05/15/2018 0946   CALCIUM 9.2 05/15/2018 0946   GFRNONAA 81 05/15/2018 0946   GFRAA 93 05/15/2018 0946   Lab Results  Component Value Date   HGBA1C 7.7 (H) 05/15/2018   HGBA1C 8.3 (H) 09/28/2017   HGBA1C 7.2 (H) 03/09/2016   HGBA1C 7.9 08/12/2015   No results found for: INSULIN CBC    Component Value Date/Time   WBC 10.8 09/28/2017 1145   WBC 11.6 (H) 03/09/2016 1424   RBC 5.32 09/28/2017 1145   RBC 5.17 03/09/2016 1424   HGB 13.8  09/28/2017 1145   HCT 42.3 09/28/2017 1145   PLT 291 03/09/2016 1424   MCV 80 09/28/2017 1145   MCH 25.9 (L) 09/28/2017 1145   MCH 26.7 03/09/2016 1424   MCHC 32.6 09/28/2017 1145   MCHC 33.3 03/09/2016 1424   RDW  14.6 09/28/2017 1145   LYMPHSABS 1.7 09/28/2017 1145   EOSABS 0.1 09/28/2017 1145   BASOSABS 0.0 09/28/2017 1145   Iron/TIBC/Ferritin/ %Sat No results found for: IRON, TIBC, FERRITIN, IRONPCTSAT Lipid Panel     Component Value Date/Time   CHOL 185 05/15/2018 0946   TRIG 99 05/15/2018 0946   HDL 45 05/15/2018 0946   CHOLHDL 3.8 Ratio 01/09/2008 2228   VLDL 29 01/09/2008 2228   LDLCALC 120 (H) 05/15/2018 0946   Hepatic Function Panel     Component Value Date/Time   PROT 6.6 05/15/2018 0946   ALBUMIN 4.2 05/15/2018 0946   AST 16 05/15/2018 0946   ALT 17 05/15/2018 0946   ALKPHOS 94 05/15/2018 0946   BILITOT 0.2 05/15/2018 0946      Component Value Date/Time   TSH 0.471 09/28/2017 1145   TSH 0.499 02/15/2008 2052   TSH 0.106 (L) 01/09/2008 2228   Results for TALOR, CHEEMA A (MRN [id number]) as of 11/08/2018 16:19  Ref. Range 09/28/2017 11:45  Vitamin D, 25-Hydroxy Latest Ref Range: 30.0 - 100.0 ng/mL 50.1   I, Michaelene Song, am acting as Location manager for Ilene Qua, MD  I have reviewed the above documentation for accuracy and completeness, and I agree with the above. - Ilene Qua, MD

## 2018-11-23 ENCOUNTER — Telehealth (INDEPENDENT_AMBULATORY_CARE_PROVIDER_SITE_OTHER): Payer: Medicare Other | Admitting: Family Medicine

## 2018-11-23 ENCOUNTER — Other Ambulatory Visit: Payer: Self-pay

## 2018-11-23 ENCOUNTER — Encounter (INDEPENDENT_AMBULATORY_CARE_PROVIDER_SITE_OTHER): Payer: Self-pay | Admitting: Family Medicine

## 2018-11-23 DIAGNOSIS — E1165 Type 2 diabetes mellitus with hyperglycemia: Secondary | ICD-10-CM

## 2018-11-23 DIAGNOSIS — Z794 Long term (current) use of insulin: Secondary | ICD-10-CM | POA: Diagnosis not present

## 2018-11-23 DIAGNOSIS — Z6841 Body Mass Index (BMI) 40.0 and over, adult: Secondary | ICD-10-CM

## 2018-11-23 DIAGNOSIS — I10 Essential (primary) hypertension: Secondary | ICD-10-CM | POA: Diagnosis not present

## 2018-11-27 NOTE — Progress Notes (Signed)
Office: [phone]  /  Fax: [phone] TeleHealth Visit:  Max [name] has verbally consented to this TeleHealth visit today. The patient is located at home, the provider is located at the [employer] [facility] office. The participants in this visit include the listed provider and patient. [name] was unable to use realtime audiovisual technology today and the telehealth visit was conducted via telephone.   HPI:   Chief Complaint: OBESITY Max [name] is here to discuss his progress with his obesity treatment plan. He is on the Category 3 plan and is following his eating plan approximately 60 % of the time. He states he is walking for 20-30 minutes 3-4 times per week. Max [name] has been eating as on the plan as possible. He would like to get more mobile but he has an increase in pain with the weather changes recently. He didn't consistently eat boiled eggs and toast in the morning, and not eating all of the meals occasionally. His weight is of 264 lbs today.  We were unable to weigh the patient today for this TeleHealth visit. He feels as if he has maintained his weight since his last visit. He has lost 5 lbs since starting treatment with [name].  Diabetes II with Hyperglycemia Max [name] has a diagnosis of diabetes type II. Max [name] states his fasting BGs range between 160 and 170's. He denies hypoglycemia. He mentions inconsistency in eating, and is still experiencing inconsistent with monitoring BGs. He is taking Novolin 30 units TID and [name]. Last A1c was 7.7. He has been working on intensive lifestyle modifications including diet, exercise, and weight loss to help control his blood glucose levels.  Hypertension Max [name] is a 73 y.o. male with hypertension. [name]'s blood pressure was 11/62 today. He denies chest pain, chest pressure, or headaches. He is working on weight loss to help control his blood pressure with the goal of decreasing his risk of heart attack and stroke.    ASSESSMENT AND PLAN:  Type 2 diabetes mellitus with hyperglycemia, with long-term current use of insulin (HCC)  Essential hypertension  Class 3 severe obesity with serious comorbidity and body mass index (BMI) of 40.0 to 44.9 in adult, unspecified obesity type ([place])  PLAN:  Diabetes II with Hyperglycemia Max [name] has been given extensive diabetes education by myself today including ideal fasting and post-prandial blood glucose readings, individual ideal Hgb A1c goals  and hypoglycemia prevention. We discussed the importance of good blood sugar control to decrease the likelihood of diabetic complications such as nephropathy, neuropathy, limb loss, blindness, coronary artery disease, and death. We discussed the importance of intensive lifestyle modification including diet, exercise and weight loss as the first line treatment for diabetes. [name] agrees to continue his current diabetes medications, and he is to continue checking his blood sugar TID. Max [name] agrees to follow up with our clinic in 2 weeks.  Hypertension We discussed sodium restriction, working on healthy weight loss, and a regular exercise program as the means to achieve improved blood pressure control. Max [name] agreed with this plan and agreed to follow up as directed. We will continue to monitor his blood pressure as well as his progress with the above lifestyle modifications. Max [name] agrees to continue his current medications, no change and will watch for signs of hypotension as he continues his lifestyle modifications. Max [name] agrees to follow up with our clinic in 2 weeks.  Obesity Max [name] is currently in the action stage of change. As such, his goal is to continue with weight loss efforts  He has agreed to follow the Category 3 plan Max [name] has been instructed to work up to a goal of 150 minutes of combined cardio and strengthening exercise per week for weight loss and overall health benefits. We discussed the following  Behavioral Modification Strategies today: increasing lean protein intake, increasing vegetables and work on meal planning and easy cooking plans, keeping healthy foods in the home, and planning for success   Max [name] has agreed to follow up with our clinic in 2 weeks. He was informed of the importance of frequent follow up visits to maximize his success with intensive lifestyle modifications for his multiple health conditions.  ALLERGIES: Allergies  Allergen Reactions  . Butane Anxiety and Other (See Comments)    Hallucinations REACTIONS ARE SIDE EFFECTS   . Gabapentin Other (See Comments)    Causes agression  . Hydromorphone Swelling    SWELLING REACTION UNSPECIFIED   . Tizanidine Swelling    SWELLING REACTION UNSPECIFIED   . Topamax [Topiramate] Other (See Comments)    Hallucinations   . Buprenorphine Other (See Comments)    UNSPECIFIED REACTION   . Pork-Derived Products     Pt. Refuses pork derived products  . Latex Rash    Localized rash    MEDICATIONS: Current Outpatient Medications on File Prior to Visit  Medication Sig Dispense Refill  . alfuzosin (UROXATRAL) 10 MG 24 hr tablet Take 10 mg by mouth daily with breakfast.    . amLODipine (NORVASC) 10 MG tablet Take 10 mg by mouth daily.     . Blood Glucose Monitoring Suppl (ONETOUCH VERIO) w/Device KIT 1 kit by Does not apply route daily at 12 noon. Continuous Glucose Monitoring Kit 1 kit 0  . chlorthalidone (HYGROTON) 25 MG tablet Take 1 tablet (25 mg total) by mouth daily. 90 tablet 0  . Cholecalciferol (D3-1000 PO) Take 1 capsule by mouth daily.    . DULoxetine (CYMBALTA) 60 MG capsule Take 60 mg by mouth 2 (two) times daily.    . ferrous sulfate 325 (65 FE) MG EC tablet Take 325 mg by mouth daily with breakfast.    . Flaxseed Oil OIL 750 mg daily.    Marland Kitchen glucose blood (ACCU-CHEK AVIVA) test strip Use as instructed 100 each 0  . hydrOXYzine (VISTARIL) 25 MG capsule   1  . Ibuprofen (ADVIL) 200 MG CAPS Take 2 capsules by  mouth daily.    . Lancets (ACCU-CHEK SAFE-T PRO) lancets Use as instructed 100 each 0  . Levorphanol Tartrate 3 MG TABS Take 3 mg by mouth QID.    Marland Kitchen losartan (COZAAR) 100 MG tablet Take 100 mg by mouth daily.    . Magnesium 400 MG TABS Take 1 tablet by mouth daily.    . naproxen sodium (ANAPROX) 220 MG tablet Take 220 mg by mouth daily as needed (for pain).     Marland Kitchen oxyCODONE (ROXICODONE) 15 MG immediate release tablet Take 10 mg by mouth 3 (three) times daily.     . Potassium 99 MG TABS Take 1 tablet by mouth daily.    . pravastatin (PRAVACHOL) 20 MG tablet Take 40 mg by mouth at bedtime.     . Probiotic Product (PROBIOTIC-10 PO) Take 1 capsule by mouth daily.    . tamsulosin (FLOMAX) 0.4 MG CAPS capsule Take 0.4 mg by mouth daily.     Marland Kitchen topiramate (TOPAMAX) 25 MG tablet Take 25 mg by mouth 2 (two) times daily.    . [name] 1.5 JH/4.1DE SOPN     .  vitamin B-12 (CYANOCOBALAMIN) 1000 MCG tablet Take 1,000 mcg by mouth daily.    . insulin regular (NOVOLIN R RELION) 100 units/mL injection Inject 0.3 mLs (30 Units total) into the skin 3 (three) times daily before meals. 27 mL 0   No current facility-administered medications on file prior to visit.     PAST MEDICAL HISTORY: Past Medical History:  Diagnosis Date  . ADD (attention deficit disorder)   . Anxiety   . Back pain   . Bipolar 1 disorder ([place])   . Chronic pain   . Constipation   . Decreased hearing   . Depression   . Diabetes mellitus without complication ([place])    Type II  . Dry skin   . DVT (deep venous thrombosis) ([place]) 2004   leg (not sure which leg)  . Dyslipidemia   . Dyspnea    with exertion  . Ear drainage   . Excessive hunger   . Excessive thirst   . Eye pain   . Fatigue   . Frequent urination   . Hard of hearing   . Hyperlipidemia   . Hypertension   . Itching   . Joint pain   . Lactose intolerance   . Leg cramps   . Mouth sores   . Nasal and sinus discharge   . Nausea   . Numbness and tingling     bilateral  . OSA (obstructive sleep apnea)    wears CPAP  . Shortness of breath   . Shortness of breath on exertion   . Sinus congestion   . Stiff neck   . Stress   . Swallowing difficulty   . Swelling of extremity   . Tinnitus   . Trouble in sleeping   . Urinary frequency   . Vision changes   . Weakness   . Wears glasses     PAST SURGICAL HISTORY: Past Surgical History:  Procedure Laterality Date  . arm surgery Right   . BACK SURGERY  2004  . COLONOSCOPY    . ESOPHAGOGASTRODUODENOSCOPY    . EYE SURGERY Right    cataract  . SUBMANDIBULAR GLAND EXCISION Right 03/10/2016   Procedure: INTRAORAL  SUBMANDIBULAR  STONE REMOVAL;  Surgeon: Izora Gala, MD;  Location: [facility];  Service: ENT;  Laterality: Right;    SOCIAL HISTORY: Social History   Tobacco Use  . Smoking status: Never Smoker  . Smokeless tobacco: Never Used  Substance Use Topics  . Alcohol use: No  . Drug use: No    FAMILY HISTORY: Family History  Problem Relation Age of Onset  . Diabetes Mother   . Hypertension Mother   . Hyperlipidemia Mother   . Kidney disease Mother   . Cancer Mother   . Diabetes Brother     ROS: Review of Systems  Constitutional: Negative for weight loss.  Cardiovascular: Negative for chest pain.       Negative chest pressure  Neurological: Negative for headaches.  Endo/Heme/Allergies:       Negative hypoglycemia    PHYSICAL EXAM: Pt in no acute distress  RECENT LABS AND TESTS: BMET    Component Value Date/Time   NA 139 05/15/2018 0946   K 4.1 05/15/2018 0946   CL 98 05/15/2018 0946   CO2 26 05/15/2018 0946   GLUCOSE 173 (H) 05/15/2018 0946   GLUCOSE 208 (H) 03/09/2016 1424   BUN 14 05/15/2018 0946   CREATININE 0.95 05/15/2018 0946   CALCIUM 9.2 05/15/2018 0946   GFRNONAA 81 05/15/2018  [place] 05/15/2018 0946   Lab Results  Component Value Date   HGBA1C 7.7 (H) 05/15/2018   HGBA1C 8.3 (H) 09/28/2017   HGBA1C 7.2 (H) 03/09/2016   HGBA1C 7.9  08/12/2015   No results found for: INSULIN CBC    Component Value Date/Time   WBC 10.8 09/28/2017 1145   WBC 11.6 (H) 03/09/2016 1424   RBC 5.32 09/28/2017 1145   RBC 5.17 03/09/2016 1424   HGB 13.8 09/28/2017 1145   HCT 42.3 09/28/2017 1145   PLT 291 03/09/2016 1424   MCV 80 09/28/2017 1145   MCH 25.9 (L) 09/28/2017 1145   MCH 26.7 03/09/2016 1424   MCHC 32.6 09/28/2017 1145   MCHC 33.3 03/09/2016 1424   RDW 14.6 09/28/2017 1145   LYMPHSABS 1.7 09/28/2017 1145   EOSABS 0.1 09/28/2017 1145   BASOSABS 0.0 09/28/2017 1145   Iron/TIBC/Ferritin/ %Sat No results found for: IRON, TIBC, FERRITIN, IRONPCTSAT Lipid Panel     Component Value Date/Time   CHOL 185 05/15/2018 0946   TRIG 99 05/15/2018 0946   HDL 45 05/15/2018 0946   CHOLHDL 3.8 Ratio 01/09/2008 2228   VLDL 29 01/09/2008 2228   LDLCALC 120 (H) 05/15/2018 0946   Hepatic Function Panel     Component Value Date/Time   PROT 6.6 05/15/2018 0946   ALBUMIN 4.2 05/15/2018 0946   AST 16 05/15/2018 0946   ALT 17 05/15/2018 0946   ALKPHOS 94 05/15/2018 0946   BILITOT 0.2 05/15/2018 0946      Component Value Date/Time   TSH 0.471 09/28/2017 1145   TSH 0.499 02/15/2008 2052   TSH 0.106 (L) 01/09/2008 2228      I, Trixie Dredge, am acting as transcriptionist for Ilene Qua, MD  I have reviewed the above documentation for accuracy and completeness, and I agree with the above. - Ilene Qua, MD

## 2018-12-07 ENCOUNTER — Ambulatory Visit (INDEPENDENT_AMBULATORY_CARE_PROVIDER_SITE_OTHER): Payer: Medicare Other | Admitting: Family Medicine

## 2018-12-07 ENCOUNTER — Encounter (INDEPENDENT_AMBULATORY_CARE_PROVIDER_SITE_OTHER): Payer: Self-pay | Admitting: Family Medicine

## 2018-12-07 ENCOUNTER — Other Ambulatory Visit: Payer: Self-pay

## 2018-12-07 DIAGNOSIS — E1165 Type 2 diabetes mellitus with hyperglycemia: Secondary | ICD-10-CM | POA: Diagnosis not present

## 2018-12-07 DIAGNOSIS — Z794 Long term (current) use of insulin: Secondary | ICD-10-CM

## 2018-12-07 DIAGNOSIS — I10 Essential (primary) hypertension: Secondary | ICD-10-CM

## 2018-12-07 DIAGNOSIS — Z6838 Body mass index (BMI) 38.0-38.9, adult: Secondary | ICD-10-CM

## 2018-12-11 NOTE — Progress Notes (Signed)
Office: [phone]  /  Fax: [phone] TeleHealth Visit:  Max [name] has verbally consented to this TeleHealth visit today. The patient is located at home, the provider is located at the [employer] [facility] office. The participants in this visit include the listed provider and patient. Max [name] was unable to use realtime audiovisual technology today and the telehealth visit was conducted via telephone.   HPI:   Chief Complaint: OBESITY Max [name] is here to discuss his progress with his obesity treatment plan. He is on the Category 3 plan and is following his eating plan approximately 50 % of the time. He states he is walking for 30 minutes 3 times per week. Max [name]'s weight was of 264 lbs last night (no change since last appointment). He has had increase in pain recently (unsure why but wondering if it's change in barometric pressure). He is not eating much carbohydrates. We were unable to weigh the patient today for this TeleHealth visit. He feels as if he has maintained his weight since his last visit. He has lost 5 lbs since starting treatment with Max [name].  Diabetes II with Hyperglycemia Max [name] has a diagnosis of diabetes type II. Max [name] states his blood sugars are averaging at 135. His low blood sugars range between 100 and 110's and hight blood sugars average in mid 180's. He is taking 30 units-40 units. He denies hypoglycemia. Last A1c was 7.7. He has been working on intensive lifestyle modifications including diet, exercise, and weight loss to help control his blood glucose levels.  Hypertension Max [name] is a 73 y.o. male with hypertension. Max [name]'s blood pressure is controlled most of the time except with increased pain. He denies chest pain, chest pressure, or headaches. He is working on weight loss to help control his blood pressure with the goal of decreasing his risk of heart attack and stroke.   ASSESSMENT AND PLAN:  Type 2 diabetes mellitus with  hyperglycemia, with long-term current use of insulin (HCC) - Plan: Comprehensive metabolic panel, Hemoglobin A1c, Lipid Panel With LDL/HDL Ratio, C-peptide  Essential hypertension  Class 2 severe obesity with serious comorbidity and body mass index (BMI) of 38.0 to 38.9 in adult, unspecified obesity type (HCC)  PLAN:  Diabetes II with Hyperglycemia Max [name] has been given extensive diabetes education by myself today including ideal fasting and post-prandial blood glucose readings, individual ideal Hgb A1c goals and hypoglycemia prevention. We discussed the importance of good blood sugar control to decrease the likelihood of diabetic complications such as nephropathy, neuropathy, limb loss, blindness, coronary artery disease, and death. We discussed the importance of intensive lifestyle modification including diet, exercise and weight loss as the first line treatment for diabetes. Max [name] agrees to continue his diabetes medications, and we will check Hgb A1c, C-peptide, and FLP today. Max [name] agrees to follow up with our clinic in 2 weeks.  Hypertension We discussed sodium restriction, working on healthy weight loss, and a regular exercise program as the means to achieve improved blood pressure control. Max [name] agreed with this plan and agreed to follow up as directed. We will continue to monitor his blood pressure as well as his progress with the above lifestyle modifications. Max [name] agrees to continue his medications and will watch for signs of hypotension as he continues his lifestyle modifications. We will check CMP today. Max [name] agrees to follow up with our clinic in 2 weeks.  Obesity Max [name] is currently in the action stage of change. As such, his goal is to continue with weight loss efforts  He has agreed to follow the Category 3 plan Max [name] has been instructed to work up to a goal of 150 minutes of combined cardio and strengthening exercise per week for weight loss and overall health  benefits. We discussed the following Behavioral Modification Strategies today: increasing lean protein intake, increasing vegetables and work on meal planning and easy cooking plans, keeping healthy foods in the home, and planning for success   Max [name] has agreed to follow up with our clinic in 2 weeks. He was informed of the importance of frequent follow up visits to maximize his success with intensive lifestyle modifications for his multiple health conditions.  ALLERGIES: Allergies  Allergen Reactions  . Butane Anxiety and Other (See Comments)    Hallucinations REACTIONS ARE SIDE EFFECTS   . Gabapentin Other (See Comments)    Causes agression  . Hydromorphone Swelling    SWELLING REACTION UNSPECIFIED   . Tizanidine Swelling    SWELLING REACTION UNSPECIFIED   . Topamax [Topiramate] Other (See Comments)    Hallucinations   . Buprenorphine Other (See Comments)    UNSPECIFIED REACTION   . Pork-Derived Products     Pt. Refuses pork derived products  . Latex Rash    Localized rash    MEDICATIONS: Current Outpatient Medications on File Prior to Visit  Medication Sig Dispense Refill  . alfuzosin (UROXATRAL) 10 MG 24 hr tablet Take 10 mg by mouth daily with breakfast.    . amLODipine (NORVASC) 10 MG tablet Take 10 mg by mouth daily.     . Blood Glucose Monitoring Suppl (ONETOUCH VERIO) w/Device KIT 1 kit by Does not apply route daily at 12 noon. Continuous Glucose Monitoring Kit 1 kit 0  . chlorthalidone (HYGROTON) 25 MG tablet Take 1 tablet (25 mg total) by mouth daily. 90 tablet 0  . Cholecalciferol (D3-1000 PO) Take 1 capsule by mouth daily.    . DULoxetine (CYMBALTA) 60 MG capsule Take 60 mg by mouth 2 (two) times daily.    . ferrous sulfate 325 (65 FE) MG EC tablet Take 325 mg by mouth daily with breakfast.    . Flaxseed Oil OIL 750 mg daily.    Marland Kitchen glucose blood (ACCU-CHEK AVIVA) test strip Use as instructed 100 each 0  . hydrOXYzine (VISTARIL) 25 MG capsule   1  . Lancets  (ACCU-CHEK SAFE-T PRO) lancets Use as instructed 100 each 0  . Levorphanol Tartrate 3 MG TABS Take 3 mg by mouth QID.    Marland Kitchen losartan (COZAAR) 100 MG tablet Take 100 mg by mouth daily.    . Magnesium 400 MG TABS Take 1 tablet by mouth daily.    . naproxen sodium (ANAPROX) 220 MG tablet Take 220 mg by mouth daily as needed (for pain).     Marland Kitchen oxyCODONE (ROXICODONE) 15 MG immediate release tablet Take 10 mg by mouth 3 (three) times daily.     . Potassium 99 MG TABS Take 1 tablet by mouth daily.    . pravastatin (PRAVACHOL) 20 MG tablet Take 40 mg by mouth at bedtime.     . Probiotic Product (PROBIOTIC-10 PO) Take 1 capsule by mouth daily.    . tamsulosin (FLOMAX) 0.4 MG CAPS capsule Take 0.4 mg by mouth daily.     Marland Kitchen topiramate (TOPAMAX) 25 MG tablet Take 25 mg by mouth 2 (two) times daily.    . [name] 1.5 RK/2.7CW SOPN     . vitamin B-12 (CYANOCOBALAMIN) 1000 MCG tablet Take 1,000 mcg by mouth daily.    Marland Kitchen  Ibuprofen (ADVIL) 200 MG CAPS Take 2 capsules by mouth daily.    . insulin regular (NOVOLIN R RELION) 100 units/mL injection Inject 0.3 mLs (30 Units total) into the skin 3 (three) times daily before meals. 27 mL 0   No current facility-administered medications on file prior to visit.     PAST MEDICAL HISTORY: Past Medical History:  Diagnosis Date  . ADD (attention deficit disorder)   . Anxiety   . Back pain   . Bipolar 1 disorder ([place])   . Chronic pain   . Constipation   . Decreased hearing   . Depression   . Diabetes mellitus without complication ([place])    Type II  . Dry skin   . DVT (deep venous thrombosis) ([place]) 2004   leg (not sure which leg)  . Dyslipidemia   . Dyspnea    with exertion  . Ear drainage   . Excessive hunger   . Excessive thirst   . Eye pain   . Fatigue   . Frequent urination   . Hard of hearing   . Hyperlipidemia   . Hypertension   . Itching   . Joint pain   . Lactose intolerance   . Leg cramps   . Mouth sores   . Nasal and sinus discharge   .  Nausea   . Numbness and tingling    bilateral  . OSA (obstructive sleep apnea)    wears CPAP  . Shortness of breath   . Shortness of breath on exertion   . Sinus congestion   . Stiff neck   . Stress   . Swallowing difficulty   . Swelling of extremity   . Tinnitus   . Trouble in sleeping   . Urinary frequency   . Vision changes   . Weakness   . Wears glasses     PAST SURGICAL HISTORY: Past Surgical History:  Procedure Laterality Date  . arm surgery Right   . BACK SURGERY  2004  . COLONOSCOPY    . ESOPHAGOGASTRODUODENOSCOPY    . EYE SURGERY Right    cataract  . SUBMANDIBULAR GLAND EXCISION Right 03/10/2016   Procedure: INTRAORAL  SUBMANDIBULAR  STONE REMOVAL;  Surgeon: Izora Gala, MD;  Location: [facility];  Service: ENT;  Laterality: Right;    SOCIAL HISTORY: Social History   Tobacco Use  . Smoking status: Never Smoker  . Smokeless tobacco: Never Used  Substance Use Topics  . Alcohol use: No  . Drug use: No    FAMILY HISTORY: Family History  Problem Relation Age of Onset  . Diabetes Mother   . Hypertension Mother   . Hyperlipidemia Mother   . Kidney disease Mother   . Cancer Mother   . Diabetes Brother     ROS: Review of Systems  Constitutional: Negative for weight loss.  Cardiovascular: Negative for chest pain.       Negative chest pressure  Musculoskeletal:       Negative muscle weakness  Neurological: Negative for headaches.  Endo/Heme/Allergies:       Negative hypoglycemia    PHYSICAL EXAM: Pt in no acute distress  RECENT LABS AND TESTS: BMET    Component Value Date/Time   NA 139 05/15/2018 0946   K 4.1 05/15/2018 0946   CL 98 05/15/2018 0946   CO2 26 05/15/2018 0946   GLUCOSE 173 (H) 05/15/2018 0946   GLUCOSE 208 (H) 03/09/2016 1424   BUN 14 05/15/2018 0946   CREATININE 0.95 05/15/2018 0946  CALCIUM 9.2 05/15/2018 0946   GFRNONAA 81 05/15/2018 0946   GFRAA 93 05/15/2018 0946   Lab Results  Component Value Date   HGBA1C 7.7 (H)  05/15/2018   HGBA1C 8.3 (H) 09/28/2017   HGBA1C 7.2 (H) 03/09/2016   HGBA1C 7.9 08/12/2015   No results found for: INSULIN CBC    Component Value Date/Time   WBC 10.8 09/28/2017 1145   WBC 11.6 (H) 03/09/2016 1424   RBC 5.32 09/28/2017 1145   RBC 5.17 03/09/2016 1424   HGB 13.8 09/28/2017 1145   HCT 42.3 09/28/2017 1145   PLT 291 03/09/2016 1424   MCV 80 09/28/2017 1145   MCH 25.9 (L) 09/28/2017 1145   MCH 26.7 03/09/2016 1424   MCHC 32.6 09/28/2017 1145   MCHC 33.3 03/09/2016 1424   RDW 14.6 09/28/2017 1145   LYMPHSABS 1.7 09/28/2017 1145   EOSABS 0.1 09/28/2017 1145   BASOSABS 0.0 09/28/2017 1145   Iron/TIBC/Ferritin/ %Sat No results found for: IRON, TIBC, FERRITIN, IRONPCTSAT Lipid Panel     Component Value Date/Time   CHOL 185 05/15/2018 0946   TRIG 99 05/15/2018 0946   HDL 45 05/15/2018 0946   CHOLHDL 3.8 Ratio 01/09/2008 2228   VLDL 29 01/09/2008 2228   LDLCALC 120 (H) 05/15/2018 0946   Hepatic Function Panel     Component Value Date/Time   PROT 6.6 05/15/2018 0946   ALBUMIN 4.2 05/15/2018 0946   AST 16 05/15/2018 0946   ALT 17 05/15/2018 0946   ALKPHOS 94 05/15/2018 0946   BILITOT 0.2 05/15/2018 0946      Component Value Date/Time   TSH 0.471 09/28/2017 1145   TSH 0.499 02/15/2008 2052   TSH 0.106 (L) 01/09/2008 2228      I, Trixie Dredge, am acting as transcriptionist for Ilene Qua, MD   I have reviewed the above documentation for accuracy and completeness, and I agree with the above. - Ilene Qua, MD

## 2018-12-12 LAB — LIPID PANEL WITH LDL/HDL RATIO
Cholesterol, Total: 176 mg/dL (ref 100–199)
HDL: 52 mg/dL (ref >39)
LDL Chol Calc (NIH): 111 mg/dL — ABNORMAL HIGH (ref 0–99)
LDL/HDL Ratio: 2.1 ratio (ref 0.0–3.6)
Triglycerides: 67 mg/dL (ref 0–149)
VLDL Cholesterol Cal: 13 mg/dL (ref 5–40)

## 2018-12-12 LAB — HEMOGLOBIN A1C
Est. average glucose Bld gHb Est-mCnc: 171 mg/dL
Hgb A1c MFr Bld: 7.6 % — ABNORMAL HIGH (ref 4.8–5.6)

## 2018-12-14 ENCOUNTER — Other Ambulatory Visit: Payer: Self-pay | Admitting: Internal Medicine

## 2018-12-15 ENCOUNTER — Telehealth: Payer: Self-pay | Admitting: Internal Medicine

## 2018-12-15 MED ORDER — ALBUTEROL SULFATE HFA 108 (90 BASE) MCG/ACT IN AERS: 2.0000 | INHALATION_SPRAY | Freq: Four times a day (QID) | RESPIRATORY_TRACT | 0 refills | Status: DC | PRN

## 2018-12-15 NOTE — Telephone Encounter (Signed)
Call returned to patient, requesting refill of albuterol. Appt for 12/25/2018. Refill sent for 1 month until patient is able to make appt. Appt confirmed with patient. Nothing further needed at this time.

## 2018-12-21 ENCOUNTER — Telehealth (INDEPENDENT_AMBULATORY_CARE_PROVIDER_SITE_OTHER): Payer: Medicare Other | Admitting: Family Medicine

## 2018-12-21 ENCOUNTER — Encounter (INDEPENDENT_AMBULATORY_CARE_PROVIDER_SITE_OTHER): Payer: Self-pay | Admitting: Family Medicine

## 2018-12-21 ENCOUNTER — Other Ambulatory Visit: Payer: Self-pay

## 2018-12-21 DIAGNOSIS — E119 Type 2 diabetes mellitus without complications: Secondary | ICD-10-CM | POA: Diagnosis not present

## 2018-12-21 DIAGNOSIS — Z794 Long term (current) use of insulin: Secondary | ICD-10-CM | POA: Diagnosis not present

## 2018-12-21 DIAGNOSIS — Z6838 Body mass index (BMI) 38.0-38.9, adult: Secondary | ICD-10-CM

## 2018-12-21 DIAGNOSIS — T402X5A Adverse effect of other opioids, initial encounter: Secondary | ICD-10-CM

## 2018-12-21 DIAGNOSIS — K5903 Drug induced constipation: Secondary | ICD-10-CM

## 2018-12-25 ENCOUNTER — Encounter: Payer: Self-pay | Admitting: Internal Medicine

## 2018-12-25 ENCOUNTER — Ambulatory Visit: Payer: Medicare Other | Admitting: Internal Medicine

## 2018-12-25 ENCOUNTER — Other Ambulatory Visit: Payer: Self-pay

## 2018-12-25 DIAGNOSIS — J4520 Mild intermittent asthma, uncomplicated: Secondary | ICD-10-CM

## 2018-12-25 DIAGNOSIS — G4733 Obstructive sleep apnea (adult) (pediatric): Secondary | ICD-10-CM

## 2018-12-25 NOTE — Assessment & Plan Note (Signed)
Not wheezing and not needing bronchodilators currently

## 2018-12-25 NOTE — Assessment & Plan Note (Signed)
Way too heavy. This is aggravating several medical problems. Pressed to work more diligently on weight loss, diet. Consider [facility].

## 2018-12-25 NOTE — Progress Notes (Signed)
HPI  male never smoker followed for OSA, complicated by DM, hx DVT, HBP, morbid obesity ------------------- . 07/14/2017- 73 year old male never smoker followed for OSA, asthma, complicated by DM, hx DVT, HBP CPAP auto 8-20/ [facility] ----Pt states CPAP was switched out and is very pleased with the new machine. DME: [facility] Increased nasal congestion x 2 weeks "pollen", but not interfering with CPAP use.  Download compliance 100 %, AHI 5.3/ hr. Wife confirms not snoring.  Asthma good- only occasional need for rescue inhaler.  12/25/2018- 73 year old male never smoker followed for OSA, asthma, complicated by DM, hx DVT, HBP, morbid obesity CPAP auto 8-20/ [facility] Sent last year for mask desensitization and fitting. -----OSA on CPAP, DME: [facility], no complaints other than "whistling" noise from CPAP, pt spouse reports breakthrough apneas on machine Body weight today 272 lbs Download not available More tired last few weeks, not sure why. Prefers nasal pillows after trying others. Some positional leak. Pressures feel comfortable. Weight gain over past year. Chronic back pain-pain clinic.  Had flu vax. Discussed Covid and potential vaccine.   ROS-see HPI   + = positive Constitutional:   No-   weight loss, night sweats, fevers, chills, +fatigue, lassitude. HEENT:   No-  headaches, difficulty swallowing, tooth/dental problems, sore throat,       No-  sneezing, itching, ear ache, +nasal congestion, post nasal drip,  CV:  No-   chest pain, orthopnea, PND, swelling in lower extremities, anasarca, dizziness, palpitations Resp: No-   shortness of breath with exertion or at rest.              No-   productive cough,  No non-productive cough,  No- coughing up of blood.              No-   change in color of mucus.  + wheezing.   Skin: No-   rash or lesions. GI:  No-   heartburn, indigestion, abdominal pain, nausea, vomiting,  GU: . MS:  No-   joint pain or swelling.  + back pain. Neuro-     nothing  unusual Psych:  No- change in mood or affect. No depression or anxiety.  No memory loss.  OBJ- Physical Exam General- Alert, Oriented, Affect-appropriate, Distress-mild due to back pain .  + Morbidly obese Skin- rash-none, lesions- none, excoriation- none Lymphadenopathy- none Head- atraumatic.             Eyes- Gross vision intact, PERRLA, conjunctivae and secretions clear            Ears- +Hearing aid/ HOH            Nose- Clear, no-Septal dev, mucus, polyps, erosion, perforation             Throat- Mallampati III , mucosa clear , drainage- none, tonsils- atrophic. +Dentures Neck- flexible , trachea midline, no stridor , thyroid nl, carotid no bruit Chest - symmetrical excursion , unlabored           Heart/CV- RRR , no murmur , no gallop  , no rub, nl s1 s2                           - JVD- none , edema- none, stasis changes- none, varices- none           Lung- clear,  wheeze -none, cough- none , dullness-none, rub- none           Chest wall-  Abd-  Br/ Gen/ Rectal- Not done, not indicated Extrem- cyanosis- none, clubbing, none, atrophy- none, strength- nl Neuro-+ seems restless, shifting side to side

## 2018-12-25 NOTE — Patient Instructions (Signed)
We can continue CPAP auto 8-20, mask of choice, humidifier, supplies, AirView / card  Please call if we can help

## 2018-12-25 NOTE — Progress Notes (Signed)
Office: [phone]  /  Fax: [phone] TeleHealth Visit:  Max [name] has verbally consented to this TeleHealth visit today. The patient is located at home, the provider is located at the [facility] office. The participants in this visit include the listed provider and patient. Raphael was unable to use realtime audiovisual technology today and the telehealth visit was conducted via telephone.   HPI:   Chief Complaint: OBESITY Max [name] is here to discuss his progress with his obesity treatment plan. He is on the Category 3 plan and is following his eating plan approximately 75-80 % of the time. He states he is swimming and doing calisthenics for 45 minutes-2 hours 7 times per week. [name] has recently gotten back into swimming and physical activity. He was diagnosed with ear polyp and he is worried because he was accidentally given ophthalmic solution instead of otic. He states his was was 263 lbs.  We were unable to weigh the patient today for this TeleHealth visit. He feels as if he has lost 2 lbs since his last visit. He has lost 5 lbs since starting treatment with [name].  Diabetes II with Hyperglycemia Max [name] has a diagnosis of diabetes type II. Max [name] states his blood sugars have been controlled. His fasting BGs range between 115 and 120's. He is checking blood sugars up to TID. He denies hypoglycemia. Last A1c was 7.6. He has been working on intensive lifestyle modifications including diet, exercise, and weight loss to help control his blood glucose levels.  Constipation, Opioid Induced [name] notes constipation. He is doing Benefiber and 3/4 cap of miralax. He is occasionally still having bloating and minimal stool output. He denies hematochezia or melena. He denies drinking less H20 recently.  ASSESSMENT AND PLAN:  Type 2 diabetes mellitus without complication, with long-term current use of insulin (HCC)  Therapeutic opioid induced constipation  Class 2  severe obesity with serious comorbidity and body mass index (BMI) of 38.0 to 38.9 in adult, unspecified obesity type ([place])  PLAN:  Diabetes II with Hyperglycemia Max [name] has been given extensive diabetes education by myself today including ideal fasting and post-prandial blood glucose readings, individual ideal Hgb A1c goals and hypoglycemia prevention. We discussed the importance of good blood sugar control to decrease the likelihood of diabetic complications such as nephropathy, neuropathy, limb loss, blindness, coronary artery disease, and death. We discussed the importance of intensive lifestyle modification including diet, exercise and weight loss as the first line treatment for diabetes. Max [name] agrees to continue his current insulin dosage. He is to continue checking his blood sugars, if hypoglycemic he is to call or MyChart. Max [name] agrees to follow up with our clinic in 2 weeks.  Constipation, Opioid Induced Max [name] was informed decrease bowel movement frequency is normal while losing weight, but stools should not be hard or painful. He was advised to increase his H20 intake and work on increasing his fiber intake. High fiber foods were discussed today. Max [name] can add magnesium citrate every 3 days if needed. Max [name] agrees to follow up with our clinic in 2 weeks.  Obesity Max [name] is currently in the action stage of change. As such, his goal is to continue with weight loss efforts He has agreed to follow the Category 3 plan Max [name] has been instructed to work up to a goal of 150 minutes of combined cardio and strengthening exercise per week for weight loss and overall health benefits. We discussed the following Behavioral Modification Strategies today: increasing lean protein intake, increasing vegetables  and work on meal planning and easy cooking plans, keeping healthy foods in the home, and planning for success   Max [name] has agreed to follow up with our clinic in 2 weeks. He was  informed of the importance of frequent follow up visits to maximize his success with intensive lifestyle modifications for his multiple health conditions.  ALLERGIES: Allergies  Allergen Reactions  . Butane Anxiety and Other (See Comments)    Hallucinations REACTIONS ARE SIDE EFFECTS   . Gabapentin Other (See Comments)    Causes agression  . Hydromorphone Swelling    SWELLING REACTION UNSPECIFIED   . Tizanidine Swelling    SWELLING REACTION UNSPECIFIED   . Topamax [Topiramate] Other (See Comments)    Hallucinations   . Buprenorphine Other (See Comments)    UNSPECIFIED REACTION   . Pork-Derived Products     Pt. Refuses pork derived products  . Latex Rash    Localized rash    MEDICATIONS: Current Outpatient Medications on File Prior to Visit  Medication Sig Dispense Refill  . albuterol (VENTOLIN HFA) 108 (90 Base) MCG/ACT inhaler Inhale 2 puffs into the lungs every 6 (six) hours as needed for wheezing or shortness of breath. 6.7 g 0  . alfuzosin (UROXATRAL) 10 MG 24 hr tablet Take 10 mg by mouth daily with breakfast.    . amLODipine (NORVASC) 10 MG tablet Take 10 mg by mouth daily.     . Blood Glucose Monitoring Suppl (ONETOUCH VERIO) w/Device KIT 1 kit by Does not apply route daily at 12 noon. Continuous Glucose Monitoring Kit 1 kit 0  . chlorthalidone (HYGROTON) 25 MG tablet Take 1 tablet (25 mg total) by mouth daily. 90 tablet 0  . Cholecalciferol (D3-1000 PO) Take 1 capsule by mouth daily.    . DULoxetine (CYMBALTA) 60 MG capsule Take 60 mg by mouth 2 (two) times daily.    . ferrous sulfate 325 (65 FE) MG EC tablet Take 325 mg by mouth daily with breakfast.    . Flaxseed Oil OIL 750 mg daily.    Marland Kitchen glucose blood (ACCU-CHEK AVIVA) test strip Use as instructed 100 each 0  . hydrOXYzine (VISTARIL) 25 MG capsule   1  . Lancets (ACCU-CHEK SAFE-T PRO) lancets Use as instructed 100 each 0  . Levorphanol Tartrate 3 MG TABS Take 3 mg by mouth QID.    Marland Kitchen losartan (COZAAR) 100 MG tablet  Take 100 mg by mouth daily.    . Magnesium 400 MG TABS Take 1 tablet by mouth daily.    . naproxen sodium (ANAPROX) 220 MG tablet Take 220 mg by mouth daily as needed (for pain).     Marland Kitchen oxyCODONE (ROXICODONE) 15 MG immediate release tablet Take 10 mg by mouth 3 (three) times daily.     . Potassium 99 MG TABS Take 1 tablet by mouth daily.    . pravastatin (PRAVACHOL) 20 MG tablet Take 40 mg by mouth at bedtime.     . Probiotic Product (PROBIOTIC-10 PO) Take 1 capsule by mouth daily.    . tamsulosin (FLOMAX) 0.4 MG CAPS capsule Take 0.4 mg by mouth daily.     Marland Kitchen topiramate (TOPAMAX) 25 MG tablet Take 25 mg by mouth 2 (two) times daily.    . [name] 1.5 GY/6.5LD SOPN     . vitamin B-12 (CYANOCOBALAMIN) 1000 MCG tablet Take 1,000 mcg by mouth daily.    . insulin regular (NOVOLIN R RELION) 100 units/mL injection Inject 0.3 mLs (30 Units total) into the skin 3 (  three) times daily before meals. 27 mL 0   No current facility-administered medications on file prior to visit.     PAST MEDICAL HISTORY: Past Medical History:  Diagnosis Date  . ADD (attention deficit disorder)   . Anxiety   . Back pain   . Bipolar 1 disorder ([place])   . Chronic pain   . Constipation   . Decreased hearing   . Depression   . Diabetes mellitus without complication ([place])    Type II  . Dry skin   . DVT (deep venous thrombosis) ([place]) 2004   leg (not sure which leg)  . Dyslipidemia   . Dyspnea    with exertion  . Ear drainage   . Excessive hunger   . Excessive thirst   . Eye pain   . Fatigue   . Frequent urination   . Hard of hearing   . Hyperlipidemia   . Hypertension   . Itching   . Joint pain   . Lactose intolerance   . Leg cramps   . Mouth sores   . Nasal and sinus discharge   . Nausea   . Numbness and tingling    bilateral  . OSA (obstructive sleep apnea)    wears CPAP  . Shortness of breath   . Shortness of breath on exertion   . Sinus congestion   . Stiff neck   . Stress   . Swallowing  difficulty   . Swelling of extremity   . Tinnitus   . Trouble in sleeping   . Urinary frequency   . Vision changes   . Weakness   . Wears glasses     PAST SURGICAL HISTORY: Past Surgical History:  Procedure Laterality Date  . arm surgery Right   . BACK SURGERY  2004  . COLONOSCOPY    . ESOPHAGOGASTRODUODENOSCOPY    . EYE SURGERY Right    cataract  . SUBMANDIBULAR GLAND EXCISION Right 03/10/2016   Procedure: INTRAORAL  SUBMANDIBULAR  STONE REMOVAL;  Surgeon: Izora Gala, MD;  Location: [facility];  Service: ENT;  Laterality: Right;    SOCIAL HISTORY: Social History   Tobacco Use  . Smoking status: Never Smoker  . Smokeless tobacco: Never Used  Substance Use Topics  . Alcohol use: No  . Drug use: No    FAMILY HISTORY: Family History  Problem Relation Age of Onset  . Diabetes Mother   . Hypertension Mother   . Hyperlipidemia Mother   . Kidney disease Mother   . Cancer Mother   . Diabetes Brother     ROS: Review of Systems  Constitutional: Positive for weight loss.  Gastrointestinal: Positive for constipation. Negative for melena.       Negative hematochezia  Endo/Heme/Allergies:       Negative hypoglycemia    PHYSICAL EXAM: Pt in no acute distress  RECENT LABS AND TESTS: BMET    Component Value Date/Time   NA 139 05/15/2018 0946   K 4.1 05/15/2018 0946   CL 98 05/15/2018 0946   CO2 26 05/15/2018 0946   GLUCOSE 173 (H) 05/15/2018 0946   GLUCOSE 208 (H) 03/09/2016 1424   BUN 14 05/15/2018 0946   CREATININE 0.95 05/15/2018 0946   CALCIUM 9.2 05/15/2018 0946   GFRNONAA 81 05/15/2018 0946   GFRAA 93 05/15/2018 0946   Lab Results  Component Value Date   HGBA1C 7.6 (H) 12/11/2018   HGBA1C 7.7 (H) 05/15/2018   HGBA1C 8.3 (H) 09/28/2017   HGBA1C 7.2 (H)  03/09/2016   HGBA1C 7.9 08/12/2015   No results found for: INSULIN CBC    Component Value Date/Time   WBC 10.8 09/28/2017 1145   WBC 11.6 (H) 03/09/2016 1424   RBC 5.32 09/28/2017 1145   RBC 5.17  03/09/2016 1424   HGB 13.8 09/28/2017 1145   HCT 42.3 09/28/2017 1145   PLT 291 03/09/2016 1424   MCV 80 09/28/2017 1145   MCH 25.9 (L) 09/28/2017 1145   MCH 26.7 03/09/2016 1424   MCHC 32.6 09/28/2017 1145   MCHC 33.3 03/09/2016 1424   RDW 14.6 09/28/2017 1145   LYMPHSABS 1.7 09/28/2017 1145   EOSABS 0.1 09/28/2017 1145   BASOSABS 0.0 09/28/2017 1145   Iron/TIBC/Ferritin/ %Sat No results found for: IRON, TIBC, FERRITIN, IRONPCTSAT Lipid Panel     Component Value Date/Time   CHOL 176 12/11/2018 0840   TRIG 67 12/11/2018 0840   HDL 52 12/11/2018 0840   CHOLHDL 3.8 Ratio 01/09/2008 2228   VLDL 29 01/09/2008 2228   LDLCALC 120 (H) 05/15/2018 0946   Hepatic Function Panel     Component Value Date/Time   PROT 6.6 05/15/2018 0946   ALBUMIN 4.2 05/15/2018 0946   AST 16 05/15/2018 0946   ALT 17 05/15/2018 0946   ALKPHOS 94 05/15/2018 0946   BILITOT 0.2 05/15/2018 0946      Component Value Date/Time   TSH 0.471 09/28/2017 1145   TSH 0.499 02/15/2008 2052   TSH 0.106 (L) 01/09/2008 2228      I, Trixie Dredge, am acting as transcriptionist for Ilene Qua, MD  I have reviewed the above documentation for accuracy and completeness, and I agree with the above. - Ilene Qua, MD

## 2018-12-25 NOTE — Assessment & Plan Note (Signed)
He is using CPAP per wife, and better off with it.  Plan- continue auto 8-20. Request download from [facility]. Consider possible need for formal CPAP titration.

## 2019-01-02 ENCOUNTER — Encounter (INDEPENDENT_AMBULATORY_CARE_PROVIDER_SITE_OTHER): Payer: Self-pay | Admitting: Family Medicine

## 2019-01-02 ENCOUNTER — Other Ambulatory Visit: Payer: Self-pay

## 2019-01-02 ENCOUNTER — Ambulatory Visit (INDEPENDENT_AMBULATORY_CARE_PROVIDER_SITE_OTHER): Payer: Medicare Other | Admitting: Family Medicine

## 2019-01-02 DIAGNOSIS — Z794 Long term (current) use of insulin: Secondary | ICD-10-CM | POA: Diagnosis not present

## 2019-01-02 DIAGNOSIS — I10 Essential (primary) hypertension: Secondary | ICD-10-CM | POA: Diagnosis not present

## 2019-01-02 DIAGNOSIS — E119 Type 2 diabetes mellitus without complications: Secondary | ICD-10-CM | POA: Diagnosis not present

## 2019-01-02 DIAGNOSIS — Z6838 Body mass index (BMI) 38.0-38.9, adult: Secondary | ICD-10-CM

## 2019-01-03 NOTE — Progress Notes (Signed)
Office: [phone]  /  Fax: [phone] TeleHealth Visit:  Max [name] has verbally consented to this TeleHealth visit today. The patient is located at home, the provider is located at the [employer] [facility] office. The participants in this visit include the listed provider and patient. Brandonn was unable to use realtime audiovisual technology today and the telehealth visit was conducted via telephone (16 minutes).   HPI:   Chief Complaint: OBESITY Max [name] is here to discuss his progress with his obesity treatment plan. He is on the Category 3 plan and is following his eating plan approximately 60 % of the time. He states he is swimming for 3 hours 2 times per week. Max [name] has been swimming up to 2 hours 3 times per week. He does mention he has been eating quite a bit of apples and peanut butter for snack. He notes more hunger with increase in activity.  We were unable to weigh the patient today for this TeleHealth visit. He feels as if he has maintained his weight since his last visit. He has lost 5 lbs since starting treatment with [name].  Diabetes II with Hyperglycemia [name] has a diagnosis of diabetes type II. [name] states his BGs are averaging around 163. He denies hypoglycemia or changes to insulin regimen. Last A1c was 7.6. He has been working on intensive lifestyle modifications including diet, exercise, and weight loss to help control his blood glucose levels.  Hypertension Max [name] is a 73 y.o. male with hypertension. Max [name]'s blood pressure was controlled at 143/74. He denies chest pain, chest pressure, or headaches. He is working on weight loss to help control his blood pressure with the goal of decreasing his risk of heart attack and stroke.   ASSESSMENT AND PLAN:  No diagnosis found.  PLAN:  Diabetes II with Hyperglycemia Max [name] has been given extensive diabetes education by myself today including ideal fasting and post-prandial blood glucose  readings, individual ideal Hgb A1c goals and hypoglycemia prevention. We discussed the importance of good blood sugar control to decrease the likelihood of diabetic complications such as nephropathy, neuropathy, limb loss, blindness, coronary artery disease, and death. We discussed the importance of intensive lifestyle modification including diet, exercise and weight loss as the first line treatment for diabetes. Max [name] agrees to continue his current insulin regimen, and he agrees to follow up with our clinic in 2 weeks.  Hypertension We discussed sodium restriction, working on healthy weight loss, and a regular exercise program as the means to achieve improved blood pressure control. Arther agreed with this plan and agreed to follow up as directed. We will continue to monitor his blood pressure as well as his progress with the above lifestyle modifications. Max [name] agrees to continue his current medications, no refill needed. He will watch for signs of hypotension as he continues his lifestyle modifications. Max [name] agrees to follow up with our clinic in 2 weeks.  Obesity Max [name] is currently in the action stage of change. As such, his goal is to continue with weight loss efforts He has agreed to follow the Category 3 plan Max [name] has been instructed to work up to a goal of 150 minutes of combined cardio and strengthening exercise per week for weight loss and overall health benefits. We discussed the following Behavioral Modification Strategies today: increasing lean protein intake, increasing vegetables and work on meal planning and easy cooking plans, keeping healthy foods in the home, and planning for success   Max [name] has agreed to follow up with  our clinic in 2 weeks. He was informed of the importance of frequent follow up visits to maximize his success with intensive lifestyle modifications for his multiple health conditions.  ALLERGIES: Allergies  Allergen Reactions  . Butane Anxiety and  Other (See Comments)    Hallucinations REACTIONS ARE SIDE EFFECTS   . Gabapentin Other (See Comments)    Causes agression  . Hydromorphone Swelling    SWELLING REACTION UNSPECIFIED   . Tizanidine Swelling    SWELLING REACTION UNSPECIFIED   . Topamax [Topiramate] Other (See Comments)    Hallucinations   . Buprenorphine Other (See Comments)    UNSPECIFIED REACTION   . Pork-Derived Products     Pt. Refuses pork derived products  . Latex Rash    Localized rash    MEDICATIONS: Current Outpatient Medications on File Prior to Visit  Medication Sig Dispense Refill  . albuterol (VENTOLIN HFA) 108 (90 Base) MCG/ACT inhaler Inhale 2 puffs into the lungs every 6 (six) hours as needed for wheezing or shortness of breath. 6.7 g 0  . alfuzosin (UROXATRAL) 10 MG 24 hr tablet Take 10 mg by mouth daily with breakfast.    . amLODipine (NORVASC) 10 MG tablet Take 10 mg by mouth daily.     . Blood Glucose Monitoring Suppl (ONETOUCH VERIO) w/Device KIT 1 kit by Does not apply route daily at 12 noon. Continuous Glucose Monitoring Kit 1 kit 0  . chlorthalidone (HYGROTON) 25 MG tablet Take 1 tablet (25 mg total) by mouth daily. 90 tablet 0  . Cholecalciferol (D3-1000 PO) Take 1 capsule by mouth daily.    . DULoxetine (CYMBALTA) 60 MG capsule Take 60 mg by mouth 2 (two) times daily.    . ferrous sulfate 325 (65 FE) MG EC tablet Take 325 mg by mouth daily with breakfast.    . Flaxseed Oil OIL 750 mg daily.    Marland Kitchen glucose blood (ACCU-CHEK AVIVA) test strip Use as instructed 100 each 0  . hydrOXYzine (VISTARIL) 25 MG capsule   1  . Lancets (ACCU-CHEK SAFE-T PRO) lancets Use as instructed 100 each 0  . Levorphanol Tartrate 3 MG TABS Take 3 mg by mouth 3 (three) times daily.     Marland Kitchen losartan (COZAAR) 100 MG tablet Take 100 mg by mouth daily.    . Magnesium 400 MG TABS Take 1 tablet by mouth daily.    . naproxen sodium (ANAPROX) 220 MG tablet Take 220 mg by mouth daily as needed (for pain).     Marland Kitchen oxyCODONE  (ROXICODONE) 15 MG immediate release tablet Take 10 mg by mouth 3 (three) times daily.     . Potassium 99 MG TABS Take 1 tablet by mouth daily.    . pravastatin (PRAVACHOL) 20 MG tablet Take 40 mg by mouth at bedtime.     . Probiotic Product (PROBIOTIC-10 PO) Take 1 capsule by mouth daily.    . tamsulosin (FLOMAX) 0.4 MG CAPS capsule Take 0.4 mg by mouth daily.     Marland Kitchen topiramate (TOPAMAX) 25 MG tablet Take 25 mg by mouth 2 (two) times daily.    . [name] 1.5 SF/6.8LE SOPN     . vitamin B-12 (CYANOCOBALAMIN) 1000 MCG tablet Take 1,000 mcg by mouth daily.    . insulin regular (NOVOLIN R RELION) 100 units/mL injection Inject 0.3 mLs (30 Units total) into the skin 3 (three) times daily before meals. 27 mL 0   No current facility-administered medications on file prior to visit.     PAST  MEDICAL HISTORY: Past Medical History:  Diagnosis Date  . ADD (attention deficit disorder)   . Anxiety   . Back pain   . Bipolar 1 disorder ([place])   . Chronic pain   . Constipation   . Decreased hearing   . Depression   . Diabetes mellitus without complication ([place])    Type II  . Dry skin   . DVT (deep venous thrombosis) ([place]) 2004   leg (not sure which leg)  . Dyslipidemia   . Dyspnea    with exertion  . Ear drainage   . Excessive hunger   . Excessive thirst   . Eye pain   . Fatigue   . Frequent urination   . Hard of hearing   . Hyperlipidemia   . Hypertension   . Itching   . Joint pain   . Lactose intolerance   . Leg cramps   . Mouth sores   . Nasal and sinus discharge   . Nausea   . Numbness and tingling    bilateral  . OSA (obstructive sleep apnea)    wears CPAP  . Shortness of breath   . Shortness of breath on exertion   . Sinus congestion   . Stiff neck   . Stress   . Swallowing difficulty   . Swelling of extremity   . Tinnitus   . Trouble in sleeping   . Urinary frequency   . Vision changes   . Weakness   . Wears glasses     PAST SURGICAL HISTORY: Past Surgical  History:  Procedure Laterality Date  . arm surgery Right   . BACK SURGERY  2004  . COLONOSCOPY    . ESOPHAGOGASTRODUODENOSCOPY    . EYE SURGERY Right    cataract  . SUBMANDIBULAR GLAND EXCISION Right 03/10/2016   Procedure: INTRAORAL  SUBMANDIBULAR  STONE REMOVAL;  Surgeon: Izora Gala, MD;  Location: [facility];  Service: ENT;  Laterality: Right;    SOCIAL HISTORY: Social History   Tobacco Use  . Smoking status: Never Smoker  . Smokeless tobacco: Never Used  Substance Use Topics  . Alcohol use: No  . Drug use: No    FAMILY HISTORY: Family History  Problem Relation Age of Onset  . Diabetes Mother   . Hypertension Mother   . Hyperlipidemia Mother   . Kidney disease Mother   . Cancer Mother   . Diabetes Brother     ROS: Review of Systems  Constitutional: Negative for weight loss.  Cardiovascular: Negative for chest pain.       Negative chest pressure  Neurological: Negative for headaches.  Endo/Heme/Allergies:       Negative hypoglycemia    PHYSICAL EXAM: Pt in no acute distress  RECENT LABS AND TESTS: BMET    Component Value Date/Time   NA 139 05/15/2018 0946   K 4.1 05/15/2018 0946   CL 98 05/15/2018 0946   CO2 26 05/15/2018 0946   GLUCOSE 173 (H) 05/15/2018 0946   GLUCOSE 208 (H) 03/09/2016 1424   BUN 14 05/15/2018 0946   CREATININE 0.95 05/15/2018 0946   CALCIUM 9.2 05/15/2018 0946   GFRNONAA 81 05/15/2018 0946   GFRAA 93 05/15/2018 0946   Lab Results  Component Value Date   HGBA1C 7.6 (H) 12/11/2018   HGBA1C 7.7 (H) 05/15/2018   HGBA1C 8.3 (H) 09/28/2017   HGBA1C 7.2 (H) 03/09/2016   HGBA1C 7.9 08/12/2015   No results found for: INSULIN CBC    Component Value Date/Time  WBC 10.8 09/28/2017 1145   WBC 11.6 (H) 03/09/2016 1424   RBC 5.32 09/28/2017 1145   RBC 5.17 03/09/2016 1424   HGB 13.8 09/28/2017 1145   HCT 42.3 09/28/2017 1145   PLT 291 03/09/2016 1424   MCV 80 09/28/2017 1145   MCH 25.9 (L) 09/28/2017 1145   MCH 26.7 03/09/2016  1424   MCHC 32.6 09/28/2017 1145   MCHC 33.3 03/09/2016 1424   RDW 14.6 09/28/2017 1145   LYMPHSABS 1.7 09/28/2017 1145   EOSABS 0.1 09/28/2017 1145   BASOSABS 0.0 09/28/2017 1145   Iron/TIBC/Ferritin/ %Sat No results found for: IRON, TIBC, FERRITIN, IRONPCTSAT Lipid Panel     Component Value Date/Time   CHOL 176 12/11/2018 0840   TRIG 67 12/11/2018 0840   HDL 52 12/11/2018 0840   CHOLHDL 3.8 Ratio 01/09/2008 2228   VLDL 29 01/09/2008 2228   LDLCALC 111 (H) 12/11/2018 0840   Hepatic Function Panel     Component Value Date/Time   PROT 6.6 05/15/2018 0946   ALBUMIN 4.2 05/15/2018 0946   AST 16 05/15/2018 0946   ALT 17 05/15/2018 0946   ALKPHOS 94 05/15/2018 0946   BILITOT 0.2 05/15/2018 0946      Component Value Date/Time   TSH 0.471 09/28/2017 1145   TSH 0.499 02/15/2008 2052   TSH 0.106 (L) 01/09/2008 2228      I, Trixie Dredge, am acting as transcriptionist for Ilene Qua, MD  I have reviewed the above documentation for accuracy and completeness, and I agree with the above. - Ilene Qua, MD

## 2019-01-16 ENCOUNTER — Encounter (INDEPENDENT_AMBULATORY_CARE_PROVIDER_SITE_OTHER): Payer: Self-pay | Admitting: Bariatrics

## 2019-01-16 ENCOUNTER — Other Ambulatory Visit: Payer: Self-pay

## 2019-01-16 ENCOUNTER — Telehealth (INDEPENDENT_AMBULATORY_CARE_PROVIDER_SITE_OTHER): Payer: Medicare Other | Admitting: Bariatrics

## 2019-01-16 DIAGNOSIS — I10 Essential (primary) hypertension: Secondary | ICD-10-CM

## 2019-01-16 DIAGNOSIS — Z6838 Body mass index (BMI) 38.0-38.9, adult: Secondary | ICD-10-CM

## 2019-01-16 DIAGNOSIS — Z794 Long term (current) use of insulin: Secondary | ICD-10-CM

## 2019-01-16 DIAGNOSIS — E119 Type 2 diabetes mellitus without complications: Secondary | ICD-10-CM

## 2019-01-17 NOTE — Progress Notes (Signed)
Office: [phone]  /  Fax: [phone] TeleHealth Visit:  [name] has verbally consented to this TeleHealth visit today. The patient is located at home, the provider is located at the [employer] [facility] office. The participants in this visit include the listed provider, patient, and [name]. The visit was conducted today via telephone call ( 15 minutes ).   HPI:   Chief Complaint: OBESITY Max [name] is here to discuss his progress with his obesity treatment plan. He is on the Category 3 plan and is following his eating plan approximately 80% of the time. He states he is swimming with calisthenics 20 minutes 3 times per week. Bereket states that he has an ear infection. He normally sees Dr. Adair Patter. He states he is unsure if he has lost or gained weight (weight 267). We were unable to weigh the patient today for this TeleHealth visit. He is unsure if he has lost or gained weight since his last visit. He has lost 5 lbs since starting treatment with [name].  Diabetes II Uthman has a diagnosis of diabetes type II. Khyree states fasting blood sugars range between 134 and 138; 2-hour postprandial/nightly blood sugars are in the 160's - average 148 and reports 1 low blood sugar. Last A1c was 7.6 on 12/11/2018. He has been working on intensive lifestyle modifications including diet, exercise, and weight loss to help control his blood glucose levels.  Hypertension CAREEM YASUI is a 73 y.o. male with hypertension.  [name] denies chest pain or shortness of breath on exertion. No lightheadedness. He is working weight loss to help control his blood pressure with the goal of decreasing his risk of heart attack and stroke. Elchanan's blood pressure is 139/81, well controlled.   ASSESSMENT AND PLAN:  Type 2 diabetes mellitus without complication, with long-term current use of insulin (HCC)  Essential hypertension  Class 2 severe obesity with serious comorbidity and body  mass index (BMI) of 38.0 to 38.9 in adult, unspecified obesity type ([place])  PLAN:  Diabetes II Endre has been given extensive diabetes education by myself today including ideal fasting and post-prandial blood glucose readings, individual ideal Hgb A1c goals  and hypoglycemia prevention. We discussed the importance of good blood sugar control to decrease the likelihood of diabetic complications such as nephropathy, neuropathy, limb loss, blindness, coronary artery disease, and death. We discussed the importance of intensive lifestyle modification including diet, exercise and weight loss as the first line treatment for diabetes. Gracin will continue his medications and will make sure his protein is adequate. He will follow-up as directed.   Hypertension We discussed sodium restriction, working on healthy weight loss, and a regular exercise program as the means to achieve improved blood pressure control. Kermitt agreed with this plan and agreed to follow up as directed. We will continue to monitor his blood pressure as well as his progress with the above lifestyle modifications. He will continue his medications as prescribed and will watch for signs of hypotension as he continues his lifestyle modifications.  Obesity Dashaun is currently in the action stage of change. As such, his goal is to continue with weight loss efforts. He has agreed to follow the Category 3 plan. Jaekwon will work on meal planning and intentional eating. Jais has been instructed to continue exercise (swimming, calisthenics) for weight loss and overall health benefits. We discussed the following Behavioral Modification Strategies today: increasing lean protein intake, decreasing simple carbohydrates, increasing vegetables, increase H20 intake, decrease eating out, no  skipping meals, work on meal planning and easy cooking plans, keeping healthy foods in the home, celebration eating strategies, and planning for success.   Marquavious has agreed to follow-up with our clinic in 2 weeks. He was informed of the importance of frequent follow-up visits to maximize his success with intensive lifestyle modifications for his multiple health conditions.  ALLERGIES: Allergies  Allergen Reactions  . Butane Anxiety and Other (See Comments)    Hallucinations REACTIONS ARE SIDE EFFECTS   . Gabapentin Other (See Comments)    Causes agression  . Hydromorphone Swelling    SWELLING REACTION UNSPECIFIED   . Tizanidine Swelling    SWELLING REACTION UNSPECIFIED   . Topamax [Topiramate] Other (See Comments)    Hallucinations   . Buprenorphine Other (See Comments)    UNSPECIFIED REACTION   . Pork-Derived Products     Pt. Refuses pork derived products  . Latex Rash    Localized rash    MEDICATIONS: Current Outpatient Medications on File Prior to Visit  Medication Sig Dispense Refill  . alfuzosin (UROXATRAL) 10 MG 24 hr tablet Take 10 mg by mouth daily with breakfast.    . amLODipine (NORVASC) 10 MG tablet Take 10 mg by mouth daily.     . Blood Glucose Monitoring Suppl (ONETOUCH VERIO) w/Device KIT 1 kit by Does not apply route daily at 12 noon. Continuous Glucose Monitoring Kit 1 kit 0  . chlorthalidone (HYGROTON) 25 MG tablet Take 1 tablet (25 mg total) by mouth daily. 90 tablet 0  . Cholecalciferol (D3-1000 PO) Take 1 capsule by mouth daily.    . DULoxetine (CYMBALTA) 60 MG capsule Take 60 mg by mouth 2 (two) times daily.    . ferrous sulfate 325 (65 FE) MG EC tablet Take 325 mg by mouth daily with breakfast.    . Flaxseed Oil OIL 750 mg daily.    Marland Kitchen glucose blood (ACCU-CHEK AVIVA) test strip Use as instructed 100 each 0  . hydrOXYzine (VISTARIL) 25 MG capsule   1  . Lancets (ACCU-CHEK SAFE-T PRO) lancets Use as instructed 100 each 0  . Levorphanol Tartrate 3 MG TABS Take 3 mg by mouth 3 (three) times daily.     Marland Kitchen losartan (COZAAR) 100 MG tablet Take 100 mg by mouth daily.    . Magnesium 400 MG TABS Take 1 tablet by  mouth daily.    . naproxen sodium (ANAPROX) 220 MG tablet Take 220 mg by mouth daily as needed (for pain).     Marland Kitchen oxyCODONE (ROXICODONE) 15 MG immediate release tablet Take 10 mg by mouth 3 (three) times daily.     . Potassium 99 MG TABS Take 1 tablet by mouth daily.    . pravastatin (PRAVACHOL) 20 MG tablet Take 40 mg by mouth at bedtime.     . Probiotic Product (PROBIOTIC-10 PO) Take 1 capsule by mouth daily.    . tamsulosin (FLOMAX) 0.4 MG CAPS capsule Take 0.4 mg by mouth daily.     . [name] 1.5 PF/7.9KW SOPN     . vitamin B-12 (CYANOCOBALAMIN) 1000 MCG tablet Take 1,000 mcg by mouth daily.    Marland Kitchen albuterol (VENTOLIN HFA) 108 (90 Base) MCG/ACT inhaler Inhale 2 puffs into the lungs every 6 (six) hours as needed for wheezing or shortness of breath. 6.7 g 0  . insulin regular (NOVOLIN R RELION) 100 units/mL injection Inject 0.3 mLs (30 Units total) into the skin 3 (three) times daily before meals. 27 mL 0   No current facility-administered  medications on file prior to visit.     PAST MEDICAL HISTORY: Past Medical History:  Diagnosis Date  . ADD (attention deficit disorder)   . Anxiety   . Back pain   . Bipolar 1 disorder ([place])   . Chronic pain   . Constipation   . Decreased hearing   . Depression   . Diabetes mellitus without complication ([place])    Type II  . Dry skin   . DVT (deep venous thrombosis) ([place]) 2004   leg (not sure which leg)  . Dyslipidemia   . Dyspnea    with exertion  . Ear drainage   . Excessive hunger   . Excessive thirst   . Eye pain   . Fatigue   . Frequent urination   . Hard of hearing   . Hyperlipidemia   . Hypertension   . Itching   . Joint pain   . Lactose intolerance   . Leg cramps   . Mouth sores   . Nasal and sinus discharge   . Nausea   . Numbness and tingling    bilateral  . OSA (obstructive sleep apnea)    wears CPAP  . Shortness of breath   . Shortness of breath on exertion   . Sinus congestion   . Stiff neck   . Stress   .  Swallowing difficulty   . Swelling of extremity   . Tinnitus   . Trouble in sleeping   . Urinary frequency   . Vision changes   . Weakness   . Wears glasses     PAST SURGICAL HISTORY: Past Surgical History:  Procedure Laterality Date  . arm surgery Right   . BACK SURGERY  2004  . COLONOSCOPY    . ESOPHAGOGASTRODUODENOSCOPY    . EYE SURGERY Right    cataract  . SUBMANDIBULAR GLAND EXCISION Right 03/10/2016   Procedure: INTRAORAL  SUBMANDIBULAR  STONE REMOVAL;  Surgeon: Izora Gala, MD;  Location: [facility];  Service: ENT;  Laterality: Right;    SOCIAL HISTORY: Social History   Tobacco Use  . Smoking status: Never Smoker  . Smokeless tobacco: Never Used  Substance Use Topics  . Alcohol use: No  . Drug use: No    FAMILY HISTORY: Family History  Problem Relation Age of Onset  . Diabetes Mother   . Hypertension Mother   . Hyperlipidemia Mother   . Kidney disease Mother   . Cancer Mother   . Diabetes Brother    ROS: Review of Systems  Respiratory: Negative for shortness of breath.   Cardiovascular: Negative for chest pain.  Neurological:       Negative for lightheadedness.   PHYSICAL EXAM: Pt in no acute distress  RECENT LABS AND TESTS: BMET    Component Value Date/Time   NA 139 05/15/2018 0946   K 4.1 05/15/2018 0946   CL 98 05/15/2018 0946   CO2 26 05/15/2018 0946   GLUCOSE 173 (H) 05/15/2018 0946   GLUCOSE 208 (H) 03/09/2016 1424   BUN 14 05/15/2018 0946   CREATININE 0.95 05/15/2018 0946   CALCIUM 9.2 05/15/2018 0946   GFRNONAA 81 05/15/2018 0946   GFRAA 93 05/15/2018 0946   Lab Results  Component Value Date   HGBA1C 7.6 (H) 12/11/2018   HGBA1C 7.7 (H) 05/15/2018   HGBA1C 8.3 (H) 09/28/2017   HGBA1C 7.2 (H) 03/09/2016   HGBA1C 7.9 08/12/2015   No results found for: INSULIN CBC    Component Value Date/Time  WBC 10.8 09/28/2017 1145   WBC 11.6 (H) 03/09/2016 1424   RBC 5.32 09/28/2017 1145   RBC 5.17 03/09/2016 1424   HGB 13.8 09/28/2017  1145   HCT 42.3 09/28/2017 1145   PLT 291 03/09/2016 1424   MCV 80 09/28/2017 1145   MCH 25.9 (L) 09/28/2017 1145   MCH 26.7 03/09/2016 1424   MCHC 32.6 09/28/2017 1145   MCHC 33.3 03/09/2016 1424   RDW 14.6 09/28/2017 1145   LYMPHSABS 1.7 09/28/2017 1145   EOSABS 0.1 09/28/2017 1145   BASOSABS 0.0 09/28/2017 1145   Iron/TIBC/Ferritin/ %Sat No results found for: IRON, TIBC, FERRITIN, IRONPCTSAT Lipid Panel     Component Value Date/Time   CHOL 176 12/11/2018 0840   TRIG 67 12/11/2018 0840   HDL 52 12/11/2018 0840   CHOLHDL 3.8 Ratio 01/09/2008 2228   VLDL 29 01/09/2008 2228   LDLCALC 111 (H) 12/11/2018 0840   Hepatic Function Panel     Component Value Date/Time   PROT 6.6 05/15/2018 0946   ALBUMIN 4.2 05/15/2018 0946   AST 16 05/15/2018 0946   ALT 17 05/15/2018 0946   ALKPHOS 94 05/15/2018 0946   BILITOT 0.2 05/15/2018 0946      Component Value Date/Time   TSH 0.471 09/28/2017 1145   TSH 0.499 02/15/2008 2052   TSH 0.106 (L) 01/09/2008 2228   Results for BRAESON, RUPE A (MRN [id number]) as of 01/17/2019 16:09  Ref. Range 09/28/2017 11:45  Vitamin D, 25-Hydroxy Latest Ref Range: 30.0 - 100.0 ng/mL 50.1   I, Michaelene Song, am acting as Location manager for [employer], DO

## 2019-02-06 ENCOUNTER — Ambulatory Visit (INDEPENDENT_AMBULATORY_CARE_PROVIDER_SITE_OTHER): Payer: Medicare Other | Admitting: Bariatrics

## 2019-02-19 ENCOUNTER — Other Ambulatory Visit (INDEPENDENT_AMBULATORY_CARE_PROVIDER_SITE_OTHER): Payer: Self-pay | Admitting: Family Medicine

## 2019-02-19 ENCOUNTER — Other Ambulatory Visit: Payer: Self-pay | Admitting: Family Medicine

## 2019-02-19 DIAGNOSIS — Z136 Encounter for screening for cardiovascular disorders: Secondary | ICD-10-CM

## 2019-02-19 DIAGNOSIS — I10 Essential (primary) hypertension: Secondary | ICD-10-CM

## 2019-02-19 DIAGNOSIS — Z87891 Personal history of nicotine dependence: Secondary | ICD-10-CM

## 2019-02-27 ENCOUNTER — Telehealth (INDEPENDENT_AMBULATORY_CARE_PROVIDER_SITE_OTHER): Payer: Medicare Other | Admitting: Bariatrics

## 2019-02-27 ENCOUNTER — Encounter (INDEPENDENT_AMBULATORY_CARE_PROVIDER_SITE_OTHER): Payer: Self-pay | Admitting: Bariatrics

## 2019-02-27 ENCOUNTER — Other Ambulatory Visit: Payer: Self-pay

## 2019-02-27 DIAGNOSIS — Z6838 Body mass index (BMI) 38.0-38.9, adult: Secondary | ICD-10-CM

## 2019-02-27 DIAGNOSIS — E119 Type 2 diabetes mellitus without complications: Secondary | ICD-10-CM

## 2019-02-27 DIAGNOSIS — E7849 Other hyperlipidemia: Secondary | ICD-10-CM

## 2019-02-27 DIAGNOSIS — I10 Essential (primary) hypertension: Secondary | ICD-10-CM

## 2019-02-27 DIAGNOSIS — Z794 Long term (current) use of insulin: Secondary | ICD-10-CM

## 2019-02-27 NOTE — Progress Notes (Signed)
Office: [phone]  /  Fax: [phone] TeleHealth Visit:  Max [name] has verbally consented to this TeleHealth visit today. The patient is located at home, the provider is located at the [employer] [facility] office. The participants in this visit include the listed provider and patient. The visit was conducted today via telephone call.  HPI:   Chief Complaint: OBESITY Max [name] is here to discuss his progress with his obesity treatment plan. He is on the Category 3 plan and is following his eating plan approximately 30% of the time. He states he is swimming and doing aerobics 90 minutes 3 times per week. Max [name] states that he has lost 4 lbs (weight 263). We were unable to weigh the patient today for this TeleHealth visit. He feels as if he has lost weight since his last visit. He has lost 5 lbs since starting treatment with [name].  Diabetes II Max [name] has a diagnosis of diabetes type II and is taking Novolin and [name]. Max [name] does not report checking his blood sugars. Last A1c was 7.6 on 12/11/2018. He has been working on intensive lifestyle modifications including diet, exercise, and weight loss to help control his blood glucose levels.  Hyperlipidemia Max [name] has hyperlipidemia, which is controlled, and has been trying to improve his cholesterol levels with intensive lifestyle modification including a low saturated fat diet, exercise and weight loss. He is taking Pravachol and denies any chest pain, claudication or myalgias.  Hypertension Max [name] is a 73 y.o. male with hypertension. Max [name] denies chest pain or shortness of breath on exertion. No lightheadedness. He is working weight loss to help control his blood pressure with the goal of decreasing his risk of heart attack and stroke. Max [name]'s blood pressure ranges 90's over 50's.   ASSESSMENT AND PLAN:  Type 2 diabetes mellitus without complication, with long-term current use of insulin (HCC)  Other hyperlipidemia  Essential hypertension  Class 2 severe obesity with serious comorbidity and body mass index (BMI) of 38.0 to 38.9 in adult, unspecified obesity type ([place])  PLAN:  Diabetes II Max [name] has been given diabetes education by myself including ideal fasting and post-prandial blood glucose readings, individual ideal HgA1c goals  and hypoglycemia prevention. We discussed the importance of good blood sugar control to decrease the likelihood of diabetic complications such as nephropathy, neuropathy, limb loss, blindness, coronary artery disease, and death. We discussed the importance of intensive lifestyle modification including diet, exercise and weight loss as the first line treatment for diabetes. Max [name] agrees to continue his diabetes medications and will follow-up at the agreed upon time.  Hyperlipidemia Max [name] was informed of the American Heart Association Guidelines emphasizing intensive lifestyle modifications as the first line treatment for hyperlipidemia. We discussed many lifestyle modifications today in depth, and Max [name] will continue to work on decreasing saturated fats such as fatty red meat, butter and many fried foods. Max [name] will continue his medications and decrease carbohydrates and saturated fats. He will increase  MUFA's and PUFA's. He will also increase vegetables and lean protein in his diet and continue to work on exercise and weight loss efforts.  Hypertension We discussed sodium restriction, working on healthy weight loss, and a regular exercise program as the means to achieve improved blood pressure control. Max [name] agreed with this plan and agreed to follow-up as directed. We will continue to monitor his blood pressure as well as his progress with the above lifestyle modifications. Max [name] was instructed not to take his medications today and to call  his PCP about his blood pressure.   Obesity Max [name] is currently in the action stage of change. As such,  his goal is to continue with weight loss efforts. He has agreed to follow the Category 3 plan. Max [name] will work on meal planning and intentional eating. Max [name] has been instructed to continue his current exercise regimen for weight loss and overall health benefits. We discussed the following Behavioral Modification Strategies today: increasing lean protein intake, decreasing simple carbohydrates, increasing vegetables, increase H20 intake, decrease eating out, no skipping meals, work on meal planning and easy cooking plans, holiday eating strategies, and celebration eating strategies.  Max [name] has agreed to follow-up with our clinic in 2-3 weeks. He was informed of the importance of frequent follow-up visits to maximize his success with intensive lifestyle modifications for his multiple health conditions.  ALLERGIES: Allergies  Allergen Reactions  . Butane Anxiety and Other (See Comments)    Hallucinations REACTIONS ARE SIDE EFFECTS   . Gabapentin Other (See Comments)    Causes agression  . Hydromorphone Swelling    SWELLING REACTION UNSPECIFIED   . Tizanidine Swelling    SWELLING REACTION UNSPECIFIED   . Topamax [Topiramate] Other (See Comments)    Hallucinations   . Buprenorphine Other (See Comments)    UNSPECIFIED REACTION   . Pork-Derived Products     Pt. Refuses pork derived products  . Latex Rash    Localized rash    MEDICATIONS: Current Outpatient Medications on File Prior to Visit  Medication Sig Dispense Refill  . albuterol (VENTOLIN HFA) 108 (90 Base) MCG/ACT inhaler Inhale 2 puffs into the lungs every 6 (six) hours as needed for wheezing or shortness of breath. 6.7 g 0  . alfuzosin (UROXATRAL) 10 MG 24 hr tablet Take 10 mg by mouth daily with breakfast.    . amLODipine (NORVASC) 10 MG tablet Take 10 mg by mouth daily.     . Blood Glucose Monitoring Suppl (ONETOUCH VERIO) w/Device KIT 1 kit by Does not apply route daily at 12 noon. Continuous Glucose Monitoring Kit 1  kit 0  . chlorthalidone (HYGROTON) 25 MG tablet Take 1 tablet by mouth once daily 90 tablet 0  . Cholecalciferol (D3-1000 PO) Take 1 capsule by mouth daily.    . DULoxetine (CYMBALTA) 60 MG capsule Take 60 mg by mouth 2 (two) times daily.    . ferrous sulfate 325 (65 FE) MG EC tablet Take 325 mg by mouth daily with breakfast.    . Flaxseed Oil OIL 750 mg daily.    Marland Kitchen glucose blood (ACCU-CHEK AVIVA) test strip Use as instructed 100 each 0  . hydrOXYzine (VISTARIL) 25 MG capsule   1  . insulin regular (NOVOLIN R RELION) 100 units/mL injection Inject 0.3 mLs (30 Units total) into the skin 3 (three) times daily before meals. 27 mL 0  . Lancets (ACCU-CHEK SAFE-T PRO) lancets Use as instructed 100 each 0  . Levorphanol Tartrate 3 MG TABS Take 3 mg by mouth 3 (three) times daily.     Marland Kitchen losartan (COZAAR) 100 MG tablet Take 100 mg by mouth daily.    . Magnesium 400 MG TABS Take 1 tablet by mouth daily.    . naproxen sodium (ANAPROX) 220 MG tablet Take 220 mg by mouth daily as needed (for pain).     Marland Kitchen oxyCODONE (ROXICODONE) 15 MG immediate release tablet Take 10 mg by mouth 3 (three) times daily.     . Potassium 99 MG TABS Take 1 tablet by mouth daily.    Marland Kitchen  pravastatin (PRAVACHOL) 20 MG tablet Take 40 mg by mouth at bedtime.     . Probiotic Product (PROBIOTIC-10 PO) Take 1 capsule by mouth daily.    . tamsulosin (FLOMAX) 0.4 MG CAPS capsule Take 0.4 mg by mouth daily.     . [name] 1.5 HQ/4.6NG SOPN     . vitamin B-12 (CYANOCOBALAMIN) 1000 MCG tablet Take 1,000 mcg by mouth daily.     No current facility-administered medications on file prior to visit.     PAST MEDICAL HISTORY: Past Medical History:  Diagnosis Date  . ADD (attention deficit disorder)   . Anxiety   . Back pain   . Bipolar 1 disorder ([place])   . Chronic pain   . Constipation   . Decreased hearing   . Depression   . Diabetes mellitus without complication ([place])    Type II  . Dry skin   . DVT (deep venous thrombosis) ([place])  2004   leg (not sure which leg)  . Dyslipidemia   . Dyspnea    with exertion  . Ear drainage   . Excessive hunger   . Excessive thirst   . Eye pain   . Fatigue   . Frequent urination   . Hard of hearing   . Hyperlipidemia   . Hypertension   . Itching   . Joint pain   . Lactose intolerance   . Leg cramps   . Mouth sores   . Nasal and sinus discharge   . Nausea   . Numbness and tingling    bilateral  . OSA (obstructive sleep apnea)    wears CPAP  . Shortness of breath   . Shortness of breath on exertion   . Sinus congestion   . Stiff neck   . Stress   . Swallowing difficulty   . Swelling of extremity   . Tinnitus   . Trouble in sleeping   . Urinary frequency   . Vision changes   . Weakness   . Wears glasses     PAST SURGICAL HISTORY: Past Surgical History:  Procedure Laterality Date  . arm surgery Right   . BACK SURGERY  2004  . COLONOSCOPY    . ESOPHAGOGASTRODUODENOSCOPY    . EYE SURGERY Right    cataract  . SUBMANDIBULAR GLAND EXCISION Right 03/10/2016   Procedure: INTRAORAL  SUBMANDIBULAR  STONE REMOVAL;  Surgeon: Izora Gala, MD;  Location: [facility];  Service: ENT;  Laterality: Right;    SOCIAL HISTORY: Social History   Tobacco Use  . Smoking status: Never Smoker  . Smokeless tobacco: Never Used  Substance Use Topics  . Alcohol use: No  . Drug use: No    FAMILY HISTORY: Family History  Problem Relation Age of Onset  . Diabetes Mother   . Hypertension Mother   . Hyperlipidemia Mother   . Kidney disease Mother   . Cancer Mother   . Diabetes Brother    ROS: Review of Systems  Respiratory: Negative for shortness of breath.   Cardiovascular: Negative for chest pain and claudication.  Musculoskeletal: Negative for myalgias.  Neurological:       Negative for lightheadedness.   PHYSICAL EXAM: Pt in no acute distress  RECENT LABS AND TESTS: BMET    Component Value Date/Time   NA 139 05/15/2018 0946   K 4.1 05/15/2018 0946   CL 98  05/15/2018 0946   CO2 26 05/15/2018 0946   GLUCOSE 173 (H) 05/15/2018 0946   GLUCOSE 208 (H) 03/09/2016  1424   BUN 14 05/15/2018 0946   CREATININE 0.95 05/15/2018 0946   CALCIUM 9.2 05/15/2018 0946   GFRNONAA 81 05/15/2018 0946   GFRAA 93 05/15/2018 0946   Lab Results  Component Value Date   HGBA1C 7.6 (H) 12/11/2018   HGBA1C 7.7 (H) 05/15/2018   HGBA1C 8.3 (H) 09/28/2017   HGBA1C 7.2 (H) 03/09/2016   HGBA1C 7.9 08/12/2015   No results found for: INSULIN CBC    Component Value Date/Time   WBC 10.8 09/28/2017 1145   WBC 11.6 (H) 03/09/2016 1424   RBC 5.32 09/28/2017 1145   RBC 5.17 03/09/2016 1424   HGB 13.8 09/28/2017 1145   HCT 42.3 09/28/2017 1145   PLT 291 03/09/2016 1424   MCV 80 09/28/2017 1145   MCH 25.9 (L) 09/28/2017 1145   MCH 26.7 03/09/2016 1424   MCHC 32.6 09/28/2017 1145   MCHC 33.3 03/09/2016 1424   RDW 14.6 09/28/2017 1145   LYMPHSABS 1.7 09/28/2017 1145   EOSABS 0.1 09/28/2017 1145   BASOSABS 0.0 09/28/2017 1145   Iron/TIBC/Ferritin/ %Sat No results found for: IRON, TIBC, FERRITIN, IRONPCTSAT Lipid Panel     Component Value Date/Time   CHOL 176 12/11/2018 0840   TRIG 67 12/11/2018 0840   HDL 52 12/11/2018 0840   CHOLHDL 3.8 Ratio 01/09/2008 2228   VLDL 29 01/09/2008 2228   LDLCALC 111 (H) 12/11/2018 0840   Hepatic Function Panel     Component Value Date/Time   PROT 6.6 05/15/2018 0946   ALBUMIN 4.2 05/15/2018 0946   AST 16 05/15/2018 0946   ALT 17 05/15/2018 0946   ALKPHOS 94 05/15/2018 0946   BILITOT 0.2 05/15/2018 0946      Component Value Date/Time   TSH 0.471 09/28/2017 1145   TSH 0.499 02/15/2008 2052   TSH 0.106 (L) 01/09/2008 2228   Results for ANEL, PUROHIT A (MRN [id number]) as of 02/27/2019 15:07  Ref. Range 09/28/2017 11:45  Vitamin D, 25-Hydroxy Latest Ref Range: 30.0 - 100.0 ng/mL 50.1   I, Michaelene Song, am acting as Location manager for [employer], DO  I have reviewed the above documentation for accuracy and  completeness, and I agree with the above. -[name], DO

## 2019-03-01 ENCOUNTER — Ambulatory Visit
Admission: RE | Admit: 2019-03-01 | Discharge: 2019-03-01 | Disposition: A | Discharge status: Home / Self Care | Payer: Medicare Other | Source: Ambulatory Visit | Attending: Family Medicine | Admitting: Family Medicine

## 2019-03-01 DIAGNOSIS — Z87891 Personal history of nicotine dependence: Secondary | ICD-10-CM

## 2019-03-01 DIAGNOSIS — Z136 Encounter for screening for cardiovascular disorders: Secondary | ICD-10-CM

## 2019-05-08 ENCOUNTER — Other Ambulatory Visit (INDEPENDENT_AMBULATORY_CARE_PROVIDER_SITE_OTHER): Payer: Self-pay | Admitting: Bariatrics

## 2019-05-08 DIAGNOSIS — I10 Essential (primary) hypertension: Secondary | ICD-10-CM

## 2019-05-14 ENCOUNTER — Telehealth (INDEPENDENT_AMBULATORY_CARE_PROVIDER_SITE_OTHER): Payer: Self-pay | Admitting: Bariatrics

## 2019-05-14 NOTE — Telephone Encounter (Signed)
Patient's wife called to see why patient's refill for Chlortha was denied.  Explained to wife that he has not been seen since 12/1.  Patient is now scheduled for a audio visit with Dr. Owens Shark tomorrow, 2/16 at 8:00.  Thank you

## 2019-05-14 NOTE — Telephone Encounter (Signed)
Thank you :)

## 2019-05-15 ENCOUNTER — Telehealth (INDEPENDENT_AMBULATORY_CARE_PROVIDER_SITE_OTHER): Payer: Medicare Other | Admitting: Bariatrics

## 2019-05-15 ENCOUNTER — Encounter (INDEPENDENT_AMBULATORY_CARE_PROVIDER_SITE_OTHER): Payer: Self-pay | Admitting: Bariatrics

## 2019-05-15 ENCOUNTER — Other Ambulatory Visit: Payer: Self-pay

## 2019-05-15 DIAGNOSIS — E119 Type 2 diabetes mellitus without complications: Secondary | ICD-10-CM | POA: Diagnosis not present

## 2019-05-15 DIAGNOSIS — Z794 Long term (current) use of insulin: Secondary | ICD-10-CM

## 2019-05-15 DIAGNOSIS — I10 Essential (primary) hypertension: Secondary | ICD-10-CM

## 2019-05-15 DIAGNOSIS — Z6838 Body mass index (BMI) 38.0-38.9, adult: Secondary | ICD-10-CM

## 2019-05-15 MED ORDER — CHLORTHALIDONE 25 MG PO TABS: 25.0000 mg | ORAL_TABLET | Freq: Every day | ORAL | 0 refills | Status: DC

## 2019-05-15 NOTE — Progress Notes (Signed)
TeleHealth Visit:  Due to the COVID-19 pandemic, this visit was completed with telemedicine (audio/video) technology to reduce patient and provider exposure as well as to preserve personal protective equipment.   Max [name] has verbally consented to this TeleHealth visit. The patient is located at home, the provider is located at the [facility] office. The participants in this visit include the listed provider and patient. The visit was conducted today via telephone call.  Max [name] was unable to use realtime audiovisual technology today and the telehealth visit was conducted via telephone.  Chief Complaint: OBESITY Max [name] is here to discuss his progress with his obesity treatment plan along with follow-up of his obesity related diagnoses. Max [name] is on the Category 3 Plan and states he is following his eating plan approximately 70% of the time. Max [name] states he is exercising 0 minutes 0 times per week.  Today's visit was #: 27 Starting weight: 273 lbs Starting date: 09/28/2017  Interim History: Max [name] states that his weight remains the same (weight 270).  Subjective:   Essential hypertension. Blood pressure is reasonably well controlled.  BP Readings from Last 3 Encounters:  12/25/18 132/72  05/29/18 134/65  05/15/18 129/69   Lab Results  Component Value Date   CREATININE 0.95 05/15/2018   CREATININE 0.91 09/28/2017   CREATININE 0.98 03/09/2016   Type 2 diabetes mellitus without complication, with long-term current use of insulin (Max [name]). Max [name] is taking [name] and regular insulin.  He reports low blood sugars in the afternoon.  Lab Results  Component Value Date   HGBA1C 7.6 (H) 12/11/2018   HGBA1C 7.7 (H) 05/15/2018   HGBA1C 8.3 (H) 09/28/2017   Lab Results  Component Value Date   MICROALBUR 0.20 01/09/2008   LDLCALC 111 (H) 12/11/2018   CREATININE 0.95 05/15/2018   No results found for: INSULIN  Assessment/Plan:   Essential  hypertension. Max [name] is working on healthy weight loss and exercise to improve blood pressure control. We will watch for signs of hypotension as he continues his lifestyle modifications. He was given a prescription for chlorthalidone (HYGROTON) 25 MG tablet 1 PO daily #90 with 0 refills.  Type 2 diabetes mellitus without complication, with long-term current use of insulin (Max [name]). Good blood sugar control is important to decrease the likelihood of diabetic complications such as nephropathy, neuropathy, limb loss, blindness, coronary artery disease, and death. Intensive lifestyle modification including diet, exercise and weight loss are the first line of treatment for diabetes. Max [name] will continue his medications as prescribed. He was given a prescription for Novolin R 30 units in a.m.; decrease 25 units before lunch and 30 units before dinner.  Class 2 severe obesity with serious comorbidity and body mass index (BMI) of 38.0 to 38.9 in adult, unspecified obesity type (Max [name]).  Max [name] is currently in the action stage of change. As such, his goal is to continue with weight loss efforts. He has agreed to the Category 3 Plan.   Max [name] will check glucose before eating and decrease the amount of insulin if glucose is low (see DM). He will call the office if glucose is staying less than 80.   Exercise goals: Older adults should follow the adult guidelines. When older adults cannot meet the adult guidelines, they should be as physically active as their abilities and conditions will allow.   Behavioral modification strategies: increasing lean protein intake, decreasing simple carbohydrates, increasing vegetables, increasing water intake, decreasing eating out, no skipping meals, meal planning and cooking strategies, keeping healthy  foods in the home, ways to avoid night time snacking, emotional eating strategies and planning for success.  Max [name] has agreed to follow-up with our clinic in 2 weeks. He was  informed of the importance of frequent follow-up visits to maximize his success with intensive lifestyle modifications for his multiple health conditions.  Objective:   VITALS: Per patient if applicable, see vitals. GENERAL: Alert and in no acute distress. CARDIOPULMONARY: No increased WOB. Speaking in clear sentences.  PSYCH: Pleasant and cooperative. Speech normal rate and rhythm. Affect is appropriate. Insight and judgement are appropriate. Attention is focused, linear, and appropriate.  NEURO: Oriented as arrived to appointment on time with no prompting.   Lab Results  Component Value Date   CREATININE 0.95 05/15/2018   BUN 14 05/15/2018   NA 139 05/15/2018   K 4.1 05/15/2018   CL 98 05/15/2018   CO2 26 05/15/2018   Lab Results  Component Value Date   ALT 17 05/15/2018   AST 16 05/15/2018   ALKPHOS 94 05/15/2018   BILITOT 0.2 05/15/2018   Lab Results  Component Value Date   HGBA1C 7.6 (H) 12/11/2018   HGBA1C 7.7 (H) 05/15/2018   HGBA1C 8.3 (H) 09/28/2017   HGBA1C 7.2 (H) 03/09/2016   HGBA1C 7.9 08/12/2015   No results found for: INSULIN Lab Results  Component Value Date   TSH 0.471 09/28/2017   Lab Results  Component Value Date   CHOL 176 12/11/2018   HDL 52 12/11/2018   LDLCALC 111 (H) 12/11/2018   TRIG 67 12/11/2018   CHOLHDL 3.8 Ratio 01/09/2008   Lab Results  Component Value Date   WBC 10.8 09/28/2017   HGB 13.8 09/28/2017   HCT 42.3 09/28/2017   MCV 80 09/28/2017   PLT 291 03/09/2016   No results found for: IRON, TIBC, FERRITIN  Attestation Statements:   Reviewed by clinician on day of visit: allergies, medications, problem list, medical history, surgical history, family history, social history, and previous encounter notes.  Time spent on visit including pre-visit chart review and post-visit care was 20 minutes.   Migdalia Dk, am acting as Location manager for [employer], DO   I have reviewed the above documentation for accuracy and  completeness, and I agree with the above. [name], DO

## 2019-05-29 ENCOUNTER — Encounter (INDEPENDENT_AMBULATORY_CARE_PROVIDER_SITE_OTHER): Payer: Self-pay | Admitting: Family Medicine

## 2019-05-29 ENCOUNTER — Other Ambulatory Visit: Payer: Self-pay

## 2019-05-29 ENCOUNTER — Ambulatory Visit (INDEPENDENT_AMBULATORY_CARE_PROVIDER_SITE_OTHER): Payer: Medicare Other | Admitting: Bariatrics

## 2019-05-29 ENCOUNTER — Telehealth (INDEPENDENT_AMBULATORY_CARE_PROVIDER_SITE_OTHER): Payer: Medicare Other | Admitting: Family Medicine

## 2019-05-29 DIAGNOSIS — I10 Essential (primary) hypertension: Secondary | ICD-10-CM

## 2019-05-29 DIAGNOSIS — Z794 Long term (current) use of insulin: Secondary | ICD-10-CM | POA: Diagnosis not present

## 2019-05-29 DIAGNOSIS — E1165 Type 2 diabetes mellitus with hyperglycemia: Secondary | ICD-10-CM | POA: Diagnosis not present

## 2019-05-29 DIAGNOSIS — Z6841 Body Mass Index (BMI) 40.0 and over, adult: Secondary | ICD-10-CM

## 2019-06-04 NOTE — Progress Notes (Signed)
TeleHealth Visit:  Due to the COVID-19 pandemic, this visit was completed with telemedicine (audio/video) technology to reduce patient and provider exposure as well as to preserve personal protective equipment.   [name] has verbally consented to this TeleHealth visit. The patient is located at home, the provider is located at the [facility] office. The participants in this visit include the listed provider, patient, wife Ronney Lion) and any and all parties involved. The visit was conducted today via telephone.  Cully was unable to use realtime audiovisual technology today and the telehealth visit was conducted via telephone.  Chief Complaint: OBESITY Jamiyl is here to discuss his progress with his obesity treatment plan along with follow-up of his obesity related diagnoses. Frederik is on the Category 3 Plan and states he is following his eating plan approximately 40 to 60% of the time. Yazen states he is exercising 0 minutes 0 times per week.  Today's visit was #: 28 Starting weight: 273 lbs Starting date: 09/28/2017  Interim History: Pepper has received both COVID vaccines. He is having significant pain that he is trying to overcome. He recently started Pregabalin and he is having emotional hunger. Patient is eating a good amount of vegetables and fruits, but not as much protein as previously. He reports a weight of 261 pounds today.  Subjective:   Type 2 diabetes mellitus with hyperglycemia, with long-term current use of insulin (HCC) Yakir's blood sugars are labile, and range between 51 and 130's. He does report some low blood sugars, especially in the morning.  Lab Results  Component Value Date   HGBA1C 7.6 (H) 12/11/2018   HGBA1C 7.7 (H) 05/15/2018   HGBA1C 8.3 (H) 09/28/2017   Lab Results  Component Value Date   MICROALBUR 0.20 01/09/2008   LDLCALC 111 (H) 12/11/2018   CREATININE 0.95 05/15/2018   No results found for: INSULIN  Essential  hypertension Epic's blood pressure was controlled at the last office visits, but he has not been seen since September 2020. He denies chest pain, chest pressure or headache.  BP Readings from Last 3 Encounters:  12/25/18 132/72  05/29/18 134/65  05/15/18 129/69   Lab Results  Component Value Date   CREATININE 0.95 05/15/2018   CREATININE 0.91 09/28/2017   CREATININE 0.98 03/09/2016   Assessment/Plan:   Type 2 diabetes mellitus with hyperglycemia, with long-term current use of insulin (HCC) Good blood sugar control is important to decrease the likelihood of diabetic complications such as nephropathy, neuropathy, limb loss, blindness, coronary artery disease, and death. Intensive lifestyle modification including diet, exercise and weight loss are the first line of treatment for diabetes. Eliza will continue his current medications, with no change. If his blood sugars continue to   Essential hypertension Jayvonte is working on healthy weight loss and exercise to improve blood pressure control. Calyn will continue his current medications and we will follow up at the first in-person appointment. We will watch for signs of hypotension as he continues his lifestyle modifications.  Class 3 severe obesity with serious comorbidity and body mass index (BMI) of 40.0 to 44.9 in adult, unspecified obesity type [facility]) Corday is currently in the action stage of change. As such, his goal is to continue with weight loss efforts. He has agreed to the Category 3 Plan.   Exercise goals: No exercise has been prescribed at this time.  Behavioral modification strategies: increasing lean protein intake, increasing vegetables, meal planning and cooking strategies, keeping healthy foods in the home  and planning for success.  Trimaine has agreed to follow-up with our clinic in 2 weeks. He was informed of the importance of frequent follow-up visits to maximize his success with intensive lifestyle modifications  for his multiple health conditions.  Objective:   VITALS: Per patient if applicable, see vitals. GENERAL: Alert and in no acute distress. CARDIOPULMONARY: No increased WOB. Speaking in clear sentences.  PSYCH: Pleasant and cooperative. Speech normal rate and rhythm. Affect is appropriate. Insight and judgement are appropriate. Attention is focused, linear, and appropriate.  NEURO: Oriented as arrived to appointment on time with no prompting.   Lab Results  Component Value Date   CREATININE 0.95 05/15/2018   BUN 14 05/15/2018   NA 139 05/15/2018   K 4.1 05/15/2018   CL 98 05/15/2018   CO2 26 05/15/2018   Lab Results  Component Value Date   ALT 17 05/15/2018   AST 16 05/15/2018   ALKPHOS 94 05/15/2018   BILITOT 0.2 05/15/2018   Lab Results  Component Value Date   HGBA1C 7.6 (H) 12/11/2018   HGBA1C 7.7 (H) 05/15/2018   HGBA1C 8.3 (H) 09/28/2017   HGBA1C 7.2 (H) 03/09/2016   HGBA1C 7.9 08/12/2015   No results found for: INSULIN Lab Results  Component Value Date   TSH 0.471 09/28/2017   Lab Results  Component Value Date   CHOL 176 12/11/2018   HDL 52 12/11/2018   LDLCALC 111 (H) 12/11/2018   TRIG 67 12/11/2018   CHOLHDL 3.8 Ratio 01/09/2008   Lab Results  Component Value Date   WBC 10.8 09/28/2017   HGB 13.8 09/28/2017   HCT 42.3 09/28/2017   MCV 80 09/28/2017   PLT 291 03/09/2016   No results found for: IRON, TIBC, FERRITIN    Ref. Range 09/28/2017 11:45  Vitamin D, 25-Hydroxy Latest Ref Range: 30.0 - 100.0 ng/mL 50.1    Attestation Statements:   Reviewed by clinician on day of visit: allergies, medications, problem list, medical history, surgical history, family history, social history, and previous encounter notes.  Time spent on visit including pre-visit chart review and post-visit charting and care was 18 minutes.   I, Doreene Nest, am acting as transcriptionist for [name], MD.  I have reviewed the above documentation for accuracy and  completeness, and I agree with the above. Ilene Qua, MD' '

## 2019-06-12 ENCOUNTER — Other Ambulatory Visit: Payer: Self-pay

## 2019-06-12 ENCOUNTER — Telehealth (INDEPENDENT_AMBULATORY_CARE_PROVIDER_SITE_OTHER): Payer: Medicare Other | Admitting: Family Medicine

## 2019-06-12 ENCOUNTER — Encounter (INDEPENDENT_AMBULATORY_CARE_PROVIDER_SITE_OTHER): Payer: Self-pay | Admitting: Family Medicine

## 2019-06-12 DIAGNOSIS — Z6841 Body Mass Index (BMI) 40.0 and over, adult: Secondary | ICD-10-CM

## 2019-06-12 DIAGNOSIS — Z794 Long term (current) use of insulin: Secondary | ICD-10-CM | POA: Diagnosis not present

## 2019-06-12 DIAGNOSIS — E1165 Type 2 diabetes mellitus with hyperglycemia: Secondary | ICD-10-CM

## 2019-06-12 DIAGNOSIS — I10 Essential (primary) hypertension: Secondary | ICD-10-CM | POA: Diagnosis not present

## 2019-06-12 NOTE — Progress Notes (Signed)
TeleHealth Visit:  Due to the COVID-19 pandemic, this visit was completed with telemedicine (audio/video) technology to reduce patient and provider exposure as well as to preserve personal protective equipment.   Max [name] has verbally consented to this TeleHealth visit. The patient is located at home, the provider is located at the [facility] office. The participants in this visit include the listed provider and patient. The visit was conducted today via telephone call (16 minutes).  Max [name] was unable to use realtime audiovisual technology today and the telehealth visit was conducted via telephone.  Chief Complaint: OBESITY Max [name] is here to discuss his progress with his obesity treatment plan along with follow-up of his obesity related diagnoses. Max [name] is on the Category 3 Plan and states he is following his eating plan approximately 40% of the time. Max [name] states he is doing the stairs for 15 minutes 2-3 times a week and walking for 30-40 minutes 3 times per week.  Today's visit was #: 28 Starting weight: 273 lbs Starting date: 09/28/2017  Interim History: Max [name] says he fell of the meal plan and was doing some eating out.  He has been eating more yogurt.  He is now doing boiled eggs in the morning, salad with chopped chicken or fish, and 10 ounce meat with salad at dinner.  He has been eating peanut butter cups of smooth peanut butter in terms of snacks.  Subjective:   1. Type 2 diabetes mellitus with hyperglycemia, with long-term current use of insulin (HCC) Fasting blood sugar 107.  He has had frequent lows and has taken glucose tablets but not seeing quick rise in blood sugar.  Having most lows during the night.  He is not feeling low glucose.  He attributes this to pregabalin.  He is doing what sounds like a sliding scale for insulin.  Lab Results  Component Value Date   HGBA1C 7.6 (H) 12/11/2018   HGBA1C 7.7 (H) 05/15/2018   HGBA1C 8.3 (H) 09/28/2017   Lab  Results  Component Value Date   MICROALBUR 0.20 01/09/2008   LDLCALC 111 (H) 12/11/2018   CREATININE 0.95 05/15/2018   2. Essential hypertension Review: taking medications as instructed, no medication side effects noted, no chest pain on exertion, no dyspnea on exertion, no swelling of ankles.  Blood pressure well controlled previously.  Not reported this appointment.  BP Readings from Last 3 Encounters:  12/25/18 132/72  05/29/18 134/65  05/15/18 129/69   Assessment/Plan:   1. Type 2 diabetes mellitus with hyperglycemia, with long-term current use of insulin (HCC) Good blood sugar control is important to decrease the likelihood of diabetic complications such as nephropathy, neuropathy, limb loss, blindness, coronary artery disease, and death. Intensive lifestyle modification including diet, exercise and weight loss are the first line of treatment for diabetes.   2. Essential hypertension Max [name] is working on healthy weight loss and exercise to improve blood pressure control. We will watch for signs of hypotension as he continues his lifestyle modifications.  3. Class 3 severe obesity with serious comorbidity and body mass index (BMI) of 40.0 to 44.9 in adult, unspecified obesity type [name]) Max [name] is currently in the action stage of change. As such, his goal is to continue with weight loss efforts. He has agreed to the Category 3 Plan.   Exercise goals: No exercise has been prescribed at this time.  Behavioral modification strategies: increasing lean protein intake, increasing vegetables, meal planning and cooking strategies, keeping healthy foods in the home and planning  for success.  Max [name] has agreed to follow-up with our clinic in 2 weeks. He was informed of the importance of frequent follow-up visits to maximize his success with intensive lifestyle modifications for his multiple health conditions.  Objective:   VITALS: Per patient if applicable, see vitals. GENERAL: Alert  and in no acute distress. CARDIOPULMONARY: No increased WOB. Speaking in clear sentences.  PSYCH: Pleasant and cooperative. Speech normal rate and rhythm. Affect is appropriate. Insight and judgement are appropriate. Attention is focused, linear, and appropriate.  NEURO: Oriented as arrived to appointment on time with no prompting.   Lab Results  Component Value Date   CREATININE 0.95 05/15/2018   BUN 14 05/15/2018   NA 139 05/15/2018   K 4.1 05/15/2018   CL 98 05/15/2018   CO2 26 05/15/2018   Lab Results  Component Value Date   ALT 17 05/15/2018   AST 16 05/15/2018   ALKPHOS 94 05/15/2018   BILITOT 0.2 05/15/2018   Lab Results  Component Value Date   HGBA1C 7.6 (H) 12/11/2018   HGBA1C 7.7 (H) 05/15/2018   HGBA1C 8.3 (H) 09/28/2017   HGBA1C 7.2 (H) 03/09/2016   HGBA1C 7.9 08/12/2015   Lab Results  Component Value Date   TSH 0.471 09/28/2017   Lab Results  Component Value Date   CHOL 176 12/11/2018   HDL 52 12/11/2018   LDLCALC 111 (H) 12/11/2018   TRIG 67 12/11/2018   CHOLHDL 3.8 Ratio 01/09/2008   Lab Results  Component Value Date   WBC 10.8 09/28/2017   HGB 13.8 09/28/2017   HCT 42.3 09/28/2017   MCV 80 09/28/2017   PLT 291 03/09/2016   Attestation Statements:   Reviewed by clinician on day of visit: allergies, medications, problem list, medical history, surgical history, family history, social history, and previous encounter notes.  I, Water quality scientist, CMA, am acting as transcriptionist for [name], MD. I have reviewed the above documentation for accuracy and completeness, and I agree with the above. - Ilene Qua, MD

## 2019-06-26 ENCOUNTER — Telehealth (INDEPENDENT_AMBULATORY_CARE_PROVIDER_SITE_OTHER): Payer: Medicare Other | Admitting: Family Medicine

## 2019-06-26 ENCOUNTER — Encounter (INDEPENDENT_AMBULATORY_CARE_PROVIDER_SITE_OTHER): Payer: Self-pay | Admitting: Family Medicine

## 2019-06-26 ENCOUNTER — Other Ambulatory Visit: Payer: Self-pay

## 2019-06-26 DIAGNOSIS — Z6841 Body Mass Index (BMI) 40.0 and over, adult: Secondary | ICD-10-CM

## 2019-06-26 DIAGNOSIS — I10 Essential (primary) hypertension: Secondary | ICD-10-CM

## 2019-06-26 DIAGNOSIS — E1165 Type 2 diabetes mellitus with hyperglycemia: Secondary | ICD-10-CM

## 2019-06-26 DIAGNOSIS — Z794 Long term (current) use of insulin: Secondary | ICD-10-CM | POA: Diagnosis not present

## 2019-06-26 MED ORDER — CHLORTHALIDONE 25 MG PO TABS: 25.0000 mg | ORAL_TABLET | Freq: Every day | ORAL | 0 refills | Status: DC

## 2019-06-26 NOTE — Progress Notes (Signed)
TeleHealth Visit:  Due to the COVID-19 pandemic, this visit was completed with telemedicine (audio/video) technology to reduce patient and provider exposure as well as to preserve personal protective equipment.   Max [name] has verbally consented to this TeleHealth visit. The patient is located at home, the provider is located at the [facility] office. The participants in this visit include the listed provider and patient and Max [name]. The visit was conducted today via telephone call.  Max [name] was unable to use realtime audiovisual technology today and the telehealth visit was conducted via telephone.  Chief Complaint: OBESITY Max [name] is here to discuss his progress with his obesity treatment plan along with follow-up of his obesity related diagnoses. Max [name] is on the Category 3 Plan and states he is following his eating plan approximately 50% of the time. Max [name] states he is walking for 30 minutes 3 times per week.  Today's visit was #: 8 Starting weight: 273 lbs Starting date: 09/28/2017  Interim History: Max [name] reports a weight of 259 pounds this morning.  He says he went out of town recently and voices he did quite a bit of indulgent eating when visiting his son.  He has tried to be more stringent on the eating plan but pain has been a big obstacle.  Subjective:   1. Essential hypertension His blood pressure was previously well-controlled, and he reports that it is controlled at home.  No chest pain, chest pressure, headache.  He did not report his blood pressure today.  BP Readings from Last 3 Encounters:  12/25/18 132/72  05/29/18 134/65  05/15/18 129/69   2. Type 2 diabetes mellitus with hyperglycemia, with long-term current use of insulin (HCC) Blood sugars have been very well-controlled.  The patient reports that he has been out of insulin and his highest blood sugar has been 199.  Fasting blood sugar mostly in 120s.  No hypoglycemia.  He has stopped his nightly  insulin.  Lab Results  Component Value Date   HGBA1C 7.6 (H) 12/11/2018   HGBA1C 7.7 (H) 05/15/2018   HGBA1C 8.3 (H) 09/28/2017   Lab Results  Component Value Date   MICROALBUR 0.20 01/09/2008   LDLCALC 111 (H) 12/11/2018   CREATININE 0.95 05/15/2018   Assessment/Plan:   1. Essential hypertension Max [name] is working on healthy weight loss and exercise to improve blood pressure control. We will watch for signs of hypotension as he continues his lifestyle modifications. - chlorthalidone (HYGROTON) 25 MG tablet; Take 1 tablet (25 mg total) by mouth daily.  Dispense: 90 tablet; Refill: 0  2. Type 2 diabetes mellitus with hyperglycemia, with long-term current use of insulin (HCC) Good blood sugar control is important to decrease the likelihood of diabetic complications such as nephropathy, neuropathy, limb loss, blindness, coronary artery disease, and death. Intensive lifestyle modification including diet, exercise and weight loss are the first line of treatment for diabetes.   3. Class 3 severe obesity with serious comorbidity and body mass index (BMI) of 40.0 to 44.9 in adult, unspecified obesity type [facility]) Max [name] is currently in the action stage of change. As such, his goal is to continue with weight loss efforts. He has agreed to the Category 3 Plan.   Exercise goals: As is.  Behavioral modification strategies: increasing lean protein intake, increasing vegetables, meal planning and cooking strategies and planning for success.  Max [name] has agreed to follow-up with our clinic in 3 weeks. He was informed of the importance of frequent follow-up visits to maximize his success  with intensive lifestyle modifications for his multiple health conditions.  Objective:   VITALS: Per patient if applicable, see vitals. GENERAL: Alert and in no acute distress. CARDIOPULMONARY: No increased WOB. Speaking in clear sentences.  PSYCH: Pleasant and cooperative. Speech normal rate and rhythm. Affect  is appropriate. Insight and judgement are appropriate. Attention is focused, linear, and appropriate.  NEURO: Oriented as arrived to appointment on time with no prompting.   Lab Results  Component Value Date   CREATININE 0.95 05/15/2018   BUN 14 05/15/2018   NA 139 05/15/2018   K 4.1 05/15/2018   CL 98 05/15/2018   CO2 26 05/15/2018   Lab Results  Component Value Date   ALT 17 05/15/2018   AST 16 05/15/2018   ALKPHOS 94 05/15/2018   BILITOT 0.2 05/15/2018   Lab Results  Component Value Date   HGBA1C 7.6 (H) 12/11/2018   HGBA1C 7.7 (H) 05/15/2018   HGBA1C 8.3 (H) 09/28/2017   HGBA1C 7.2 (H) 03/09/2016   HGBA1C 7.9 08/12/2015   Lab Results  Component Value Date   TSH 0.471 09/28/2017   Lab Results  Component Value Date   CHOL 176 12/11/2018   HDL 52 12/11/2018   LDLCALC 111 (H) 12/11/2018   TRIG 67 12/11/2018   CHOLHDL 3.8 Ratio 01/09/2008   Lab Results  Component Value Date   WBC 10.8 09/28/2017   HGB 13.8 09/28/2017   HCT 42.3 09/28/2017   MCV 80 09/28/2017   PLT 291 03/09/2016   Attestation Statements:   Reviewed by clinician on day of visit: allergies, medications, problem list, medical history, surgical history, family history, social history, and previous encounter notes.  Time spent on visit including pre-visit chart review and post-visit charting and care was 17 minutes.   I, Water quality scientist, CMA, am acting as transcriptionist for [name], MD.  I have reviewed the above documentation for accuracy and completeness, and I agree with the above. - Ilene Qua, MD

## 2019-06-28 ENCOUNTER — Ambulatory Visit (INDEPENDENT_AMBULATORY_CARE_PROVIDER_SITE_OTHER): Payer: Medicare Other | Admitting: Family Medicine

## 2019-07-12 ENCOUNTER — Ambulatory Visit (INDEPENDENT_AMBULATORY_CARE_PROVIDER_SITE_OTHER): Payer: Medicare Other | Admitting: Family Medicine

## 2019-07-12 ENCOUNTER — Other Ambulatory Visit: Payer: Self-pay

## 2019-07-12 ENCOUNTER — Encounter (INDEPENDENT_AMBULATORY_CARE_PROVIDER_SITE_OTHER): Payer: Self-pay | Admitting: Family Medicine

## 2019-07-12 VITALS — BP 123/68 | HR 94 | Temp 98.0°F | Ht 70.0 in | Wt 247.0 lb

## 2019-07-12 DIAGNOSIS — E1165 Type 2 diabetes mellitus with hyperglycemia: Secondary | ICD-10-CM

## 2019-07-12 DIAGNOSIS — I10 Essential (primary) hypertension: Secondary | ICD-10-CM

## 2019-07-12 DIAGNOSIS — Z6835 Body mass index (BMI) 35.0-35.9, adult: Secondary | ICD-10-CM

## 2019-07-12 DIAGNOSIS — Z794 Long term (current) use of insulin: Secondary | ICD-10-CM

## 2019-07-16 NOTE — Progress Notes (Signed)
Chief Complaint:   OBESITY Max [name] is here to discuss his progress with his obesity treatment plan along with follow-up of his obesity related diagnoses. Max [name] is on the Category 3 Plan and states he is following his eating plan approximately 60-70% of the time. Max [name] states he is walking for 30-40 minutes 3-5 times per week.  Today's visit was #: 45 Starting weight: 273 lbs Starting date: 09/28/2017 Today's weight: 247 lbs Today's date: 07/12/2019 Total lbs lost to date: 26 Total lbs lost since last in-office visit: 21  Interim History: Aspen has returned to our clinic today for the first times since the start of [place]. He has been eating at home with minimal eating off the plan. At least 30-40% he is making some alterations to the meal plan. He mentions he has significant nausea and had to start taking Pepto Bismol and ginger. He reports he believes this has caused black stool, so he stopped taking this to see if this changed his stool color.  Subjective:   1. Type 2 diabetes mellitus with hyperglycemia, with long-term current use of insulin (HCC) Max [name]'s fasting BGs range between 110's and 150. He is having nausea. He is on [name], but he stopped taking insulin completely until yesterday because he drank a regular soda yesterday. However, he is having severe nausea and fairly consistent emesis.  2. Essential hypertension Max [name]'s blood pressure is well controlled. He denies chest pain, chest pressure, or headaches. He is on losartan, chlorthalidone, and amlodipine.  Assessment/Plan:   1. Type 2 diabetes mellitus with hyperglycemia, with long-term current use of insulin (HCC) Good blood sugar control is important to decrease the likelihood of diabetic complications such as nephropathy, neuropathy, limb loss, blindness, coronary artery disease, and death. Intensive lifestyle modification including diet, exercise and weight loss are the first line of treatment for diabetes.  Max [name] is to follow up with his Endocrinologist to discuss decreasing [name] to [id number] mg, to see if that improves nausea.  2. Essential hypertension Max [name] is working on healthy weight loss and exercise to improve blood pressure control. We will watch for signs of hypotension as he continues his lifestyle modifications. Max [name] will continue his current medications, and no changes to dosage was made.  3. Class 2 severe obesity with serious comorbidity and body mass index (BMI) of 35.0 to 35.9 in adult, unspecified obesity type Max [name]) Max [name] is currently in the action stage of change. As such, his goal is to continue with weight loss efforts. He has agreed to the Category 3 Plan.   Exercise goals: As is.  Behavioral modification strategies: increasing lean protein intake, meal planning and cooking strategies and planning for success.  Aquino has agreed to follow-up with our clinic in 2 weeks. He was informed of the importance of frequent follow-up visits to maximize his success with intensive lifestyle modifications for his multiple health conditions.   Objective:   Blood pressure 123/68, pulse 94, temperature 98 F (36.7 C), temperature source Oral, height 5\' 10"  (1.778 m), weight 247 lb (112 kg), SpO2 96 %. Body mass index is 35.44 kg/m.  General: Cooperative, alert, well developed, in no acute distress. HEENT: Conjunctivae and lids unremarkable. Cardiovascular: Regular rhythm.  Lungs: Normal work of breathing. Neurologic: No focal deficits.   Lab Results  Component Value Date   CREATININE 0.95 05/15/2018   BUN 14 05/15/2018   NA 139 05/15/2018   K 4.1 05/15/2018   CL 98 05/15/2018   CO2 26 05/15/2018   Lab  Results  Component Value Date   ALT 17 05/15/2018   AST 16 05/15/2018   ALKPHOS 94 05/15/2018   BILITOT 0.2 05/15/2018   Lab Results  Component Value Date   HGBA1C 7.6 (H) 12/11/2018   HGBA1C 7.7 (H) 05/15/2018   HGBA1C 8.3 (H) 09/28/2017   HGBA1C 7.2 (H)  03/09/2016   HGBA1C 7.9 08/12/2015   No results found for: INSULIN Lab Results  Component Value Date   TSH 0.471 09/28/2017   Lab Results  Component Value Date   CHOL 176 12/11/2018   HDL 52 12/11/2018   LDLCALC 111 (H) 12/11/2018   TRIG 67 12/11/2018   CHOLHDL 3.8 Ratio 01/09/2008   Lab Results  Component Value Date   WBC 10.8 09/28/2017   HGB 13.8 09/28/2017   HCT 42.3 09/28/2017   MCV 80 09/28/2017   PLT 291 03/09/2016   No results found for: IRON, TIBC, FERRITIN  Attestation Statements:   Reviewed by clinician on day of visit: allergies, medications, problem list, medical history, surgical history, family history, social history, and previous encounter notes.  Time spent on visit including pre-visit chart review and post-visit care and charting was 15 minutes.    I, Trixie Dredge, am acting as transcriptionist for April Manson, MD.  I have reviewed the above documentation for accuracy and completeness, and I agree with the above. - Max Qua, MD

## 2019-07-31 ENCOUNTER — Other Ambulatory Visit: Payer: Self-pay

## 2019-07-31 ENCOUNTER — Encounter (INDEPENDENT_AMBULATORY_CARE_PROVIDER_SITE_OTHER): Payer: Self-pay | Admitting: Family Medicine

## 2019-07-31 ENCOUNTER — Ambulatory Visit (INDEPENDENT_AMBULATORY_CARE_PROVIDER_SITE_OTHER): Payer: Medicare Other | Admitting: Family Medicine

## 2019-07-31 VITALS — BP 145/76 | HR 82 | Temp 98.3°F | Ht 70.0 in | Wt 252.0 lb

## 2019-07-31 DIAGNOSIS — R110 Nausea: Secondary | ICD-10-CM | POA: Diagnosis not present

## 2019-07-31 DIAGNOSIS — E1165 Type 2 diabetes mellitus with hyperglycemia: Secondary | ICD-10-CM

## 2019-07-31 DIAGNOSIS — Z794 Long term (current) use of insulin: Secondary | ICD-10-CM

## 2019-07-31 DIAGNOSIS — I10 Essential (primary) hypertension: Secondary | ICD-10-CM | POA: Diagnosis not present

## 2019-07-31 DIAGNOSIS — Z6836 Body mass index (BMI) 36.0-36.9, adult: Secondary | ICD-10-CM | POA: Diagnosis not present

## 2019-07-31 MED ORDER — CHLORTHALIDONE 25 MG PO TABS: 25.0000 mg | ORAL_TABLET | Freq: Every day | ORAL | 0 refills | Status: DC

## 2019-07-31 NOTE — Progress Notes (Signed)
Chief Complaint:   OBESITY Max [name] is here to discuss his progress with his obesity treatment plan along with follow-up of his obesity related diagnoses. Max [name] is on the Category 3 Plan and states he is following his eating plan approximately 60-70% of the time. Max [name] states he is walking for 35-40 minutes 3 times per week.  Today's visit was #: 51 Starting weight: 273 lbs Starting date: 09/28/2017 Today's weight: 252 lbs Today's date: 07/31/2019 Total lbs lost to date: 21 lbs Total lbs lost since last in-office visit: 0  Interim History: Max [name] says the last few weeks he has been intermittently trying to stay on the plan but is splurging.  He is doing eggs in the morning or a green salad with chicken at lunchtime.  He is still experiencing intermittent nausea even with the decrease in [name].  He is having so much nausea that he cannot tolerate much by mouth, therefore, he hasn't been able to follow the plan.  Subjective:   1. Essential hypertension Review: taking medications as instructed, no medication side effects noted, no chest pain on exertion, no dyspnea on exertion, no swelling of ankles.  Blood pressure is slightly elevated today.   BP Readings from Last 3 Encounters:  07/31/19 (!) 145/76  07/12/19 123/68  12/25/18 132/72   2. Type 2 diabetes mellitus with hyperglycemia, with long-term current use of insulin (HCC) Average blood sugar is around 126.  He is rarely taking insulin (around 20 units in the morning and only occasional insulin in the evening).  He had a few low blood sugars prior to adjusting his insulin.  3. Nausea Max [name] was given a medication for nausea by the pain clinic, but he is still experiencing very significant, consistent nausea.  He believes he has ondansetron.  Assessment/Plan:   1. Essential hypertension Max [name] is working on healthy weight loss and exercise to improve blood pressure control. We will watch for signs of hypotension as he  continues his lifestyle modifications. - chlorthalidone (HYGROTON) 25 MG tablet; Take 1 tablet (25 mg total) by mouth daily.  Dispense: 90 tablet; Refill: 0  2. Type 2 diabetes mellitus with hyperglycemia, with long-term current use of insulin (HCC) Increase [name] to 1.5 mg subcu weekly.  3. Nausea Max [name] will start taking ondansetron twice daily to help control consistent nausea.  4. Class 2 severe obesity with serious comorbidity and body mass index (BMI) of 36.0 to 36.9 in adult, unspecified obesity type [facility]) Max [name] is currently in the action stage of change. As such, his goal is to continue with weight loss efforts. He has agreed to the Category 3 Plan.   Exercise goals: As is.  Behavioral modification strategies: increasing lean protein intake, meal planning and cooking strategies and keeping healthy foods in the home.  Max [name] has agreed to follow-up with our clinic in 2-3 weeks. He was informed of the importance of frequent follow-up visits to maximize his success with intensive lifestyle modifications for his multiple health conditions.   Objective:   Blood pressure (!) 145/76, pulse 82, temperature 98.3 F (36.8 C), temperature source Oral, height 5\' 10"  (1.778 m), weight 252 lb (114.3 kg), SpO2 95 %. Body mass index is 36.16 kg/m.  General: Cooperative, alert, well developed, in no acute distress. HEENT: Conjunctivae and lids unremarkable. Cardiovascular: Regular rhythm.  Lungs: Normal work of breathing. Neurologic: No focal deficits.   Lab Results  Component Value Date   CREATININE 0.95 05/15/2018   BUN 14 05/15/2018   NA  139 05/15/2018   K 4.1 05/15/2018   CL 98 05/15/2018   CO2 26 05/15/2018   Lab Results  Component Value Date   ALT 17 05/15/2018   AST 16 05/15/2018   ALKPHOS 94 05/15/2018   BILITOT 0.2 05/15/2018   Lab Results  Component Value Date   HGBA1C 7.6 (H) 12/11/2018   HGBA1C 7.7 (H) 05/15/2018   HGBA1C 8.3 (H) 09/28/2017   HGBA1C 7.2  (H) 03/09/2016   HGBA1C 7.9 08/12/2015   Lab Results  Component Value Date   TSH 0.471 09/28/2017   Lab Results  Component Value Date   CHOL 176 12/11/2018   HDL 52 12/11/2018   LDLCALC 111 (H) 12/11/2018   TRIG 67 12/11/2018   CHOLHDL 3.8 Ratio 01/09/2008   Lab Results  Component Value Date   WBC 10.8 09/28/2017   HGB 13.8 09/28/2017   HCT 42.3 09/28/2017   MCV 80 09/28/2017   PLT 291 03/09/2016   Obesity Behavioral Intervention Documentation for Insurance:   Approximately 15 minutes were spent on the discussion below.  ASK: We discussed the diagnosis of obesity with Max [name] today and Max [name] agreed to give [name] permission to discuss obesity behavioral modification therapy today.  ASSESS: Max [name] has the diagnosis of obesity and his BMI today is 36.2. Max [name] is in the action stage of change.   ADVISE: Max [name] was educated on the multiple health risks of obesity as well as the benefit of weight loss to improve his health. He was advised of the need for long term treatment and the importance of lifestyle modifications to improve his current health and to decrease his risk of future health problems.  AGREE: Multiple dietary modification options and treatment options were discussed and Max [name] agreed to follow the recommendations documented in the above note.  ARRANGE: Max [name] was educated on the importance of frequent visits to treat obesity as outlined per CMS and USPSTF guidelines and agreed to schedule his next follow up appointment today.  Attestation Statements:   Reviewed by clinician on day of visit: allergies, medications, problem list, medical history, surgical history, family history, social history, and previous encounter notes.  I, Water quality scientist, CMA, am acting as transcriptionist for [name], MD.  I have reviewed the above documentation for accuracy and completeness, and I agree with the above. - Jinny Blossom, MD

## 2019-08-16 ENCOUNTER — Other Ambulatory Visit: Payer: Self-pay

## 2019-08-16 ENCOUNTER — Ambulatory Visit (INDEPENDENT_AMBULATORY_CARE_PROVIDER_SITE_OTHER): Payer: Medicare Other | Admitting: Bariatrics

## 2019-08-16 ENCOUNTER — Encounter (INDEPENDENT_AMBULATORY_CARE_PROVIDER_SITE_OTHER): Payer: Self-pay | Admitting: Bariatrics

## 2019-08-16 VITALS — BP 138/73 | HR 88 | Temp 98.0°F | Ht 70.0 in | Wt 249.0 lb

## 2019-08-16 DIAGNOSIS — E669 Obesity, unspecified: Secondary | ICD-10-CM

## 2019-08-16 DIAGNOSIS — R110 Nausea: Secondary | ICD-10-CM

## 2019-08-16 DIAGNOSIS — Z6835 Body mass index (BMI) 35.0-35.9, adult: Secondary | ICD-10-CM | POA: Diagnosis not present

## 2019-08-16 DIAGNOSIS — I10 Essential (primary) hypertension: Secondary | ICD-10-CM | POA: Diagnosis not present

## 2019-08-16 DIAGNOSIS — E66812 Obesity, class 2: Secondary | ICD-10-CM

## 2019-08-16 DIAGNOSIS — E1169 Type 2 diabetes mellitus with other specified complication: Secondary | ICD-10-CM

## 2019-08-16 DIAGNOSIS — E119 Type 2 diabetes mellitus without complications: Secondary | ICD-10-CM

## 2019-08-16 MED ORDER — ONDANSETRON HCL 4 MG PO TABS: 4.0000 mg | ORAL_TABLET | Freq: Three times a day (TID) | ORAL | 1 refills | Status: DC | PRN

## 2019-08-20 ENCOUNTER — Encounter (INDEPENDENT_AMBULATORY_CARE_PROVIDER_SITE_OTHER): Payer: Self-pay | Admitting: Bariatrics

## 2019-08-20 NOTE — Progress Notes (Signed)
Chief Complaint:   OBESITY Max [name] is here to discuss his progress with his obesity treatment plan along with follow-up of his obesity related diagnoses. Max [name] is on the Category 3 Plan and states he is following his eating plan approximately 70% of the time. Max [name] states he is exercising 0 minutes 0 times per week.  Today's visit was #: 70 Starting weight: 273 lbs Starting date: 09/28/2017 Today's weight: 249 lbs Today's date: 08/16/2019 Total lbs lost to date: 24 Total lbs lost since last in-office visit: 3  Interim History: Max [name] is down 3 lbs. He reports drinking adequate water.  Subjective:   Essential hypertension. Blood pressure is reasonably well controlled.  BP Readings from Last 3 Encounters:  08/16/19 138/73  07/31/19 (!) 145/76  07/12/19 123/68   Lab Results  Component Value Date   CREATININE 0.95 05/15/2018   CREATININE 0.91 09/28/2017   CREATININE 0.98 03/09/2016   Type 2 diabetes mellitus with obesity ([place]). Max [name] is taking [name] and Novolin R.   Lab Results  Component Value Date   HGBA1C 7.6 (H) 12/11/2018   HGBA1C 7.7 (H) 05/15/2018   HGBA1C 8.3 (H) 09/28/2017   Lab Results  Component Value Date   MICROALBUR 0.20 01/09/2008   LDLCALC 111 (H) 12/11/2018   CREATININE 0.95 05/15/2018   No results found for: INSULIN  Nausea. [name] states he has intermittent nausea secondary to [name] and pain medication.  Assessment/Plan:   Essential hypertension. Max [name] is working on healthy weight loss and exercise to improve blood pressure control. We will watch for signs of hypotension as he continues his lifestyle modifications. He will continue his medication as directed.  Type 2 diabetes mellitus with obesity ([place]). Good blood sugar control is important to decrease the likelihood of diabetic complications such as nephropathy, neuropathy, limb loss, blindness, coronary artery disease, and death. Intensive lifestyle modification  including diet, exercise and weight loss are the first line of treatment for diabetes. Max [name] will continue his medications as directed. He will decrease eating at night.  Nausea. Prescription was given for ondansetron (ZOFRAN) 4 MG tablet every 8 hours #30 with 1 refill.  Class 2 severe obesity with serious comorbidity and body mass index (BMI) of 35.0 to 35.9 in adult, unspecified obesity type ([place]).  [name] is currently in the action stage of change. As such, his goal is to continue with weight loss efforts. He has agreed to the Category 3 Plan.   He will work on meal planning and intentional eating.  Exercise goals: Older adults should follow the adult guidelines. When older adults cannot meet the adult guidelines, they should be as physically active as their abilities and conditions will allow.   Behavioral modification strategies: increasing lean protein intake, decreasing simple carbohydrates, increasing vegetables, increasing water intake, decreasing eating out, no skipping meals, meal planning and cooking strategies and keeping healthy foods in the home.  Max [name] has agreed to follow-up with our clinic in 2 weeks. He was informed of the importance of frequent follow-up visits to maximize his success with intensive lifestyle modifications for his multiple health conditions.   Objective:   Blood pressure 138/73, pulse 88, temperature 98 F (36.7 C), height 5\' 10"  (1.778 m), weight 249 lb (112.9 kg), SpO2 95 %. Body mass index is 35.73 kg/m.  General: Cooperative, alert, well developed, in no acute distress. HEENT: Conjunctivae and lids unremarkable. Cardiovascular: Regular rhythm.  Lungs: Normal work of breathing. Neurologic: No focal deficits.   Lab Results  Component Value Date   CREATININE 0.95 05/15/2018   BUN 14 05/15/2018   NA 139 05/15/2018   K 4.1 05/15/2018   CL 98 05/15/2018   CO2 26 05/15/2018   Lab Results  Component Value Date   ALT 17 05/15/2018   AST  16 05/15/2018   ALKPHOS 94 05/15/2018   BILITOT 0.2 05/15/2018   Lab Results  Component Value Date   HGBA1C 7.6 (H) 12/11/2018   HGBA1C 7.7 (H) 05/15/2018   HGBA1C 8.3 (H) 09/28/2017   HGBA1C 7.2 (H) 03/09/2016   HGBA1C 7.9 08/12/2015   No results found for: INSULIN Lab Results  Component Value Date   TSH 0.471 09/28/2017   Lab Results  Component Value Date   CHOL 176 12/11/2018   HDL 52 12/11/2018   LDLCALC 111 (H) 12/11/2018   TRIG 67 12/11/2018   CHOLHDL 3.8 Ratio 01/09/2008   Lab Results  Component Value Date   WBC 10.8 09/28/2017   HGB 13.8 09/28/2017   HCT 42.3 09/28/2017   MCV 80 09/28/2017   PLT 291 03/09/2016   No results found for: IRON, TIBC, FERRITIN  Obesity Behavioral Intervention Documentation for Insurance:   Approximately 15 minutes were spent on the discussion below.  ASK: We discussed the diagnosis of obesity with Max [name] today and Max [name] agreed to give [name] permission to discuss obesity behavioral modification therapy today.  ASSESS: [name] has the diagnosis of obesity and his BMI today is 35.8. [name] is in the action stage of change.   ADVISE: [name] was educated on the multiple health risks of obesity as well as the benefit of weight loss to improve his health. He was advised of the need for long term treatment and the importance of lifestyle modifications to improve his current health and to decrease his risk of future health problems.  AGREE: Multiple dietary modification options and treatment options were discussed and Max [name] agreed to follow the recommendations documented in the above note.  ARRANGE: Max [name] was educated on the importance of frequent visits to treat obesity as outlined per CMS and USPSTF guidelines and agreed to schedule his next follow up appointment today.  Attestation Statements:   Reviewed by clinician on day of visit: allergies, medications, problem list, medical history, surgical history, family history,  social history, and previous encounter notes.  Migdalia Dk, am acting as Location manager for [employer], DO   I have reviewed the above documentation for accuracy and completeness, and I agree with the above. [name], DO

## 2019-09-06 ENCOUNTER — Ambulatory Visit (INDEPENDENT_AMBULATORY_CARE_PROVIDER_SITE_OTHER): Payer: Medicare Other | Admitting: Family Medicine

## 2019-09-06 ENCOUNTER — Other Ambulatory Visit: Payer: Self-pay

## 2019-09-06 ENCOUNTER — Encounter (INDEPENDENT_AMBULATORY_CARE_PROVIDER_SITE_OTHER): Payer: Self-pay | Admitting: Family Medicine

## 2019-09-06 VITALS — BP 95/64 | HR 107 | Temp 98.3°F | Ht 70.0 in | Wt 249.0 lb

## 2019-09-06 DIAGNOSIS — Z6835 Body mass index (BMI) 35.0-35.9, adult: Secondary | ICD-10-CM

## 2019-09-06 DIAGNOSIS — R110 Nausea: Secondary | ICD-10-CM

## 2019-09-06 DIAGNOSIS — Z794 Long term (current) use of insulin: Secondary | ICD-10-CM

## 2019-09-06 DIAGNOSIS — E7849 Other hyperlipidemia: Secondary | ICD-10-CM | POA: Diagnosis not present

## 2019-09-06 DIAGNOSIS — E1165 Type 2 diabetes mellitus with hyperglycemia: Secondary | ICD-10-CM

## 2019-09-07 LAB — COMPREHENSIVE METABOLIC PANEL
ALT: 16 IU/L (ref 0–44)
AST: 15 IU/L (ref 0–40)
Albumin/Globulin Ratio: 1.4 (ref 1.2–2.2)
Albumin: 4.2 g/dL (ref 3.7–4.7)
Alkaline Phosphatase: 102 IU/L (ref 48–121)
BUN/Creatinine Ratio: 14 (ref 10–24)
BUN: 14 mg/dL (ref 8–27)
Bilirubin Total: 0.4 mg/dL (ref 0.0–1.2)
CO2: 28 mmol/L (ref 20–29)
Calcium: 9.1 mg/dL (ref 8.6–10.2)
Chloride: 97 mmol/L (ref 96–106)
Creatinine, Ser: 1.01 mg/dL (ref 0.76–1.27)
GFR calc Af Amer: 86 mL/min/{1.73_m2} (ref >59)
GFR calc non Af Amer: 74 mL/min/{1.73_m2} (ref >59)
Globulin, Total: 3.1 g/dL (ref 1.5–4.5)
Glucose: 215 mg/dL — ABNORMAL HIGH (ref 65–99)
Potassium: 3.7 mmol/L (ref 3.5–5.2)
Sodium: 140 mmol/L (ref 134–144)
Total Protein: 7.3 g/dL (ref 6.0–8.5)

## 2019-09-07 LAB — LIPID PANEL WITH LDL/HDL RATIO
Cholesterol, Total: 190 mg/dL (ref 100–199)
HDL: 55 mg/dL (ref >39)
LDL Chol Calc (NIH): 121 mg/dL — ABNORMAL HIGH (ref 0–99)
LDL/HDL Ratio: 2.2 ratio (ref 0.0–3.6)
Triglycerides: 74 mg/dL (ref 0–149)
VLDL Cholesterol Cal: 14 mg/dL (ref 5–40)

## 2019-09-07 LAB — C-PEPTIDE: C-Peptide: 3.7 ng/mL (ref 1.1–4.4)

## 2019-09-10 NOTE — Progress Notes (Signed)
Chief Complaint:   OBESITY Kagan is here to discuss his progress with his obesity treatment plan along with follow-up of his obesity related diagnoses. Lliam is on the Category 3 Plan and states he is following his eating plan approximately 70% of the time. Geovonni states he is exercising for 0 minutes 0 times per week.  Today's visit was #: 29 Starting weight: 273 lbs Starting date: 09/28/2017 Today's weight: 248 lbs Today's date: 09/06/2019 Total lbs lost to date: 25 lbs Total lbs lost since last in-office visit: 1 lb  Interim History: Heyden is still experiencing intermittent bouts of nausea (no improvement with decrease in GLP-1).  Out of every 10 episodes of nausea, he says he will have 1 episode of vomiting.  He has been taking Zofran.  He says he wen to see his endocrinologist last week and was prescribed Jardiance.  He says he is able to tolerate baked chicken, fish, or yogurt.  He is able to eat mango, but very rarely.  He is having 8 ounces of chicken with homemade soup at breakfast, 10 ounces of chicken with salad at lunch, and salad with chicken and a protein smoothie for dinner.  He will occasionally eat yogurt in the evening or midday for a snack.  Subjective:   1. Type 2 diabetes mellitus with hyperglycemia, with long-term current use of insulin (HCC) Fasting blood sugar 90-130s (highest blood sugar 335).  He is taking 20 units three times daily Novoling and has been prescribed Jardiance.  He has not received the Jardiance yet.  Lab Results  Component Value Date   HGBA1C 7.6 (H) 12/11/2018   HGBA1C 7.7 (H) 05/15/2018   HGBA1C 8.3 (H) 09/28/2017   Lab Results  Component Value Date   MICROALBUR 0.20 01/09/2008   LDLCALC 121 (H) 09/06/2019   CREATININE 1.01 09/06/2019   2. Other hyperlipidemia Apolonio has hyperlipidemia and has been trying to improve his cholesterol levels with intensive lifestyle modification including a low saturated fat diet, exercise and  weight loss. He denies any chest pain, claudication or myalgias. He is not on a statin.  Lab Results  Component Value Date   ALT 16 09/06/2019   AST 15 09/06/2019   ALKPHOS 102 09/06/2019   BILITOT 0.4 09/06/2019   Lab Results  Component Value Date   CHOL 190 09/06/2019   HDL 55 09/06/2019   LDLCALC 121 (H) 09/06/2019   TRIG 74 09/06/2019   CHOLHDL 3.8 Ratio 01/09/2008   3. Nausea, persistant Unknown cause.  Tried decreasing [name] with no improvement.  Minimal improvement with Zofran.  Assessment/Plan:   1. Type 2 diabetes mellitus with hyperglycemia, with long-term current use of insulin (HCC) Will follow-up on blood sugars at next appointment. - Comprehensive metabolic panel - C-peptide  2. Other hyperlipidemia Will check FLP today, as per below. - Lipid Panel With LDL/HDL Ratio  3. Nausea, persistant Referral has been placed to Gastroenterology.  4. Class 2 severe obesity with serious comorbidity and body mass index (BMI) of 35.0 to 35.9 in adult, unspecified obesity type [facility]) Lonnel is currently in the action stage of change. As such, his goal is to continue with weight loss efforts. He has agreed to the Category 3 Plan.   Exercise goals: No exercise has been prescribed at this time.  Behavioral modification strategies: increasing lean protein intake, meal planning and cooking strategies, keeping healthy foods in the home and planning for success.  Posey has agreed to follow-up with our clinic in  3 weeks. He was informed of the importance of frequent follow-up visits to maximize his success with intensive lifestyle modifications for his multiple health conditions.   Joevanni was informed we would discuss his lab results at his next visit unless there is a critical issue that needs to be addressed sooner. Maverick agreed to keep his next visit at the agreed upon time to discuss these results.  Objective:   Blood pressure 95/64, pulse (!) 107, temperature 98.3  F (36.8 C), temperature source Oral, height 5\' 10"  (1.778 m), weight 249 lb (112.9 kg), SpO2 93 %. Body mass index is 35.73 kg/m.  General: Cooperative, alert, well developed, in no acute distress. HEENT: Conjunctivae and lids unremarkable. Cardiovascular: Regular rhythm.  Lungs: Normal work of breathing. Neurologic: No focal deficits.   Lab Results  Component Value Date   CREATININE 1.01 09/06/2019   BUN 14 09/06/2019   NA 140 09/06/2019   K 3.7 09/06/2019   CL 97 09/06/2019   CO2 28 09/06/2019   Lab Results  Component Value Date   ALT 16 09/06/2019   AST 15 09/06/2019   ALKPHOS 102 09/06/2019   BILITOT 0.4 09/06/2019   Lab Results  Component Value Date   HGBA1C 7.6 (H) 12/11/2018   HGBA1C 7.7 (H) 05/15/2018   HGBA1C 8.3 (H) 09/28/2017   HGBA1C 7.2 (H) 03/09/2016   HGBA1C 7.9 08/12/2015   Lab Results  Component Value Date   TSH 0.471 09/28/2017   Lab Results  Component Value Date   CHOL 190 09/06/2019   HDL 55 09/06/2019   LDLCALC 121 (H) 09/06/2019   TRIG 74 09/06/2019   CHOLHDL 3.8 Ratio 01/09/2008   Lab Results  Component Value Date   WBC 10.8 09/28/2017   HGB 13.8 09/28/2017   HCT 42.3 09/28/2017   MCV 80 09/28/2017   PLT 291 03/09/2016   Obesity Behavioral Intervention Documentation for Insurance:   Approximately 15 minutes were spent on the discussion below.  ASK: We discussed the diagnosis of obesity with [name] today and Tahje agreed to give [name] permission to discuss obesity behavioral modification therapy today.  ASSESS: Faron has the diagnosis of obesity and his BMI today is 35.7. Pankaj is in the action stage of change.   ADVISE: Trejon was educated on the multiple health risks of obesity as well as the benefit of weight loss to improve his health. He was advised of the need for long term treatment and the importance of lifestyle modifications to improve his current health and to decrease his risk of future health  problems.  AGREE: Multiple dietary modification options and treatment options were discussed and Khyrin agreed to follow the recommendations documented in the above note.  ARRANGE: Riccardo was educated on the importance of frequent visits to treat obesity as outlined per CMS and USPSTF guidelines and agreed to schedule his next follow up appointment today.  Attestation Statements:   Reviewed by clinician on day of visit: allergies, medications, problem list, medical history, surgical history, family history, social history, and previous encounter notes.  I, Water quality scientist, CMA, am acting as transcriptionist for [name], MD.  I have reviewed the above documentation for accuracy and completeness, and I agree with the above. - Jinny Blossom, MD

## 2019-09-27 ENCOUNTER — Encounter (INDEPENDENT_AMBULATORY_CARE_PROVIDER_SITE_OTHER): Payer: Self-pay | Admitting: Family Medicine

## 2019-09-27 ENCOUNTER — Encounter: Payer: Self-pay | Admitting: Nurse Practitioner

## 2019-09-27 ENCOUNTER — Ambulatory Visit: Payer: Medicare Other | Admitting: Nurse Practitioner

## 2019-09-27 ENCOUNTER — Other Ambulatory Visit (INDEPENDENT_AMBULATORY_CARE_PROVIDER_SITE_OTHER): Payer: Medicare Other

## 2019-09-27 ENCOUNTER — Other Ambulatory Visit: Payer: Self-pay

## 2019-09-27 ENCOUNTER — Ambulatory Visit (INDEPENDENT_AMBULATORY_CARE_PROVIDER_SITE_OTHER): Payer: Medicare Other | Admitting: Family Medicine

## 2019-09-27 VITALS — BP 126/72 | HR 85 | Ht 71.0 in | Wt 250.0 lb

## 2019-09-27 VITALS — BP 112/76 | HR 113 | Temp 98.8°F | Ht 70.0 in | Wt 245.0 lb

## 2019-09-27 DIAGNOSIS — Z6835 Body mass index (BMI) 35.0-35.9, adult: Secondary | ICD-10-CM | POA: Diagnosis not present

## 2019-09-27 DIAGNOSIS — R112 Nausea with vomiting, unspecified: Secondary | ICD-10-CM | POA: Diagnosis not present

## 2019-09-27 DIAGNOSIS — I10 Essential (primary) hypertension: Secondary | ICD-10-CM | POA: Diagnosis not present

## 2019-09-27 DIAGNOSIS — D509 Iron deficiency anemia, unspecified: Secondary | ICD-10-CM

## 2019-09-27 DIAGNOSIS — E1165 Type 2 diabetes mellitus with hyperglycemia: Secondary | ICD-10-CM | POA: Diagnosis not present

## 2019-09-27 HISTORY — DX: Nausea with vomiting, unspecified: R11.2

## 2019-09-27 LAB — CBC WITH DIFFERENTIAL/PLATELET
Basophils Absolute: 0.1 10*3/uL (ref 0.0–0.1)
Basophils Relative: 0.4 % (ref 0.0–3.0)
Eosinophils Absolute: 0 10*3/uL (ref 0.0–0.7)
Eosinophils Relative: 0.4 % (ref 0.0–5.0)
HCT: 43.7 % (ref 39.0–52.0)
Hemoglobin: 14.3 g/dL (ref 13.0–17.0)
Lymphocytes Relative: 19.2 % (ref 12.0–46.0)
Lymphs Abs: 2.3 10*3/uL (ref 0.7–4.0)
MCHC: 32.8 g/dL (ref 30.0–36.0)
MCV: 79.4 fl (ref 78.0–100.0)
Monocytes Absolute: 0.8 10*3/uL (ref 0.1–1.0)
Monocytes Relative: 7 % (ref 3.0–12.0)
Neutro Abs: 8.8 10*3/uL — ABNORMAL HIGH (ref 1.4–7.7)
Neutrophils Relative %: 73 % (ref 43.0–77.0)
Platelets: 251 10*3/uL (ref 150.0–400.0)
RBC: 5.5 Mil/uL (ref 4.22–5.81)
RDW: 14.5 % (ref 11.5–15.5)
WBC: 12 10*3/uL — ABNORMAL HIGH (ref 4.0–10.5)

## 2019-09-27 LAB — LIPASE: Lipase: 40 U/L (ref 11.0–59.0)

## 2019-09-27 LAB — C-REACTIVE PROTEIN: CRP: 1.2 mg/dL (ref 0.5–20.0)

## 2019-09-27 LAB — IRON, TOTAL/TOTAL IRON BINDING CAP
%SAT: 21 % (calc) (ref 20–48)
Iron: 65 ug/dL (ref 50–180)
TIBC: 309 mcg/dL (calc) (ref 250–425)

## 2019-09-27 LAB — AMYLASE: Amylase: 95 U/L (ref 27–131)

## 2019-09-27 LAB — FERRITIN: Ferritin: 123.8 ng/mL (ref 22.0–322.0)

## 2019-09-27 MED ORDER — EMPAGLIFLOZIN 10 MG PO TABS: 10.0000 mg | ORAL_TABLET | Freq: Every day | ORAL | Status: DC

## 2019-09-27 MED ORDER — OMEPRAZOLE 40 MG PO CPDR
40.0000 mg | DELAYED_RELEASE_CAPSULE | Freq: Every day | ORAL | 1 refills | Status: DC
Start: 2019-09-27 — End: 2019-12-21

## 2019-09-27 NOTE — Progress Notes (Signed)
09/27/2019 [name] [id number] 01-Feb-1946   CHIEF COMPLAINT: Nausea and vomiting   HISTORY OF PRESENT ILLNESS:  Max [name] is a 74 year old male with a past medical history of arthritis, back pain, anxiety, depression, bipolar disorder, DM II, DVT, OSA uses cpap and iron deficiency. S/P Lumbar surgery 2018.  He presents today as referred by his primary care physician for further evaluation for nausea and vomiting.  He is accompanied by his wife.  He complains of having constant nausea which started 3 to 4 months ago.  He vomits once or twice weekly.  He further describes vomiting once for every 10 episodes of dry heaves.  His emesis consists of a beige fluid.  No hematemesis.  He denies having any heartburn, dysphagia or upper abdominal pain.  No lower abdominal pain.  He typically passes a normal formed brown bowel movement most days.  However, he passes a black solid stool once or twice weekly which she attributes to taking Ferrous Sulfate.  He reports having a history of iron deficiency which was identified when he attempted to donate blood 2 to 3 years ago.  He stated he is taking Ferrous Sulfate 325 mg once daily since that time.  He takes Miralax and drinks vegetable juice daily which keeps his bowels well regulated. He's never had an upper endoscopy.  No history of GERD or ulcers.  He reported having a colonoscopy less than 5 years ago was normal, no polyps and was possibly done by [facility].  I will request a copy of his colonoscopy records for further review. He takes ASA daily. No other NSAIDS. He has chronic lower back pain for which he takes chronic pain meds. He is taking Oxycodone 85m po tid with plans to reduce usage with the addition of Naltrexone.  He has intentionally lost 20 pounds by diet and exercise over the past year.  He is followed by [facility].  No no family history of esophageal, gastric or colorectal cancer.  No other  complaints today.   CMP Latest Ref Rng & Units 09/06/2019 05/15/2018 09/28/2017  Glucose 65 - 99 mg/dL 215(H) 173(H) 127(H)  BUN 8 - 27 mg/dL 14 14 15   Creatinine 0.76 - 1.27 mg/dL 1.01 0.95 0.91  Sodium 134 - 144 mmol/L 140 139 139  Potassium 3.5 - 5.2 mmol/L 3.7 4.1 4.0  Chloride 96 - 106 mmol/L 97 98 100  CO2 20 - 29 mmol/L 28 26 24   Calcium 8.6 - 10.2 mg/dL 9.1 9.2 9.4  Total Protein 6.0 - 8.5 g/dL 7.3 6.6 6.7  Total Bilirubin 0.0 - 1.2 mg/dL 0.4 0.2 0.4  Alkaline Phos 48 - 121 IU/L 102 94 106  AST 0 - 40 IU/L 15 16 22   ALT 0 - 44 IU/L 16 17 27    No recent CBC in epic  Past Medical History:  Diagnosis Date  . ADD (attention deficit disorder)   . Anxiety   . Back pain   . Bipolar 1 disorder ([place]   . Chronic pain   . Constipation   . Decreased hearing   . Depression   . Diabetes mellitus without complication ([place]    Type II  . Dry skin   . DVT [place] thrombosis) ([place]   leg (not sure which leg)  . Dyslipidemia   . Dyspnea    with exertion  . Ear drainage   . Excessive hunger   . Excessive thirst   .  Eye pain   . Fatigue   . Frequent urination   . Hard of hearing   . Hyperlipidemia   . Hypertension   . Itching   . Joint pain   . Lactose intolerance   . Leg cramps   . Mouth sores   . Nasal and sinus discharge   . Nausea   . Numbness and tingling    bilateral  . OSA (obstructive sleep apnea)    wears CPAP  . Shortness of breath   . Shortness of breath on exertion   . Sinus congestion   . Stiff neck   . Stress   . Swallowing difficulty   . Swelling of extremity   . Tinnitus   . Trouble in sleeping   . Urinary frequency   . Vision changes   . Weakness   . Wears glasses    Past Surgical History:  Procedure Laterality Date  . arm surgery Right   . BACK SURGERY  2004  . COLONOSCOPY    . ESOPHAGOGASTRODUODENOSCOPY    . EYE SURGERY Right    cataract  . SUBMANDIBULAR GLAND EXCISION Right 03/10/2016   Procedure: INTRAORAL  SUBMANDIBULAR   STONE REMOVAL;  Surgeon: Izora Gala, MD;  Location: [facility];  Service: ENT;  Laterality: Right;    Social History: Married. Retired. He has 2 sons and one daughter. Nonsmoker. No alcohol use. No drug use.   Family History: Mother died 67 due to breast cancer, history of DM. Father died age 41 murdered. Brother with kidney disease on dialysis.   Allergies  Allergen Reactions  . Butane Anxiety and Other (See Comments)    Hallucinations REACTIONS ARE SIDE EFFECTS   . Gabapentin Other (See Comments)    Causes agression  . Hydromorphone Swelling    SWELLING REACTION UNSPECIFIED   . Tizanidine Swelling    SWELLING REACTION UNSPECIFIED   . Topamax [Topiramate] Other (See Comments)    Hallucinations   . Buprenorphine Other (See Comments)    UNSPECIFIED REACTION   . Pork-Derived Products     Pt. Refuses pork derived products  . Latex Rash    Localized rash      Outpatient Encounter Medications as of 09/27/2019  Medication Sig  . alfuzosin (UROXATRAL) 10 MG 24 hr tablet Take 10 mg by mouth daily with breakfast.  . AMBULATORY NON FORMULARY MEDICATION Medication Name: maltrexazone 1 mcg at bedtime  . amLODipine (NORVASC) 10 MG tablet Take 10 mg by mouth daily.   . Blood Glucose Monitoring Suppl (ONETOUCH VERIO) w/Device KIT 1 kit by Does not apply route daily at 12 noon. Continuous Glucose Monitoring Kit  . Cholecalciferol (D3-1000 PO) Take 1 capsule by mouth daily.  . DULoxetine (CYMBALTA) 60 MG capsule Take 60 mg by mouth 2 (two) times daily.  . empagliflozin (JARDIANCE) 10 MG TABS tablet Take 1 tablet (10 mg total) by mouth daily before breakfast.  . ferrous sulfate 325 (65 FE) MG EC tablet Take 325 mg by mouth daily with breakfast.  . Flaxseed Oil OIL 750 mg daily.  Marland Kitchen glucose blood (ACCU-CHEK AVIVA) test strip Use as instructed  . hydrOXYzine (VISTARIL) 25 MG capsule   . Lancets (ACCU-CHEK SAFE-T PRO) lancets Use as instructed  . losartan (COZAAR) 100 MG tablet Take 100 mg by mouth  daily.  . Magnesium 400 MG TABS Take 1 tablet by mouth daily.  . ondansetron (ZOFRAN) 4 MG tablet Take 1 tablet (4 mg total) by mouth every 8 (eight) hours as needed  for nausea or vomiting.  . Oxycodone HCl 10 MG TABS Take 10 mg by mouth 3 (three) times daily.   . Potassium 99 MG TABS Take 1 tablet by mouth daily.  . Probiotic Product (PROBIOTIC-10 PO) Take 1 capsule by mouth daily.  . tamsulosin (FLOMAX) 0.4 MG CAPS capsule Take 0.4 mg by mouth daily.   . [name] 1.5 MH/9.6QI SOPN   . vitamin B-12 (CYANOCOBALAMIN) 1000 MCG tablet Take 1,000 mcg by mouth daily.  Marland Kitchen albuterol (VENTOLIN HFA) 108 (90 Base) MCG/ACT inhaler Inhale 2 puffs into the lungs every 6 (six) hours as needed for wheezing or shortness of breath.  . insulin regular (NOVOLIN R RELION) 100 units/mL injection Inject 0.3 mLs (30 Units total) into the skin 3 (three) times daily before meals.  . [DISCONTINUED] chlorthalidone (HYGROTON) 25 MG tablet Take 1 tablet (25 mg total) by mouth daily.   No facility-administered encounter medications on file as of 09/27/2019.    REVIEW OF SYSTEMS:  Gen: Denies fever, sweats or chills. No weight loss.  CV: Denies chest pain, palpitations or edema. Resp: Denies cough, shortness of breath of hemoptysis.  GI: See HPI.  GU : Denies urinary burning, blood in urine, increased urinary frequency or incontinence. MS: Denies joint pain, muscles aches or weakness. Derm: Denies rash, itchiness, skin lesions or unhealing ulcers. Psych: + anxiety and depression. Heme: Denies bruising, bleeding. Neuro:  Denies headaches, dizziness or paresthesias. Endo:  + DM. No thyroid or adrenal function.   PHYSICAL EXAM: BP 126/72   Pulse 85   Ht 5' 11"  (1.803 m)   Wt 250 lb (113.4 kg)   BMI 34.87 kg/m  General: Well developed 74 year old male  in no acute distress. Head: Normocephalic and atraumatic. Eyes:  Sclerae non-icteric, conjunctive pink. Ears: Normal auditory acuity. Mouth: Upper and lower  dentures. No ulcers or lesions.  Neck: Supple, no lymphadenopathy or thyromegaly.  Lungs: Clear bilaterally to auscultation without wheezes, crackles or rhonchi. Heart: Regular rate and rhythm. No murmur, rub or gallop appreciated.  Abdomen: Soft, nontender, non distended. No masses. No hepatosplenomegaly. Normoactive bowel sounds x 4 quadrants.  Rectal: Deferred.  Musculoskeletal: Symmetrical with no gross deformities. Skin: Warm and dry. No rash or lesions on visible extremities. Extremities: No edema. Neurological: Alert oriented x 4, no focal deficits.  Psychological:  Alert and cooperative. Normal mood and affect.  ASSESSMENT AND PLAN:  25.  74 year old male with constant nausea and intermittent vomiting.  Rule out gallstones,  peptic ulcer disease, upper GI malignancy and gastroparesis secondary to DM and chronic narcotic use. -Abdominal sonogram -EGD benefits and risks discussed including risk with sedation, risk of bleeding, perforation and infection  -If abdominal sonogram and EGD negative I would recommend an abdominal/pelvic CT to rule out other intra abdominal pathology specifically to assess the pancreas -Omeprazole 40 mg once daily -Ondansetron 4 mg p.o. every 8 hours as needed -Continue to decrease Oxycodone use   2. Colon cancer screening -Request copy of colonoscopy done possibly by [facility] less than 5 years ago -Colonoscopy recall date to be verified once his past colonoscopy records have been reviewed  3.  History of iron deficiency -CBC, iron, iron saturation, TIBC and ferritin level -EGD as ordered above  4.  Diabetes mellitus type 2. Glu 215 on 08/28/2019. HgA1C 7.6 on 9/14/202 -Follow up with endocrinology, patient has appt today.   5.  Chronic back pain on Oxycodone with plans to start Naltrexone  CC:  Eber Jones, *

## 2019-09-27 NOTE — Progress Notes (Signed)
Reviewed and agree with documentation and assessment and plan. K. Veena Reeve Mallo , MD   

## 2019-09-27 NOTE — Patient Instructions (Signed)
If you are age 72 or older, your body mass index should be between 23-30. Your Body mass index is 34.87 kg/m. If this is out of the aforementioned range listed, please consider follow up with your Primary Care Provider.  If you are age 52 or younger, your body mass index should be between 19-25. Your Body mass index is 34.87 kg/m. If this is out of the aformentioned range listed, please consider follow up with your Primary Care Provider.   Your provider has requested that you go to the basement level for lab work before leaving today. Press "B" on the elevator. The lab is located at the first door on the left as you exit the elevator.  We have sent the following medications to your pharmacy for you to pick up at your convenience: Omeprazole  You have been scheduled for an abdominal ultrasound at [facility] (1st floor of hospital) on 10/04/2019 at 9:30am. Please arrive 15 minutes prior to your appointment for registration. Make certain not to have anything to eat or drink 8 hours prior to your appointment. Should you need to reschedule your appointment, please contact radiology at [phone]. This test typically takes about 30 minutes to perform.  Due to recent changes in healthcare laws, you may see the results of your imaging and laboratory studies on MyChart before your provider has had a chance to review them.  We understand that in some cases there may be results that are confusing or concerning to you. Not all laboratory results come back in the same time frame and the provider may be waiting for multiple results in order to interpret others.  Please give [name] 48 hours in order for your provider to thoroughly review all the results before contacting the office for clarification of your results.

## 2019-10-03 NOTE — Progress Notes (Signed)
Chief Complaint:   OBESITY Max [name] is here to discuss his progress with his obesity treatment plan along with follow-up of his obesity related diagnoses. [name] is on the Category 3 Plan and states he is following his eating plan approximately 100% of the time. Cosby states he is walking 1 mile 2-3 times per week.  Today's visit was #: 41 Starting weight: 273 lbs Starting date: 09/28/2017 Today's weight: 244 lbs Today's date: 09/27/2019 Total lbs lost to date: 29 Total lbs lost since last in-office visit: 5  Interim History: Max [name] has had a terrible last few weeks secondary to significant increase in back pain. He has been decreasing oxycodone and suppose to start a new medication. He has been following the meal plan as strictly as he can. He is getting protein in. He has remained nauseated.  Subjective:   1. Type 2 diabetes mellitus with hyperglycemia, without long-term current use of insulin (HCC) Max [name]'s BGs range between 98 and 130. He is taking [name] and Jardiance, and he stopped insulin yesterday.  2. Essential hypertension Max [name]'s blood pressure is well controlled. He denies chest pain, chest pressure, or headache. His blood pressure at home runs at 120-130/70's. He is taking losartan, chlorthalidone, and amlodipine.  Assessment/Plan:   1. Type 2 diabetes mellitus with hyperglycemia, without long-term current use of insulin (HCC) Good blood sugar control is important to decrease the likelihood of diabetic complications such as nephropathy, neuropathy, limb loss, blindness, coronary artery disease, and death. Intensive lifestyle modification including diet, exercise and weight loss are the first line of treatment for diabetes. Max [name] will continue his medications, and we will follow up at his next appointment.  2. Essential hypertension Max [name] is working on healthy weight loss and exercise to improve blood pressure control. We will watch for signs of hypotension  as he continues his lifestyle modifications. We will follow up on his blood pressure at his next appointment.  3. Class 2 severe obesity with serious comorbidity and body mass index (BMI) of 35.0 to 35.9 in adult, unspecified obesity type [facility]) Max [name] is currently in the action stage of change. As such, his goal is to continue with weight loss efforts. He has agreed to the Category 3 Plan.   Exercise goals: All adults should avoid inactivity. Some physical activity is better than none, and adults who participate in any amount of physical activity gain some health benefits.  Behavioral modification strategies: increasing lean protein intake, increasing vegetables, meal planning and cooking strategies, keeping healthy foods in the home and planning for success.  Max [name] has agreed to follow-up with our clinic in 3 weeks. He was informed of the importance of frequent follow-up visits to maximize his success with intensive lifestyle modifications for his multiple health conditions.   Objective:   Blood pressure 112/76, pulse (!) 113, temperature 98.8 F (37.1 C), temperature source Oral, height 5\' 10"  (1.778 m), weight 245 lb (111.1 kg), SpO2 98 %. Body mass index is 35.15 kg/m.  General: Cooperative, alert, well developed, in no acute distress. HEENT: Conjunctivae and lids unremarkable. Cardiovascular: Regular rhythm.  Lungs: Normal work of breathing. Neurologic: No focal deficits.   Lab Results  Component Value Date   CREATININE 1.01 09/06/2019   BUN 14 09/06/2019   NA 140 09/06/2019   K 3.7 09/06/2019   CL 97 09/06/2019   CO2 28 09/06/2019   Lab Results  Component Value Date   ALT 16 09/06/2019   AST 15 09/06/2019   ALKPHOS 102 09/06/2019  BILITOT 0.4 09/06/2019   Lab Results  Component Value Date   HGBA1C 7.6 (H) 12/11/2018   HGBA1C 7.7 (H) 05/15/2018   HGBA1C 8.3 (H) 09/28/2017   HGBA1C 7.2 (H) 03/09/2016   HGBA1C 7.9 08/12/2015   No results found for: INSULIN Lab  Results  Component Value Date   TSH 0.471 09/28/2017   Lab Results  Component Value Date   CHOL 190 09/06/2019   HDL 55 09/06/2019   LDLCALC 121 (H) 09/06/2019   TRIG 74 09/06/2019   CHOLHDL 3.8 Ratio 01/09/2008   Lab Results  Component Value Date   WBC 12.0 (H) 09/27/2019   HGB 14.3 09/27/2019   HCT 43.7 09/27/2019   MCV 79.4 09/27/2019   PLT 251.0 09/27/2019   Lab Results  Component Value Date   IRON 65 09/27/2019   TIBC 309 09/27/2019   FERRITIN 123.8 09/27/2019   Attestation Statements:   Reviewed by clinician on day of visit: allergies, medications, problem list, medical history, surgical history, family history, social history, and previous encounter notes.  Time spent on visit including pre-visit chart review and post-visit care and charting was 15 minutes.    I, Trixie Dredge, am acting as transcriptionist for [name], MD.  I have reviewed the above documentation for accuracy and completeness, and I agree with the above. - Jinny Blossom, MD

## 2019-10-04 ENCOUNTER — Ambulatory Visit (HOSPITAL_COMMUNITY)
Admission: RE | Admit: 2019-10-04 | Discharge: 2019-10-04 | Disposition: A | Discharge status: Home / Self Care | Payer: Medicare Other | Source: Ambulatory Visit | Attending: Nurse Practitioner | Admitting: Nurse Practitioner

## 2019-10-04 ENCOUNTER — Other Ambulatory Visit: Payer: Self-pay

## 2019-10-04 DIAGNOSIS — D509 Iron deficiency anemia, unspecified: Secondary | ICD-10-CM

## 2019-10-04 DIAGNOSIS — R112 Nausea with vomiting, unspecified: Secondary | ICD-10-CM | POA: Insufficient documentation

## 2019-10-09 ENCOUNTER — Encounter: Payer: Self-pay | Admitting: Gastroenterology

## 2019-10-11 ENCOUNTER — Ambulatory Visit (INDEPENDENT_AMBULATORY_CARE_PROVIDER_SITE_OTHER): Payer: Medicare Other | Admitting: Family Medicine

## 2019-10-11 ENCOUNTER — Other Ambulatory Visit: Payer: Self-pay

## 2019-10-11 ENCOUNTER — Encounter (INDEPENDENT_AMBULATORY_CARE_PROVIDER_SITE_OTHER): Payer: Self-pay | Admitting: Family Medicine

## 2019-10-11 VITALS — BP 125/71 | HR 79 | Temp 98.3°F | Ht 70.0 in | Wt 240.0 lb

## 2019-10-11 DIAGNOSIS — E1159 Type 2 diabetes mellitus with other circulatory complications: Secondary | ICD-10-CM | POA: Diagnosis not present

## 2019-10-11 DIAGNOSIS — E669 Obesity, unspecified: Secondary | ICD-10-CM | POA: Diagnosis not present

## 2019-10-11 DIAGNOSIS — M545 Low back pain, unspecified: Secondary | ICD-10-CM

## 2019-10-11 DIAGNOSIS — E1165 Type 2 diabetes mellitus with hyperglycemia: Secondary | ICD-10-CM

## 2019-10-11 DIAGNOSIS — I10 Essential (primary) hypertension: Secondary | ICD-10-CM

## 2019-10-11 DIAGNOSIS — G8929 Other chronic pain: Secondary | ICD-10-CM

## 2019-10-11 DIAGNOSIS — I152 Hypertension secondary to endocrine disorders: Secondary | ICD-10-CM

## 2019-10-11 DIAGNOSIS — Z6834 Body mass index (BMI) 34.0-34.9, adult: Secondary | ICD-10-CM

## 2019-10-11 DIAGNOSIS — Z794 Long term (current) use of insulin: Secondary | ICD-10-CM

## 2019-10-16 ENCOUNTER — Ambulatory Visit (AMBULATORY_SURGERY_CENTER): Payer: Medicare Other | Admitting: Gastroenterology

## 2019-10-16 ENCOUNTER — Other Ambulatory Visit: Payer: Self-pay

## 2019-10-16 ENCOUNTER — Encounter: Payer: Self-pay | Admitting: Gastroenterology

## 2019-10-16 VITALS — BP 127/67 | HR 71 | Temp 97.8°F | Resp 13 | Ht 70.0 in | Wt 240.0 lb

## 2019-10-16 DIAGNOSIS — R1013 Epigastric pain: Secondary | ICD-10-CM | POA: Diagnosis not present

## 2019-10-16 DIAGNOSIS — R1310 Dysphagia, unspecified: Secondary | ICD-10-CM | POA: Diagnosis not present

## 2019-10-16 DIAGNOSIS — R112 Nausea with vomiting, unspecified: Secondary | ICD-10-CM

## 2019-10-16 MED ORDER — SODIUM CHLORIDE 0.9 % IV SOLN: 500.0000 mL | Freq: Once | INTRAVENOUS | Status: DC

## 2019-10-16 NOTE — Progress Notes (Signed)
To [facility], VSS. Report to Rn. Physician aware of additional meds. Stomach with solid contents, head of bed raised.tb

## 2019-10-16 NOTE — Progress Notes (Addendum)
No problems noted in the recovery room. Maw  Alphonsa Gin, RN set pt up for repeat EGD in 2 months.  She went over pre-visit info with pt and his wife today.  Maw

## 2019-10-16 NOTE — Op Note (Signed)
[facility] Patient Name: Max [name] Procedure Date: 10/16/2019 9:54 AM MRN: [id number] Endoscopist: Mauri Pole , MD Age: 74 Referring MD:  Date of Birth: May 13, 1945 Gender: Male Account #: [id number] Procedure:                Upper GI endoscopy Indications:              Epigastric abdominal pain, Abdominal pain in the                            right upper quadrant, Dyspepsia, Dysphagia, Nausea                            with vomiting, Persistent vomiting of unknown cause Medicines:                Monitored Anesthesia Care Procedure:                Pre-Anesthesia Assessment:                           - Prior to the procedure, a History and Physical                            was performed, and patient medications and                            allergies were reviewed. The patient's tolerance of                            previous anesthesia was also reviewed. The risks                            and benefits of the procedure and the sedation                            options and risks were discussed with the patient.                            All questions were answered, and informed consent                            was obtained. Prior Anticoagulants: The patient has                            taken no previous anticoagulant or antiplatelet                            agents. ASA Grade Assessment: III - A patient with                            severe systemic disease. After reviewing the risks                            and benefits, the patient was deemed in  satisfactory condition to undergo the procedure.                           After obtaining informed consent, the endoscope was                            passed under direct vision. Throughout the                            procedure, the patient's blood pressure, pulse, and                            oxygen saturations were monitored continuously. The                             Endoscope was introduced through the mouth, and                            advanced to the second part of duodenum. The upper                            GI endoscopy was technically difficult and complex                            due to presence of food and presence of bezoar. The                            patient tolerated the procedure well. Scope In: Scope Out: Findings:                 The Z-line was regular and was found 38 cm from the                            incisors.                           No gross lesions were noted in the entire esophagus.                           A large amount of a phytobezoar was found in the                            gastric fundus, in the gastric body and in the                            gastric antrum.                           The visualized part of stomach appeared normal,                            limited due to residual food and phytobezoar. No  evidence of pyloric stenosis or gastric outlet                            obstruction                           Food (residue) was found in the duodenal bulb.                           The second portion of the duodenum and third                            portion of the duodenum were normal. Complications:            No immediate complications. Estimated Blood Loss:     Estimated blood loss: none. Impression:               - Z-line regular, 38 cm from the incisors.                           - No gross lesions in esophagus.                           - A large amount of a phytobezoar in the stomach.                           - Retained food in the duodenum.                           - Normal second portion of the duodenum and third                            portion of the duodenum.                           - No specimens collected. Recommendation:           - Clear liquid diet today, then advance as                            tolerated to gastroparesis soft diet.                            - Follow an antireflux regimen.                           - Continue present medications.                           - Schedule gastric emtying scan 4 hours study                           - Return to GI office in 2 months.                           - Repeat EGD in 2-3 months                           -  Will need better glycemic control, avoid                            hypoglycemia or hyperglycemic events                           - Will consider referral for possible gastric                            stimulator placement Mauri Pole, MD 10/16/2019 10:27:02 AM This report has been signed electronically.

## 2019-10-16 NOTE — Patient Instructions (Addendum)
Handouts were given to you on GERD,  Clear liquid diet, then advance as tolerated to Gastroparesis soft diet and stay on this diet. You Blood Sugar was 101 in the recovery room. You may resume your current medications today. Please follow the antireflux regimen that is listed in the GERD handout. The office will call you to schedule a Gastric Emptying Scan 4 Hour Study and also a return office visit for 2 months. Repeat EGD in 2-3 months. Will need better glycemic control, avoid hypoglycemia or hyperglycemic events. Will consider referral for possible gastric stimulator placement. Please call if any questions or concerns.    YOU HAD AN ENDOSCOPIC PROCEDURE TODAY AT [facility]:   Refer to the procedure report that was given to you for any specific questions about what was found during the examination.  If the procedure report does not answer your questions, please call your gastroenterologist to clarify.  If you requested that your care partner not be given the details of your procedure findings, then the procedure report has been included in a sealed envelope for you to review at your convenience later.  YOU SHOULD EXPECT: Some feelings of bloating in the abdomen. Passage of more gas than usual.  Walking can help get rid of the air that was put into your GI tract during the procedure and reduce the bloating. If you had a lower endoscopy (such as a colonoscopy or flexible sigmoidoscopy) you may notice spotting of blood in your stool or on the toilet paper. If you underwent a bowel prep for your procedure, you may not have a normal bowel movement for a few days.  Please Note:  You might notice some irritation and congestion in your nose or some drainage.  This is from the oxygen used during your procedure.  There is no need for concern and it should clear up in a day or so.  SYMPTOMS TO REPORT IMMEDIATELY:     Following upper endoscopy (EGD)  Vomiting of blood or coffee ground  material  New chest pain or pain under the shoulder blades  Painful or persistently difficult swallowing  New shortness of breath  Fever of 100F or higher  Black, tarry-looking stools  For urgent or emergent issues, a gastroenterologist can be reached at any hour by calling [phone]. Do not use MyChart messaging for urgent concerns.    DIET:  We do recommend a small meal at first, but then you may proceed to your regular diet.  Drink plenty of fluids but you should avoid alcoholic beverages for 24 hours.  ACTIVITY:  You should plan to take it easy for the rest of today and you should NOT DRIVE or use heavy machinery until tomorrow (because of the sedation medicines used during the test).    FOLLOW UP: Our staff will call the number listed on your records 48-72 hours following your procedure to check on you and address any questions or concerns that you may have regarding the information given to you following your procedure. If we do not reach you, we will leave a message.  We will attempt to reach you two times.  During this call, we will ask if you have developed any symptoms of COVID 19. If you develop any symptoms (ie: fever, flu-like symptoms, shortness of breath, cough etc.) before then, please call [phone].  If you test positive for Covid 19 in the 2 weeks post procedure, please call and report this information to [name].    If  any biopsies were taken you will be contacted by phone or by letter within the next 1-3 weeks.  Please call us at [phone] if you have not heard about the biopsies in 3 weeks.    SIGNATURES/CONFIDENTIALITY: You and/or your care partner have signed paperwork which will be entered into your electronic medical record.  These signatures attest to the fact that that the information above on your After Visit Summary has been reviewed and is understood.  Full responsibility of the confidentiality of this discharge information lies with you and/or your  care-partner.

## 2019-10-16 NOTE — Progress Notes (Signed)
Chief Complaint:   OBESITY Max [name] is here to discuss his progress with his obesity treatment plan along with follow-up of his obesity related diagnoses. Max [name] is on the Category 3 Plan and states he is following his eating plan approximately 50-60% of the time. Max [name] states he is walking for 15-25 minutes 3-4 times per week.  Today's visit was #: 38 Starting weight: 273 lbs Starting date: 09/28/2017 Today's weight: 240 lbs Today's date: 10/11/2019 Total lbs lost to date: 33 Total lbs lost since last in-office visit: 5  Interim History: Max [name] is transitioning off Oxycodone to naltrexone to help with withdrawal symptoms. He has been in a significant amount of pain but this has improved since his last appointment. He is eating about the same as previously. His energy is down so he is drinking a caffeinated beverage. He and his wife just had their 52nd wedding anniversary.  Subjective:   1. Hypertension associated with diabetes (Max [name]) Max [name]'s blood pressure is well controlled today. He denies chest pain, chest pressure, or headache. He is on amlodipine and Cozaar.  2. Type 2 diabetes mellitus with hyperglycemia, with long-term current use of insulin (HCC) Max [name]'s BGs range between 58 and 258. Overall he is taking 20-30 units daily. He is not checking his BGs prior to meals. He started [name] and he saw Dr. Buddy Duty recently.  3. Chronic low back pain, unspecified back pain laterality, unspecified whether sciatica present Max [name] is on naltrexone compounded prescription but on review of 09/18/2019 notes questions of dosage.  Assessment/Plan:   1. Hypertension associated with diabetes ([place]) Max [name] is working on healthy weight loss and exercise to improve blood pressure control. We will watch for signs of hypotension as he continues his lifestyle modifications. We will follow up on his blood pressure at his next appointment.  2. Type 2 diabetes mellitus with hyperglycemia, with  long-term current use of insulin (HCC) Good blood sugar control is important to decrease the likelihood of diabetic complications such as nephropathy, neuropathy, limb loss, blindness, coronary artery disease, and death. Intensive lifestyle modification including diet, exercise and weight loss are the first line of treatment for diabetes. Max [name] will continue his current medications, no change in dose except in insulin.  3. Chronic low back pain, unspecified back pain laterality, unspecified whether sciatica present Max [name] is to call the pain clinic to verify his medications dosage.   4. Class 1 obesity with serious comorbidity and body mass index (BMI) of 34.0 to 34.9 in adult, unspecified obesity type Max [name] is currently in the action stage of change. As such, his goal is to continue with weight loss efforts. He has agreed to the Category 3 Plan.   Exercise goals: All adults should avoid inactivity. Some physical activity is better than none, and adults who participate in any amount of physical activity gain some health benefits.  Behavioral modification strategies: increasing lean protein intake.  Max [name] has agreed to follow-up with our clinic in 3 weeks. He was informed of the importance of frequent follow-up visits to maximize his success with intensive lifestyle modifications for his multiple health conditions.   Objective:   Blood pressure 125/71, pulse 79, temperature 98.3 F (36.8 C), temperature source Oral, height 5\' 10"  (1.778 m), weight 240 lb (108.9 kg), SpO2 95 %. Body mass index is 34.44 kg/m.  General: Cooperative, alert, well developed, in no acute distress. HEENT: Conjunctivae and lids unremarkable. Cardiovascular: Regular rhythm.  Lungs: Normal work of breathing. Neurologic: No focal deficits.   Lab  Results  Component Value Date   CREATININE 1.01 09/06/2019   BUN 14 09/06/2019   NA 140 09/06/2019   K 3.7 09/06/2019   CL 97 09/06/2019   CO2 28 09/06/2019    Lab Results  Component Value Date   ALT 16 09/06/2019   AST 15 09/06/2019   ALKPHOS 102 09/06/2019   BILITOT 0.4 09/06/2019   Lab Results  Component Value Date   HGBA1C 7.6 (H) 12/11/2018   HGBA1C 7.7 (H) 05/15/2018   HGBA1C 8.3 (H) 09/28/2017   HGBA1C 7.2 (H) 03/09/2016   HGBA1C 7.9 08/12/2015   No results found for: INSULIN Lab Results  Component Value Date   TSH 0.471 09/28/2017   Lab Results  Component Value Date   CHOL 190 09/06/2019   HDL 55 09/06/2019   LDLCALC 121 (H) 09/06/2019   TRIG 74 09/06/2019   CHOLHDL 3.8 Ratio 01/09/2008   Lab Results  Component Value Date   WBC 12.0 (H) 09/27/2019   HGB 14.3 09/27/2019   HCT 43.7 09/27/2019   MCV 79.4 09/27/2019   PLT 251.0 09/27/2019   Lab Results  Component Value Date   IRON 65 09/27/2019   TIBC 309 09/27/2019   FERRITIN 123.8 09/27/2019   Attestation Statements:   Reviewed by clinician on day of visit: allergies, medications, problem list, medical history, surgical history, family history, social history, and previous encounter notes.  Time spent on visit including pre-visit chart review and post-visit care and charting was 17 minutes.    I, Trixie Dredge, am acting as transcriptionist for [name], MD.  I have reviewed the above documentation for accuracy and completeness, and I agree with the above. - Jinny Blossom, MD

## 2019-10-18 ENCOUNTER — Telehealth: Payer: Self-pay

## 2019-10-18 NOTE — Telephone Encounter (Signed)
  Follow up Call-  Call back number 10/16/2019  Post procedure Call Back phone  # [phone] wife # may talk to her  Permission to leave phone message Yes  Some recent data might be hidden     Patient questions:  Do you have a fever, pain , or abdominal swelling? No. Pain Score  0 *  Have you tolerated food without any problems? Yes.    Have you been able to return to your normal activities? Yes.    Do you have any questions about your discharge instructions: Diet   No. Medications  No. Follow up visit  No.  Do you have questions or concerns about your Care? No.  Actions: * If pain score is 4 or above: No action needed, pain <4. 1. Have you developed a fever since your procedure? no  2.   Have you had an respiratory symptoms (SOB or cough) since your procedure? no  3.   Have you tested positive for COVID 19 since your procedure no  4.   Have you had any family members/close contacts diagnosed with the COVID 19 since your procedure?  no   If yes to any of these questions please route to Joylene John, RN and Erenest Rasher, RN

## 2019-10-18 NOTE — Telephone Encounter (Signed)
1st follow up call made.  NAULM 

## 2019-10-18 NOTE — Telephone Encounter (Signed)
Pt's wife called and stated pt is feeling fine.

## 2019-10-23 ENCOUNTER — Other Ambulatory Visit: Payer: Self-pay

## 2019-10-23 ENCOUNTER — Telehealth: Payer: Self-pay

## 2019-10-23 DIAGNOSIS — R112 Nausea with vomiting, unspecified: Secondary | ICD-10-CM

## 2019-10-23 NOTE — Telephone Encounter (Signed)
Gastric emptying scan needed per EGD procedure note of 10/16/19. GES on 11/06/19 at 11:00 am [facility]. NPO 8 hours prior. No stomach medication 8 hours prior. Arrive at 10:30 am. Patient's spouse notified. Patient requires some assistance from spouse due to his memory impairment. Radiology notified.

## 2019-11-01 ENCOUNTER — Encounter (INDEPENDENT_AMBULATORY_CARE_PROVIDER_SITE_OTHER): Payer: Self-pay | Admitting: Family Medicine

## 2019-11-01 ENCOUNTER — Other Ambulatory Visit: Payer: Self-pay

## 2019-11-01 ENCOUNTER — Ambulatory Visit (INDEPENDENT_AMBULATORY_CARE_PROVIDER_SITE_OTHER): Payer: Medicare Other | Admitting: Family Medicine

## 2019-11-01 VITALS — BP 111/65 | HR 99 | Temp 98.2°F | Ht 70.0 in | Wt 242.0 lb

## 2019-11-01 DIAGNOSIS — E1159 Type 2 diabetes mellitus with other circulatory complications: Secondary | ICD-10-CM | POA: Diagnosis not present

## 2019-11-01 DIAGNOSIS — E1165 Type 2 diabetes mellitus with hyperglycemia: Secondary | ICD-10-CM | POA: Diagnosis not present

## 2019-11-01 DIAGNOSIS — I152 Hypertension secondary to endocrine disorders: Secondary | ICD-10-CM

## 2019-11-01 DIAGNOSIS — Z6834 Body mass index (BMI) 34.0-34.9, adult: Secondary | ICD-10-CM

## 2019-11-01 DIAGNOSIS — Z794 Long term (current) use of insulin: Secondary | ICD-10-CM | POA: Diagnosis not present

## 2019-11-01 DIAGNOSIS — E669 Obesity, unspecified: Secondary | ICD-10-CM | POA: Diagnosis not present

## 2019-11-01 DIAGNOSIS — I10 Essential (primary) hypertension: Secondary | ICD-10-CM

## 2019-11-05 NOTE — Progress Notes (Signed)
Chief Complaint:   OBESITY Max [name] is here to discuss his progress with his obesity treatment plan along with follow-up of his obesity related diagnoses. [name] is on the Category 3 Plan and states he is following his eating plan approximately 65% of the time. [name] states he is being more mobile.  Today's visit was #: 64 Starting weight: 273 lbs Starting date: 09/28/2017 Today's weight: 242 lbs Today's date: 11/01/2019 Total lbs lost to date: 31 Total lbs lost since last in-office visit: 0  Interim History: Max [name] is still experiencing fairly extensive pain, and he is still waiting for things to improve pain wise. He is trying to be less insulin dependent and so he has been very carbohydrate cautious. He is doing baked fish, tomato soup with spices and herbs, and he is doing 1/2 sherbert cup instead of frosty to help with reflux. He did have a visit with his kids which he ate everything in sight.  Subjective:   1. Type 2 diabetes mellitus with hyperglycemia, with long-term current use of insulin (HCC) Max [name]'s BGs range from 62 to 316, and he is still taking insulin up to 20 units if necessary in the morning and early afternoon. He is trying hard to become less insulin dependent. He is on Jardiance, [name], and Novolin.  2. Hypertension associated with diabetes ([place]) Max [name] is experiencing less dizziness than before. His blood pressure is well controlled today. He denies chest pain, chest pressure, or headaches. He is on amlodipine and losartan.  Assessment/Plan:   1. Type 2 diabetes mellitus with hyperglycemia, with long-term current use of insulin (HCC) Good blood sugar control is important to decrease the likelihood of diabetic complications such as nephropathy, neuropathy, limb loss, blindness, coronary artery disease, and death. Intensive lifestyle modification including diet, exercise and weight loss are the first line of treatment for diabetes. Max [name] is to continue  checking his BGs regularly and he will record the amount of insulin he is taking daily.  2. Hypertension associated with diabetes ([place]) Ovide is working on healthy weight loss and exercise to improve blood pressure control. We will watch for signs of hypotension as he continues his lifestyle modifications. Cephus will continue his current medications at this time, if his blood pressure is well controlled then we will decrease his medications.  3. Class 1 obesity with serious comorbidity and body mass index (BMI) of 34.0 to 34.9 in adult, unspecified obesity type Max [name] is currently in the action stage of change. As such, his goal is to continue with weight loss efforts. He has agreed to the Category 3 Plan.   Exercise goals: As is.  Behavioral modification strategies: increasing lean protein intake, increasing vegetables, meal planning and cooking strategies, keeping healthy foods in the home and planning for success.  Max [name] has agreed to follow-up with our clinic in 3 weeks. He was informed of the importance of frequent follow-up visits to maximize his success with intensive lifestyle modifications for his multiple health conditions.   Objective:   Blood pressure 111/65, pulse 99, temperature 98.2 F (36.8 C), temperature source Oral, height 5\' 10"  (1.778 m), weight 242 lb (109.8 kg), SpO2 96 %. Body mass index is 34.72 kg/m.  General: Cooperative, alert, well developed, in no acute distress. HEENT: Conjunctivae and lids unremarkable. Cardiovascular: Regular rhythm.  Lungs: Normal work of breathing. Neurologic: No focal deficits.   Lab Results  Component Value Date   CREATININE 1.01 09/06/2019   BUN 14 09/06/2019   NA 140 09/06/2019  K 3.7 09/06/2019   CL 97 09/06/2019   CO2 28 09/06/2019   Lab Results  Component Value Date   ALT 16 09/06/2019   AST 15 09/06/2019   ALKPHOS 102 09/06/2019   BILITOT 0.4 09/06/2019   Lab Results  Component Value Date   HGBA1C 7.6 (H)  12/11/2018   HGBA1C 7.7 (H) 05/15/2018   HGBA1C 8.3 (H) 09/28/2017   HGBA1C 7.2 (H) 03/09/2016   HGBA1C 7.9 08/12/2015   No results found for: INSULIN Lab Results  Component Value Date   TSH 0.471 09/28/2017   Lab Results  Component Value Date   CHOL 190 09/06/2019   HDL 55 09/06/2019   LDLCALC 121 (H) 09/06/2019   TRIG 74 09/06/2019   CHOLHDL 3.8 Ratio 01/09/2008   Lab Results  Component Value Date   WBC 12.0 (H) 09/27/2019   HGB 14.3 09/27/2019   HCT 43.7 09/27/2019   MCV 79.4 09/27/2019   PLT 251.0 09/27/2019   Lab Results  Component Value Date   IRON 65 09/27/2019   TIBC 309 09/27/2019   FERRITIN 123.8 09/27/2019   Attestation Statements:   Reviewed by clinician on day of visit: allergies, medications, problem list, medical history, surgical history, family history, social history, and previous encounter notes.  Time spent on visit including pre-visit chart review and post-visit care and charting was 20 minutes.    I, Trixie Dredge, am acting as transcriptionist for [name], MD.  I have reviewed the above documentation for accuracy and completeness, and I agree with the above. - Jinny Blossom, MD

## 2019-11-06 ENCOUNTER — Encounter (HOSPITAL_COMMUNITY)
Admission: RE | Admit: 2019-11-06 | Discharge: 2019-11-06 | Disposition: A | Discharge status: Home / Self Care | Payer: Medicare Other | Source: Ambulatory Visit | Attending: Gastroenterology | Admitting: Gastroenterology

## 2019-11-06 ENCOUNTER — Other Ambulatory Visit: Payer: Self-pay

## 2019-11-06 DIAGNOSIS — R112 Nausea with vomiting, unspecified: Secondary | ICD-10-CM

## 2019-11-06 MED ORDER — TECHNETIUM TC 99M SULFUR COLLOID
1.9000 | Freq: Once | INTRAVENOUS | Status: AC | PRN
  Administered 2019-11-06: 1.9 via INTRAVENOUS

## 2019-11-22 ENCOUNTER — Ambulatory Visit (INDEPENDENT_AMBULATORY_CARE_PROVIDER_SITE_OTHER): Payer: Medicare Other | Admitting: Family Medicine

## 2019-11-22 ENCOUNTER — Encounter (INDEPENDENT_AMBULATORY_CARE_PROVIDER_SITE_OTHER): Payer: Self-pay | Admitting: Family Medicine

## 2019-11-22 ENCOUNTER — Other Ambulatory Visit: Payer: Self-pay

## 2019-11-22 VITALS — BP 138/76 | HR 77 | Temp 98.1°F | Ht 70.0 in | Wt 242.0 lb

## 2019-11-22 DIAGNOSIS — Z6834 Body mass index (BMI) 34.0-34.9, adult: Secondary | ICD-10-CM

## 2019-11-22 DIAGNOSIS — Z794 Long term (current) use of insulin: Secondary | ICD-10-CM | POA: Diagnosis not present

## 2019-11-22 DIAGNOSIS — E669 Obesity, unspecified: Secondary | ICD-10-CM | POA: Diagnosis not present

## 2019-11-22 DIAGNOSIS — I10 Essential (primary) hypertension: Secondary | ICD-10-CM

## 2019-11-22 DIAGNOSIS — E1165 Type 2 diabetes mellitus with hyperglycemia: Secondary | ICD-10-CM | POA: Diagnosis not present

## 2019-11-22 DIAGNOSIS — I152 Hypertension secondary to endocrine disorders: Secondary | ICD-10-CM

## 2019-11-22 DIAGNOSIS — E1159 Type 2 diabetes mellitus with other circulatory complications: Secondary | ICD-10-CM | POA: Diagnosis not present

## 2019-11-22 NOTE — Progress Notes (Signed)
Chief Complaint:   OBESITY Max [name] is here to discuss his progress with his obesity treatment plan along with follow-up of his obesity related diagnoses. [name] is on the Category 3 Plan and states he is following his eating plan approximately 60-70% of the time. [name] states he is walking 1-2 hours 3-4 times per week.    Today's visit was #: 75 Starting weight: 273 lbs Starting date: 09/28/2017 Today's weight: 242 lbs Today's date: 11/22/2019 Total lbs lost to date: 31 Total lbs lost since last in-office visit: 0  Interim History: Max [name]'s birthday was yesterday and he and his wife went out to dinner, and they spent most of the day with the kids and grandkids. He is occasionally eating more yogurt than on the plan. He is eating small meals 3 times per day. Eating mostly backed fish. He is planning on some traveling in the next few weeks. He has been blending fish into tomato soup (eating more soup secondary to GERD).  Subjective:   1. Type 2 diabetes mellitus with hyperglycemia, with long-term current use of insulin (HCC) Max [name]'s BGs range between 100 and 200's, and the ones that were in the 200's were taken after eating. He is slowly decreasing insulin. He ran out of 70/30 and is using less of his regular insulin. He is averaging 10 units per day.   2. Hypertension associated with diabetes (Max [name]) Max [name]'s blood pressure is well controlled today. He denies chest pain, chest pressure, or headache. He is on losartan and amlodipine.  Assessment/Plan:   1. Type 2 diabetes mellitus with hyperglycemia, with long-term current use of insulin (HCC) Good blood sugar control is important to decrease the likelihood of diabetic complications such as nephropathy, neuropathy, limb loss, blindness, coronary artery disease, and death. Intensive lifestyle modification including diet, exercise and weight loss are the first line of treatment for diabetes. Max [name] will continue his current decrease  of insulin and will monitor his BGs with CBGs, and we will follow up on his BGs at his next appointment.  2. Hypertension associated with diabetes (Max [name]) [name] will continue his current medications, no change in dose. He will working on healthy weight loss and exercise to improve blood pressure control. We will watch for signs of hypotension as he continues his lifestyle modifications.   3. Class 1 obesity with serious comorbidity and body mass index (BMI) of 34.0 to 34.9 in adult, unspecified obesity type Max [name] is currently in the action stage of change. As such, his goal is to continue with weight loss efforts. He has agreed to the Category 3 Plan.   Exercise goals: All adults should avoid inactivity. Some physical activity is better than none, and adults who participate in any amount of physical activity gain some health benefits.  Behavioral modification strategies: increasing lean protein intake, meal planning and cooking strategies, keeping healthy foods in the home and planning for success.  Max [name] has agreed to follow-up with our clinic in 2 to 3 weeks. He was informed of the importance of frequent follow-up visits to maximize his success with intensive lifestyle modifications for his multiple health conditions.   Objective:   Blood pressure 138/76, pulse 77, temperature 98.1 F (36.7 C), temperature source Oral, height 5\' 10"  (1.778 m), weight 242 lb (109.8 kg), SpO2 98 %. Body mass index is 34.72 kg/m.  General: Cooperative, alert, well developed, in no acute distress. HEENT: Conjunctivae and lids unremarkable. Cardiovascular: Regular rhythm.  Lungs: Normal work of breathing. Neurologic: No focal deficits.  Lab Results  Component Value Date   CREATININE 1.01 09/06/2019   BUN 14 09/06/2019   NA 140 09/06/2019   K 3.7 09/06/2019   CL 97 09/06/2019   CO2 28 09/06/2019   Lab Results  Component Value Date   ALT 16 09/06/2019   AST 15 09/06/2019   ALKPHOS 102  09/06/2019   BILITOT 0.4 09/06/2019   Lab Results  Component Value Date   HGBA1C 7.6 (H) 12/11/2018   HGBA1C 7.7 (H) 05/15/2018   HGBA1C 8.3 (H) 09/28/2017   HGBA1C 7.2 (H) 03/09/2016   HGBA1C 7.9 08/12/2015   No results found for: INSULIN Lab Results  Component Value Date   TSH 0.471 09/28/2017   Lab Results  Component Value Date   CHOL 190 09/06/2019   HDL 55 09/06/2019   LDLCALC 121 (H) 09/06/2019   TRIG 74 09/06/2019   CHOLHDL 3.8 Ratio 01/09/2008   Lab Results  Component Value Date   WBC 12.0 (H) 09/27/2019   HGB 14.3 09/27/2019   HCT 43.7 09/27/2019   MCV 79.4 09/27/2019   PLT 251.0 09/27/2019   Lab Results  Component Value Date   IRON 65 09/27/2019   TIBC 309 09/27/2019   FERRITIN 123.8 09/27/2019   Attestation Statements:   Reviewed by clinician on day of visit: allergies, medications, problem list, medical history, surgical history, family history, social history, and previous encounter notes.  Time spent on visit including pre-visit chart review and post-visit care and charting was 16 minutes.    I, Trixie Dredge, am acting as transcriptionist for [name], MD.  I have reviewed the above documentation for accuracy and completeness, and I agree with the above. - Jinny Blossom, MD

## 2019-12-13 ENCOUNTER — Encounter (INDEPENDENT_AMBULATORY_CARE_PROVIDER_SITE_OTHER): Payer: Self-pay | Admitting: Family Medicine

## 2019-12-13 ENCOUNTER — Ambulatory Visit (INDEPENDENT_AMBULATORY_CARE_PROVIDER_SITE_OTHER): Payer: Medicare Other | Admitting: Family Medicine

## 2019-12-13 ENCOUNTER — Other Ambulatory Visit: Payer: Self-pay

## 2019-12-13 VITALS — BP 136/71 | HR 105 | Temp 98.7°F | Ht 70.0 in | Wt 235.0 lb

## 2019-12-13 DIAGNOSIS — E1165 Type 2 diabetes mellitus with hyperglycemia: Secondary | ICD-10-CM

## 2019-12-13 DIAGNOSIS — E669 Obesity, unspecified: Secondary | ICD-10-CM

## 2019-12-13 DIAGNOSIS — E1159 Type 2 diabetes mellitus with other circulatory complications: Secondary | ICD-10-CM | POA: Diagnosis not present

## 2019-12-13 DIAGNOSIS — Z6833 Body mass index (BMI) 33.0-33.9, adult: Secondary | ICD-10-CM

## 2019-12-13 DIAGNOSIS — I10 Essential (primary) hypertension: Secondary | ICD-10-CM | POA: Diagnosis not present

## 2019-12-13 DIAGNOSIS — I152 Hypertension secondary to endocrine disorders: Secondary | ICD-10-CM

## 2019-12-17 NOTE — Progress Notes (Signed)
Chief Complaint:   OBESITY Max [name] is here to discuss his progress with his obesity treatment plan along with follow-up of his obesity related diagnoses. Max [name] is on the Category 3 Plan and states he is following his eating plan approximately 70% of the time. Max [name] states he is waking for 60 minutes 3 times per week.  Today's visit was #: 98 Starting weight: 273 lbs Starting date: 09/28/2017 Today's weight: 235 lbs Today's date: 12/13/2019 Total lbs lost to date: 38 Total lbs lost since last in-office visit: 7  Interim History: Max [name] has restarted Jardiance and almost completely off insulin. He is eating a significant amount of yogurt. He reports he has been in less pain with the decrease in weight. His wife is making a protein vegetable juice for him to take in conjunction with his meal plan. He des report that with increase of yogurt he is seeing effects of probiotics.  Subjective:   1. Hypertension associated with diabetes ([place]) Minard's blood pressure is well controlled today. He denies chest pain, chest pressure, or headache. He is on amlodipine, losartan, and statin.  2. Type 2 diabetes mellitus with hyperglycemia, without long-term current use of insulin (HCC) Max [name] is barely taking any insulin, and he is taking [name]. He had a few hypoglycemic episodes. His BGs ranging from 51 to 276. Most blood sugars in 100's with a few in 200's.  Assessment/Plan:   1. Hypertension associated with diabetes ([place]) Marlee is working on healthy weight loss and exercise to improve blood pressure control. We will watch for signs of hypotension as he continues his lifestyle modifications. Conard will continue his current medications, no change in dosage.  2. Type 2 diabetes mellitus with hyperglycemia, without long-term current use of insulin (HCC) Good blood sugar control is important to decrease the likelihood of diabetic complications such as nephropathy, neuropathy, limb loss,  blindness, coronary artery disease, and death. Intensive lifestyle modification including diet, exercise and weight loss are the first line of treatment for diabetes. Max [name] will continue his current medications. He is to stop insulin completely and we will follow up on his blood sugar at his next appointment.  3. Class 1 obesity with serious comorbidity and body mass index (BMI) of 33.0 to 33.9 in adult, unspecified obesity type Max [name] is currently in the action stage of change. As such, his goal is to continue with weight loss efforts. He has agreed to the Category 3 Plan.   Exercise goals: All adults should avoid inactivity. Some physical activity is better than none, and adults who participate in any amount of physical activity gain some health benefits.  Behavioral modification strategies: increasing lean protein intake, meal planning and cooking strategies, keeping healthy foods in the home and planning for success.  Max [name] has agreed to follow-up with our clinic in 3 weeks. He was informed of the importance of frequent follow-up visits to maximize his success with intensive lifestyle modifications for his multiple health conditions.   Objective:   Blood pressure 136/71, pulse (!) 105, temperature 98.7 F (37.1 C), temperature source Oral, height 5\' 10"  (1.778 m), weight 235 lb (106.6 kg), SpO2 99 %. Body mass index is 33.72 kg/m.  General: Cooperative, alert, well developed, in no acute distress. HEENT: Conjunctivae and lids unremarkable. Cardiovascular: Regular rhythm.  Lungs: Normal work of breathing. Neurologic: No focal deficits.   Lab Results  Component Value Date   CREATININE 1.01 09/06/2019   BUN 14 09/06/2019   NA 140 09/06/2019   K  3.7 09/06/2019   CL 97 09/06/2019   CO2 28 09/06/2019   Lab Results  Component Value Date   ALT 16 09/06/2019   AST 15 09/06/2019   ALKPHOS 102 09/06/2019   BILITOT 0.4 09/06/2019   Lab Results  Component Value Date   HGBA1C 7.6  (H) 12/11/2018   HGBA1C 7.7 (H) 05/15/2018   HGBA1C 8.3 (H) 09/28/2017   HGBA1C 7.2 (H) 03/09/2016   HGBA1C 7.9 08/12/2015   No results found for: INSULIN Lab Results  Component Value Date   TSH 0.471 09/28/2017   Lab Results  Component Value Date   CHOL 190 09/06/2019   HDL 55 09/06/2019   LDLCALC 121 (H) 09/06/2019   TRIG 74 09/06/2019   CHOLHDL 3.8 Ratio 01/09/2008   Lab Results  Component Value Date   WBC 12.0 (H) 09/27/2019   HGB 14.3 09/27/2019   HCT 43.7 09/27/2019   MCV 79.4 09/27/2019   PLT 251.0 09/27/2019   Lab Results  Component Value Date   IRON 65 09/27/2019   TIBC 309 09/27/2019   FERRITIN 123.8 09/27/2019   Attestation Statements:   Reviewed by clinician on day of visit: allergies, medications, problem list, medical history, surgical history, family history, social history, and previous encounter notes.  Time spent on visit including pre-visit chart review and post-visit care and charting was 17 minutes.    I, Trixie Dredge, am acting as transcriptionist for [name], MD.  I have reviewed the above documentation for accuracy and completeness, and I agree with the above. - Jinny Blossom, MD

## 2019-12-21 ENCOUNTER — Encounter: Payer: Medicare Other | Admitting: Gastroenterology

## 2019-12-21 ENCOUNTER — Ambulatory Visit (AMBULATORY_SURGERY_CENTER): Payer: Medicare Other | Admitting: Gastroenterology

## 2019-12-21 ENCOUNTER — Encounter: Payer: Self-pay | Admitting: Gastroenterology

## 2019-12-21 ENCOUNTER — Other Ambulatory Visit: Payer: Self-pay

## 2019-12-21 VITALS — BP 137/84 | HR 69 | Temp 99.1°F | Resp 14 | Ht 70.0 in | Wt 235.0 lb

## 2019-12-21 DIAGNOSIS — K269 Duodenal ulcer, unspecified as acute or chronic, without hemorrhage or perforation: Secondary | ICD-10-CM

## 2019-12-21 DIAGNOSIS — R1310 Dysphagia, unspecified: Secondary | ICD-10-CM | POA: Diagnosis not present

## 2019-12-21 DIAGNOSIS — K449 Diaphragmatic hernia without obstruction or gangrene: Secondary | ICD-10-CM

## 2019-12-21 DIAGNOSIS — K2100 Gastro-esophageal reflux disease with esophagitis, without bleeding: Secondary | ICD-10-CM

## 2019-12-21 DIAGNOSIS — K2980 Duodenitis without bleeding: Secondary | ICD-10-CM

## 2019-12-21 DIAGNOSIS — R1319 Other dysphagia: Secondary | ICD-10-CM

## 2019-12-21 DIAGNOSIS — K254 Chronic or unspecified gastric ulcer with hemorrhage: Secondary | ICD-10-CM | POA: Diagnosis not present

## 2019-12-21 DIAGNOSIS — K222 Esophageal obstruction: Secondary | ICD-10-CM | POA: Diagnosis not present

## 2019-12-21 DIAGNOSIS — R112 Nausea with vomiting, unspecified: Secondary | ICD-10-CM

## 2019-12-21 MED ORDER — OMEPRAZOLE 40 MG PO CPDR: DELAYED_RELEASE_CAPSULE | ORAL | 3 refills | Status: DC

## 2019-12-21 MED ORDER — SODIUM CHLORIDE 0.9 % IV SOLN: 500.0000 mL | Freq: Once | INTRAVENOUS | Status: DC

## 2019-12-21 NOTE — Patient Instructions (Signed)
Follow dilatation diet today- handout given to you    Await biopsy results  Increase Omeprazole to 40 mg twice a day for 3 months ,then once daily  Make an appointment to see Dr Silverio Decamp in office next available appointment   Follow anti reflux instructions    YOU HAD AN ENDOSCOPIC PROCEDURE TODAY AT [facility]:   Refer to the procedure report that was given to you for any specific questions about what was found during the examination.  If the procedure report does not answer your questions, please call your gastroenterologist to clarify.  If you requested that your care partner not be given the details of your procedure findings, then the procedure report has been included in a sealed envelope for you to review at your convenience later.  YOU SHOULD EXPECT: Some feelings of bloating in the abdomen. Passage of more gas than usual.  Walking can help get rid of the air that was put into your GI tract during the procedure and reduce the bloating. If you had a lower endoscopy (such as a colonoscopy or flexible sigmoidoscopy) you may notice spotting of blood in your stool or on the toilet paper. If you underwent a bowel prep for your procedure, you may not have a normal bowel movement for a few days.  Please Note:  You might notice some irritation and congestion in your nose or some drainage.  This is from the oxygen used during your procedure.  There is no need for concern and it should clear up in a day or so.  SYMPTOMS TO REPORT IMMEDIATELY:     Following upper endoscopy (EGD)  Vomiting of blood or coffee ground material  New chest pain or pain under the shoulder blades  Painful or persistently difficult swallowing  New shortness of breath  Fever of 100F or higher  Black, tarry-looking stools  For urgent or emergent issues, a gastroenterologist can be reached at any hour by calling [phone]. Do not use MyChart messaging for urgent concerns.    DIET:  We  do recommend a small meal at first, but then you may proceed to your regular diet.  Drink plenty of fluids but you should avoid alcoholic beverages for 24 hours.  ACTIVITY:  You should plan to take it easy for the rest of today and you should NOT DRIVE or use heavy machinery until tomorrow (because of the sedation medicines used during the test).    FOLLOW UP: Our staff will call the number listed on your records 48-72 hours following your procedure to check on you and address any questions or concerns that you may have regarding the information given to you following your procedure. If we do not reach you, we will leave a message.  We will attempt to reach you two times.  During this call, we will ask if you have developed any symptoms of COVID 19. If you develop any symptoms (ie: fever, flu-like symptoms, shortness of breath, cough etc.) before then, please call [phone].  If you test positive for Covid 19 in the 2 weeks post procedure, please call and report this information to [name].    If any biopsies were taken you will be contacted by phone or by letter within the next 1-3 weeks.  Please call us at [phone] if you have not heard about the biopsies in 3 weeks.    SIGNATURES/CONFIDENTIALITY: You and/or your care partner have signed paperwork which will be entered into your electronic medical  record.  These signatures attest to the fact that that the information above on your After Visit Summary has been reviewed and is understood.  Full responsibility of the confidentiality of this discharge information lies with you and/or your care-partner. 

## 2019-12-21 NOTE — Op Note (Signed)
[place] Patient Name: Max [name] Procedure Date: 12/21/2019 9:23 AM MRN: [id number] Endoscopist: Mauri Pole , MD Age: 74 Referring MD:  Date of Birth: 1945-12-20 Gender: Male Account #: [id number] Procedure:                Upper GI endoscopy Indications:              Dysphagia Medicines:                Monitored Anesthesia Care Procedure:                Pre-Anesthesia Assessment:                           - Prior to the procedure, a History and Physical                            was performed, and patient medications and                            allergies were reviewed. The patient's tolerance of                            previous anesthesia was also reviewed. The risks                            and benefits of the procedure and the sedation                            options and risks were discussed with the patient.                            All questions were answered, and informed consent                            was obtained. Prior Anticoagulants: The patient has                            taken no previous anticoagulant or antiplatelet                            agents. ASA Grade Assessment: II - A patient with                            mild systemic disease. After reviewing the risks                            and benefits, the patient was deemed in                            satisfactory condition to undergo the procedure.                           After obtaining informed consent, the endoscope was  passed under direct vision. Throughout the                            procedure, the patient's blood pressure, pulse, and                            oxygen saturations were monitored continuously. The                            Endoscope was introduced through the mouth, and                            advanced to the second part of duodenum. The upper                            GI endoscopy was accomplished without  difficulty.                            The patient tolerated the procedure well. Scope In: Scope Out: Findings:                 LA Grade C (one or more mucosal breaks continuous                            between tops of 2 or more mucosal folds, less than                            75% circumference) esophagitis with no bleeding was                            found 34 to 38 cm from the incisors.                           One benign-appearing, intrinsic moderate                            (circumferential scarring or stenosis; an endoscope                            may pass) stenosis was found 37 to 38 cm from the                            incisors. This stenosis measured less than one cm                            (in length). The stenosis was traversed. A TTS                            dilator was passed through the scope. Dilation with                            an 18-19-20 mm balloon dilator was performed to 20  mm. The dilation site was examined following                            endoscope reinsertion and showed mild mucosal                            disruption.                           A small hiatal hernia was present.                           Many non-bleeding superficial gastric ulcers with                            pigmented material were found in the gastric antrum                            and in the prepyloric region of the stomach. The                            largest lesion was 5 mm in largest dimension.                            Biopsies were taken with a cold forceps for                            Helicobacter pylori testing.                           Few non-bleeding cratered and superficial duodenal                            ulcers with no stigmata of bleeding were found in                            the first portion of the duodenum and in the second                            portion of the duodenum. The largest lesion was 3                             mm in largest dimension. Biopsies were taken with a                            cold forceps for histology. Complications:            No immediate complications. Estimated Blood Loss:     Estimated blood loss was minimal. Impression:               - LA Grade C reflux esophagitis with no bleeding.                           - Benign-appearing esophageal stenosis. Dilated.                           -  Small hiatal hernia.                           - Non-bleeding gastric ulcers with pigmented                            material. Biopsied.                           - Non-bleeding duodenal ulcers with no stigmata of                            bleeding. Biopsied. Recommendation:           - Resume previous diet.                           - Continue present medications.                           - Await pathology results.                           - Return to GI office at the next available                            appointment.                           - Use Prilosec (omeprazole) 40 mg PO BID for 3                            months then once daily.                           - Use sucralfate tablets 1 gram PO BID for 1 month.                           - Follow an antireflux regimen. Mauri Pole, MD 12/21/2019 9:57:38 AM This report has been signed electronically.

## 2019-12-21 NOTE — Progress Notes (Signed)
Omeprazole ok to order but nor Carafate for now due to listed red dye allergy . Dr Silverio Decamp says pt has been on OMEPRAZOLE WITHOUT PROBLEMS

## 2019-12-21 NOTE — Progress Notes (Signed)
Report to PACU, RN, vss, BBS= Clear.  

## 2019-12-21 NOTE — Progress Notes (Signed)
sulcrafate not ordered per Dr Woodward Ku order due to pt's red dye allergy

## 2019-12-21 NOTE — Progress Notes (Signed)
Called to room to assist during endoscopic procedure.  Patient ID and intended procedure confirmed with present staff. Received instructions for my participation in the procedure from the performing physician.  

## 2019-12-25 ENCOUNTER — Telehealth: Payer: Self-pay | Admitting: *Deleted

## 2019-12-25 NOTE — Telephone Encounter (Signed)
1. Have you developed a fever since your procedure? no  2.   Have you had an respiratory symptoms (SOB or cough) since your procedure? no  3.   Have you tested positive for COVID 19 since your procedure no  4.   Have you had any family members/close contacts diagnosed with the COVID 19 since your procedure?  no   If yes to any of these questions please route to Joylene John, RN and Joella Prince, RN Follow up Call-  Call back number 12/21/2019 10/16/2019  Post procedure Call Back phone  # [phone] wife # may talk to her  Permission to leave phone message Yes Yes  Some recent data might be hidden     Patient questions:  Do you have a fever, pain , or abdominal swelling? No. Pain Score  0 *  Have you tolerated food without any problems? Yes.    Have you been able to return to your normal activities? Yes.    Do you have any questions about your discharge instructions: Diet   No. Medications  No. Follow up visit  No.  Do you have questions or concerns about your Care? No.  Actions: * If pain score is 4 or above: No action needed, pain <4.

## 2019-12-27 ENCOUNTER — Ambulatory Visit (INDEPENDENT_AMBULATORY_CARE_PROVIDER_SITE_OTHER): Payer: Medicare Other | Admitting: Internal Medicine

## 2019-12-27 ENCOUNTER — Encounter: Payer: Self-pay | Admitting: Internal Medicine

## 2019-12-27 ENCOUNTER — Other Ambulatory Visit: Payer: Self-pay

## 2019-12-27 VITALS — BP 138/84 | HR 101 | Temp 96.3°F | Ht 71.0 in | Wt 244.0 lb

## 2019-12-27 DIAGNOSIS — J4520 Mild intermittent asthma, uncomplicated: Secondary | ICD-10-CM

## 2019-12-27 DIAGNOSIS — G4733 Obstructive sleep apnea (adult) (pediatric): Secondary | ICD-10-CM

## 2019-12-27 NOTE — Assessment & Plan Note (Addendum)
Benefits from CPAP with good compliance and control Plan- continue auto 8-20. [facility] to service machine for abnormal noise.

## 2019-12-27 NOTE — Assessment & Plan Note (Signed)
Making progres with [facility]. Strongly encouraged to continue.

## 2019-12-27 NOTE — Patient Instructions (Addendum)
Order- DME- [facility]- please service machine for abnormal noise Continue auto 8-20, mask of choice, humidifier, supplies, AirView/ card  Please call if we can help  Ask your primary doctor to address the Shingles vaccine and the frequent bathroom trips

## 2019-12-27 NOTE — Progress Notes (Signed)
HPI  male never smoker followed for OSA, complicated by DM, hx DVT, HBP, morbid obesity -------------------  12/25/2018- 74 year old male never smoker followed for OSA, asthma, complicated by DM, hx DVT, HBP, morbid obesity CPAP auto 8-20/ [facility] Sent last year for mask desensitization and fitting. -----OSA on CPAP, DME: [facility], no complaints other than "whistling" noise from CPAP, pt spouse reports breakthrough apneas on machine Body weight today 272 lbs Download not available More tired last few weeks, not sure why. Prefers nasal pillows after trying others. Some positional leak. Pressures feel comfortable. Weight gain over past year. Chronic back pain-pain clinic.  Had flu vax. Discussed Covid and potential vaccine.   12/27/19- 74 year old male never smoker followed for OSA, Asthma, complicated by DM, hx DVT, HBP, morbid obesity CPAP auto 8-20/ [facility] Download compliance 93%, AHI 5.2/ hr                    Wife here Body weight today- 244 lbs Covid vax- 2 Phizer Flu vax-  Dieting off weight, working with [facility] [employer] [facility].  ------pt is here cpap compliance .pt states cpap machine is making noise Wife notes noise from CPAP, probably not mask leak.  Up to 6 bathroom trips at night for nocturia.    ROS-see HPI   + = positive Constitutional:   No-   weight loss, night sweats, fevers, chills, +fatigue, lassitude. HEENT:   No-  headaches, difficulty swallowing, tooth/dental problems, sore throat,       No-  sneezing, itching, ear ache, +nasal congestion, post nasal drip,  CV:  No-   chest pain, orthopnea, PND, swelling in lower extremities, anasarca, dizziness, palpitations Resp: No-   shortness of breath with exertion or at rest.              No-   productive cough,  No non-productive cough,  No- coughing up of blood.              No-   change in color of mucus.  + wheezing.   Skin: No-   rash or lesions. GI:  No-   heartburn, indigestion, abdominal pain, nausea, vomiting,   GU: . MS:  No-   joint pain or swelling.  + back pain. Neuro-     nothing unusual Psych:  No- change in mood or affect. No depression or anxiety.  No memory loss.  OBJ- Physical Exam General- Alert, Oriented, Affect-appropriate, Distress-mild due to back pain . Has lost some weight.   + Morbidly obese Skin- rash-none, lesions- none, excoriation- none Lymphadenopathy- none Head- atraumatic.             Eyes- Gross vision intact, PERRLA, conjunctivae and secretions clear            Ears- +Hearing aid/ HOH            Nose- Clear, no-Septal dev, mucus, polyps, erosion, perforation             Throat- Mallampati III , mucosa clear , drainage- none, tonsils- atrophic. +Dentures Neck- flexible , trachea midline, no stridor , thyroid nl, carotid no bruit Chest - symmetrical excursion , unlabored           Heart/CV- RRR , no murmur , no gallop  , no rub, nl s1 s2                           - JVD- none , edema- none, stasis changes-  none, varices- none           Lung- clear,  wheeze -none, cough- none , dullness-none, rub- none           Chest wall-  Abd-  Br/ Gen/ Rectal- Not done, not indicated Extrem- cyanosis- none, clubbing, none, atrophy- none, strength- nl Neuro-+ seems restless, shifting side to side

## 2019-12-28 ENCOUNTER — Encounter: Payer: Self-pay | Admitting: Gastroenterology

## 2019-12-28 ENCOUNTER — Ambulatory Visit (INDEPENDENT_AMBULATORY_CARE_PROVIDER_SITE_OTHER): Payer: Medicare Other | Admitting: Gastroenterology

## 2019-12-28 ENCOUNTER — Other Ambulatory Visit (INDEPENDENT_AMBULATORY_CARE_PROVIDER_SITE_OTHER): Payer: Medicare Other

## 2019-12-28 VITALS — BP 130/70 | HR 68 | Ht 71.0 in | Wt 241.4 lb

## 2019-12-28 DIAGNOSIS — K3184 Gastroparesis: Secondary | ICD-10-CM

## 2019-12-28 DIAGNOSIS — R1110 Vomiting, unspecified: Secondary | ICD-10-CM

## 2019-12-28 DIAGNOSIS — K760 Fatty (change of) liver, not elsewhere classified: Secondary | ICD-10-CM | POA: Diagnosis not present

## 2019-12-28 DIAGNOSIS — K449 Diaphragmatic hernia without obstruction or gangrene: Secondary | ICD-10-CM | POA: Diagnosis not present

## 2019-12-28 DIAGNOSIS — K2100 Gastro-esophageal reflux disease with esophagitis, without bleeding: Secondary | ICD-10-CM

## 2019-12-28 LAB — HEPATIC FUNCTION PANEL
ALT: 23 U/L (ref 0–53)
AST: 17 U/L (ref 0–37)
Albumin: 4.4 g/dL (ref 3.5–5.2)
Alkaline Phosphatase: 96 U/L (ref 39–117)
Bilirubin, Direct: 0.1 mg/dL (ref 0.0–0.3)
Total Bilirubin: 0.4 mg/dL (ref 0.2–1.2)
Total Protein: 7.5 g/dL (ref 6.0–8.3)

## 2019-12-28 MED ORDER — OMEPRAZOLE 40 MG PO CPDR: DELAYED_RELEASE_CAPSULE | ORAL | 3 refills | Status: DC

## 2019-12-28 NOTE — Patient Instructions (Addendum)
If you are age 72 or older, your body mass index should be between 23-30. Your Body mass index is 33.67 kg/m. If this is out of the aforementioned range listed, please consider follow up with your Primary Care Provider.  If you are age 68 or younger, your body mass index should be between 19-25. Your Body mass index is 33.67 kg/m. If this is out of the aformentioned range listed, please consider follow up with your Primary Care Provider.   Your provider has requested that you go to the basement level for lab work before leaving today. Press "B" on the elevator. The lab is located at the first door on the left as you exit the elevator.  Follow a low carb/ low fat diet, and eat small meals.  Refills of Omeprazole have been sent to your pharmacy.  Use Anti-Reflux measures.   Call the office and schedule a follow up in 6 months.

## 2019-12-28 NOTE — Progress Notes (Signed)
Max [name]    [id number]    09-16-1945  Primary Care Physician:Sun, Gari Crown, MD  Referring Physician: Donald Prose, MD [address] [place],  [place] [address]   Chief complaint: Dysphagia, GERD, nausea, abdominal discomfort  HPI:  74 year old very pleasant gentleman here for follow-up visit for gastroparesis, GERD and dysphagia  He is accompanied by his wife.  Overall his symptoms have significantly improved.  He is no longer vomiting.  Dysphagia has improved to some extent after EGD with esophageal dilation but he continues to feel food getting hung up sometimes but not as frequently as before.  He is having regular bowel movement daily, is taking 1 capful of MiraLAX along with 12 ounces of juice  He is off narcotics, is on Lyrica and naltrexone  He is continuing to suffer with low back pain, but is improving since he intentionally lost weight  Denies any melena or rectal bleeding.  EGD December 21, 2019: Erosive esophagitis, esophageal stricture s/p dilation with TTS balloon to 20 mm.  Hiatal hernia and erosive gastroduodenitis.  Duodenal biopsies showed peptic duodenitis.  Gastric biopsies were misinterpreted, possible typo or error in dictation by pathology.  Requested to review the slides again and awaiting the results  Gastric emptying scan 11/06/19 20% emptied at 1 hr ( normal >= 10%)  38% emptied at 2 hr ( normal >= 40%)  49% emptied at 3 hr ( normal >= 70%)  59% emptied at 4 hr ( normal >= 90%)  IMPRESSION: Delayed gastric emptying study.  Abdominal ultrasound October 04, 2019: Hepatic steatosis otherwise unremarkable exam  Colonoscopy was done by [facility] within the past 5 years, patient does not remember the exact year or the provider's name.  He was recommended to follow-up in 5 years.  Report is not available to review during this visit   Outpatient Encounter Medications as of 12/28/2019  Medication Sig  . albuterol  (VENTOLIN HFA) 108 (90 Base) MCG/ACT inhaler Inhale 2 puffs into the lungs every 6 (six) hours as needed for wheezing or shortness of breath.  . alfuzosin (UROXATRAL) 10 MG 24 hr tablet Take 10 mg by mouth daily with breakfast.  . AMBULATORY NON FORMULARY MEDICATION Medication Name: maltrexazone 1 mcg at bedtime  . amLODipine (NORVASC) 10 MG tablet Take 10 mg by mouth daily.   . Blood Glucose Monitoring Suppl (ONETOUCH VERIO) w/Device KIT 1 kit by Does not apply route daily at 12 noon. Continuous Glucose Monitoring Kit  . busPIRone (BUSPAR) 7.5 MG tablet Take 7.5 mg by mouth 2 (two) times daily.   . Cholecalciferol (D3-1000 PO) Take 1 capsule by mouth daily.  Marland Kitchen doxazosin (CARDURA) 4 MG tablet Take 4 mg by mouth daily.  . DULoxetine (CYMBALTA) 60 MG capsule Take 60 mg by mouth 2 (two) times daily.  Water engineer Bandages & Supports (LUMBAR BACK BRACE/SUPPORT PAD) MISC by Does not apply route.  . empagliflozin (JARDIANCE) 10 MG TABS tablet Take 1 tablet (10 mg total) by mouth daily before breakfast.  . ferrous sulfate 325 (65 FE) MG EC tablet Take 325 mg by mouth daily with breakfast.  . Flaxseed Oil OIL 750 mg daily.  Marland Kitchen glucose blood (ACCU-CHEK AVIVA) test strip Use as instructed  . hydrOXYzine (VISTARIL) 25 MG capsule   . Lancets (ACCU-CHEK SAFE-T PRO) lancets Use as instructed  . losartan (COZAAR) 100 MG tablet Take 100 mg by mouth daily.  . Magnesium 400 MG  TABS Take 1 tablet by mouth daily.  . naloxone (NARCAN) nasal spray 4 mg/0.1 mL Place into the nose.   Marland Kitchen omeprazole (PRILOSEC) 40 MG capsule Omeprazole 40 mg by mouth twice daily for three months ,then once daily  . ondansetron (ZOFRAN) 4 MG tablet Take 1 tablet (4 mg total) by mouth every 8 (eight) hours as needed for nausea or vomiting.  . polyethylene glycol powder (GLYCOLAX/MIRALAX) 17 GM/SCOOP powder Take by mouth.  . Potassium 99 MG TABS Take 1 tablet by mouth daily.  . pregabalin (LYRICA) 150 MG capsule Take 150 mg by mouth 2 (two)  times daily.  . Probiotic Product (PROBIOTIC-10 PO) Take 1 capsule by mouth daily.  . rosuvastatin (CRESTOR) 5 MG tablet Take by mouth.  . tamsulosin (FLOMAX) 0.4 MG CAPS capsule Take 0.4 mg by mouth daily.   Marland Kitchen tiZANidine (ZANAFLEX) 4 MG tablet SMARTSIG:1-3 Tablet(s) By Mouth Every 8 Hours PRN  . [name] 1.5 IN/8.6VE SOPN   . vitamin B-12 (CYANOCOBALAMIN) 1000 MCG tablet Take 1,000 mcg by mouth daily.  . [DISCONTINUED] omeprazole (PRILOSEC) 40 MG capsule Omeprazole 40 mg by mouth twice daily for three months ,then once daily  . [DISCONTINUED] cloNIDine (CATAPRES) 0.1 MG tablet Take by mouth. (Patient not taking: Reported on 10/16/2019)  . [DISCONTINUED] insulin regular (NOVOLIN R RELION) 100 units/mL injection Inject 0.3 mLs (30 Units total) into the skin 3 (three) times daily before meals.   No facility-administered encounter medications on file as of 12/28/2019.    Allergies as of 12/28/2019 - Review Complete 12/27/2019  Allergen Reaction Noted  . Baclofen Swelling 11/10/2016  . Butane Anxiety and Other (See Comments) 03/27/2012  . Other Itching and Other (See Comments) 04/13/2012  . Topiramate Other (See Comments) 03/27/2012  . Blue dyes (parenteral) Other (See Comments) 02/13/2016  . Gabapentin Other (See Comments) 09/10/2015  . Hydromorphone Swelling 09/10/2015  . Ketorolac Other (See Comments) 02/13/2016  . Poractant alfa Other (See Comments) 03/09/2016  . Red dye Other (See Comments) 02/13/2016  . Tizanidine Swelling 09/10/2015  . Buprenorphine Other (See Comments) 09/10/2015  . Pork-derived products  03/09/2016  . Latex Rash 03/10/2016  . Lisinopril Cough 02/13/2016    Past Medical History:  Diagnosis Date  . ADD (attention deficit disorder)   . Allergy    hayfever  . Anxiety   . Back pain   . Bipolar 1 disorder ([place])   . Cataract    right eye-removed  . Chronic pain   . Constipation   . Decreased hearing   . Depression   . Diabetes mellitus without complication  ([place])    Type II  . Dry skin   . DVT (deep venous thrombosis) ([place]) 2004   leg (not sure which leg)  . Dyslipidemia   . Dyspnea    with exertion  . Ear drainage   . Excessive hunger   . Excessive thirst   . Eye pain   . Fatigue   . Frequent urination   . GERD (gastroesophageal reflux disease)   . Hard of hearing   . Hyperlipidemia   . Hypertension   . Itching   . Joint pain   . Lactose intolerance   . Leg cramps   . Mouth sores   . Nasal and sinus discharge   . Nausea   . Numbness and tingling    bilateral  . OSA (obstructive sleep apnea)    wears CPAP  . Shortness of breath   . Shortness of breath on exertion   .  Sinus congestion   . Sleep apnea    uses cpap  . Stiff neck   . Stress   . Swallowing difficulty   . Swelling of extremity   . Tinnitus   . Trouble in sleeping   . Urinary frequency   . Vision changes   . Weakness   . Wears glasses     Past Surgical History:  Procedure Laterality Date  . arm surgery Right   . BACK SURGERY  2004  . COLONOSCOPY    . ESOPHAGOGASTRODUODENOSCOPY    . EYE SURGERY Right    cataract  . SUBMANDIBULAR GLAND EXCISION Right 03/10/2016   Procedure: INTRAORAL  SUBMANDIBULAR  STONE REMOVAL;  Surgeon: Izora Gala, MD;  Location: [facility] OR;  Service: ENT;  Laterality: Right;    Family History  Problem Relation Age of Onset  . Diabetes Mother   . Hypertension Mother   . Hyperlipidemia Mother   . Kidney disease Mother   . Cancer Mother   . Breast cancer Mother   . Diabetes Brother   . Breast cancer Sister   . Breast cancer Sister   . Colon cancer Neg Hx   . Colon polyps Neg Hx   . Esophageal cancer Neg Hx   . Rectal cancer Neg Hx   . Stomach cancer Neg Hx     Social History   Socioeconomic History  . Marital status: Married    Spouse name: Bettie  . Number of children: Not on file  . Years of education: Not on file  . Highest education level: Not on file  Occupational History  . Occupation: Disabled  Tobacco  Use  . Smoking status: Never Smoker  . Smokeless tobacco: Never Used  Substance and Sexual Activity  . Alcohol use: No  . Drug use: No  . Sexual activity: Not on file  Other Topics Concern  . Not on file  Social History Narrative  . Not on file   Social Determinants of Health   Financial Resource Strain:   . Difficulty of Paying Living Expenses: Not on file  Food Insecurity:   . Worried About Charity fundraiser in the Last Year: Not on file  . Ran Out of Food in the Last Year: Not on file  Transportation Needs:   . Lack of Transportation (Medical): Not on file  . Lack of Transportation (Non-Medical): Not on file  Physical Activity:   . Days of Exercise per Week: Not on file  . Minutes of Exercise per Session: Not on file  Stress:   . Feeling of Stress : Not on file  Social Connections:   . Frequency of Communication with Friends and Family: Not on file  . Frequency of Social Gatherings with Friends and Family: Not on file  . Attends Religious Services: Not on file  . Active Member of Clubs or Organizations: Not on file  . Attends Archivist Meetings: Not on file  . Marital Status: Not on file  Intimate Partner Violence:   . Fear of Current or Ex-Partner: Not on file  . Emotionally Abused: Not on file  . Physically Abused: Not on file  . Sexually Abused: Not on file      Review of systems: All other review of systems negative except as mentioned in the HPI.   Physical Exam: Vitals:   12/28/19 1100  BP: 130/70  Pulse: 68   Body mass index is 33.67 kg/m. Gen:      No acute distress  HEENT:  sclera anicteric Abd:      soft, non-tender; no palpable masses, no distension Ext:    No edema Neuro: alert and oriented x 3 Psych: normal mood and affect  Data Reviewed:  Reviewed labs, radiology imaging, old records and pertinent past GI work up   Assessment and Plan/Recommendations:  74 year old very pleasant gentleman with history of obesity, OSA,  diabetes, hyperlipidemia with gastroparesis, erosive esophagitis and gastroduodenitis  Gastroparesis: Clinically symptoms improved with dietary modification.  Will hold off starting Reglan or domperidone given potential side effects Continue with small frequent meals with low carb low-fat diet  GERD with erosive esophagitis.  Continue omeprazole 40 mg twice daily for 3 months and then will plan to decrease it to once a day Continue antireflux measures  Erosive gastritis, await biopsy results.  Will plan for surveillance EGD to confirm healing of gastric superficial ulcers in 6 to 12 months  We will obtain records of prior colonoscopy from [facility] to review and determine when he needs to come in for recall colonoscopy  Fatty liver: Normal LFT based on labs in June 2021.  Please check LFT.  We will also check hepatitis panel and autoimmune work-up to exclude any other etiology for chronic liver disease He does not drink any alcohol, continue to abstain from it and avoid any hepatotoxins  Return in 6 months or sooner if needed  This visit required >45 minutes of patient care (this includes precharting, chart review, review of results, face-to-face time used for counseling as well as treatment plan and follow-up. The patient was provided an opportunity to ask questions and all were answered. The patient agreed with the plan and demonstrated an understanding of the instructions.  Damaris Hippo , MD    CC: Donald Prose, MD

## 2020-01-01 LAB — HEPATITIS B SURFACE ANTIBODY,QUALITATIVE: Hep B S Ab: NONREACTIVE

## 2020-01-01 LAB — HEPATITIS C ANTIBODY
Hepatitis C Ab: NONREACTIVE
SIGNAL TO CUT-OFF: 0.01 (ref <1.00)

## 2020-01-01 LAB — HEPATITIS A ANTIBODY, TOTAL: Hepatitis A AB,Total: REACTIVE — AB

## 2020-01-01 LAB — ANTI-SMOOTH MUSCLE ANTIBODY, IGG: Actin (Smooth Muscle) Antibody (IGG): <20 U (ref <20)

## 2020-01-01 LAB — HEPATITIS B SURFACE ANTIGEN: Hepatitis B Surface Ag: NONREACTIVE

## 2020-01-01 LAB — MITOCHONDRIAL ANTIBODIES: Mitochondrial M2 Ab, IgG: <=20 U

## 2020-01-01 LAB — CERULOPLASMIN: Ceruloplasmin: 36 mg/dL (ref 18–36)

## 2020-01-01 LAB — ALPHA-1-ANTITRYPSIN: A-1 Antitrypsin, Ser: 151 mg/dL (ref 83–199)

## 2020-01-03 ENCOUNTER — Other Ambulatory Visit: Payer: Self-pay

## 2020-01-03 ENCOUNTER — Ambulatory Visit (INDEPENDENT_AMBULATORY_CARE_PROVIDER_SITE_OTHER): Payer: Medicare Other | Admitting: Family Medicine

## 2020-01-03 ENCOUNTER — Encounter (INDEPENDENT_AMBULATORY_CARE_PROVIDER_SITE_OTHER): Payer: Self-pay | Admitting: Family Medicine

## 2020-01-03 VITALS — BP 131/73 | HR 82 | Temp 98.3°F | Ht 70.0 in | Wt 231.0 lb

## 2020-01-03 DIAGNOSIS — E559 Vitamin D deficiency, unspecified: Secondary | ICD-10-CM

## 2020-01-03 DIAGNOSIS — E1165 Type 2 diabetes mellitus with hyperglycemia: Secondary | ICD-10-CM

## 2020-01-03 DIAGNOSIS — E669 Obesity, unspecified: Secondary | ICD-10-CM | POA: Diagnosis not present

## 2020-01-03 DIAGNOSIS — K2100 Gastro-esophageal reflux disease with esophagitis, without bleeding: Secondary | ICD-10-CM | POA: Diagnosis not present

## 2020-01-03 DIAGNOSIS — Z6833 Body mass index (BMI) 33.0-33.9, adult: Secondary | ICD-10-CM

## 2020-01-03 NOTE — Progress Notes (Signed)
Chief Complaint:   OBESITY Max [name] is here to discuss his progress with his obesity treatment plan along with follow-up of his obesity related diagnoses. Max [name] is on the Category 3 Plan and states he is following his eating plan approximately 70-80% of the time. Max [name] states he is walking 1/2 mile 3 times per week.  Today's visit was #: 76 Starting weight: 273 lbs Starting date: 09/28/2017 Today's weight: 231 lbs Today's date: 01/03/2020 Total lbs lost to date: 42 Total lbs lost since last in-office visit: 4  Interim History: Max [name] voices the last few appointments he has had much stronger portion control and control of carbohydrates. He denies hunger. He is eating quite a bit of yogurt, cauliflower rice, baked beans, and corn. He voices he isn't eating as much protein in terms of animal protein. He is not meal centric, tends to eat smaller amounts throughout the day.  Subjective:   1. Type 2 diabetes mellitus with hyperglycemia, without long-term current use of insulin ([place]) Max [name] forgot his meter and had knocked off continuous glucose meter accidentally. He denies feelings of hypoglycemia. He stopped all insulin, only taking Jardiance and [name].  2. Gastroesophageal reflux disease with esophagitis, unspecified whether hemorrhage Max [name] is taking omeprazole BID. He is seeing [name].   3. Vitamin D deficiency Max [name] is not on prescription Vit D, but he is on OTC Vit D. He notes fatigue.  Assessment/Plan:   1. Type 2 diabetes mellitus with hyperglycemia, without long-term current use of insulin (HCC) Good blood sugar control is important to decrease the likelihood of diabetic complications such as nephropathy, neuropathy, limb loss, blindness, coronary artery disease, and death. Intensive lifestyle modification including diet, exercise and weight loss are the first line of treatment for diabetes. We will check labs today, and Max [name] will continue to  follow up as directed.  - Comprehensive metabolic panel - Hemoglobin A1c - Insulin, random - Lipid Panel With LDL/HDL Ratio  2. Gastroesophageal reflux disease with esophagitis, unspecified whether hemorrhage Intensive lifestyle modifications are the first line treatment for this issue. We discussed several lifestyle modifications today. Max [name] will follow up with GI in 5 months. He will continue to work on diet, exercise and weight loss efforts. Orders and follow up as documented in patient record.   Counseling . If a person has gastroesophageal reflux disease (GERD), food and stomach acid move back up into the esophagus and cause symptoms or problems such as damage to the esophagus. . Anti-reflux measures include: raising the head of the bed, avoiding tight clothing or belts, avoiding eating late at night, not lying down shortly after mealtime, and achieving weight loss. . Avoid ASA, NSAID's, caffeine, alcohol, and tobacco.  . OTC Pepcid and/or Tums are often very helpful for as needed use.  Marland Kitchen However, for persisting chronic or daily symptoms, stronger medications like Omeprazole may be needed. . You may need to avoid foods and drinks such as: ? Coffee and tea (with or without caffeine). ? Drinks that contain alcohol. ? Energy drinks and sports drinks. ? Bubbly (carbonated) drinks or sodas. ? Chocolate and cocoa. ? Peppermint and mint flavorings. ? Garlic and onions. ? Horseradish. ? Spicy and acidic foods. These include peppers, chili powder, curry powder, vinegar, hot sauces, and BBQ sauce. ? Citrus fruit juices and citrus fruits, such as oranges, lemons, and limes. ? Tomato-based foods. These include red sauce, chili, salsa, and pizza with red sauce. ? Fried and fatty foods. These include donuts, french fries, potato  chips, and high-fat dressings. ? High-fat meats. These include hot dogs, rib eye steak, sausage, ham, and bacon.  3. Vitamin D deficiency Low Vitamin D level  contributes to fatigue and are associated with obesity, breast, and colon cancer. We will check labs today. Max [name] will follow-up for routine testing of Vitamin D, at least 2-3 times per year to avoid over-replacement.  - VITAMIN D 25 Hydroxy (Vit-D Deficiency, Fractures)  4. Class 1 obesity with serious comorbidity and body mass index (BMI) of 33.0 to 33.9 in adult, unspecified obesity type Max [name] is currently in the action stage of change. As such, his goal is to continue with weight loss efforts. He has agreed to the Category 3 Plan.   Exercise goals: All adults should avoid inactivity. Some physical activity is better than none, and adults who participate in any amount of physical activity gain some health benefits.  Behavioral modification strategies: increasing lean protein intake, meal planning and cooking strategies, keeping healthy foods in the home and planning for success.  Max [name] has agreed to follow-up with our clinic in 3 to 4 weeks. He was informed of the importance of frequent follow-up visits to maximize his success with intensive lifestyle modifications for his multiple health conditions.   Max [name] was informed we would discuss his lab results at his next visit unless there is a critical issue that needs to be addressed sooner. Max [name] agreed to keep his next visit at the agreed upon time to discuss these results.  Objective:   Blood pressure 131/73, pulse 82, temperature 98.3 F (36.8 C), temperature source Oral, height 5\' 10"  (1.778 m), weight 231 lb (104.8 kg), SpO2 99 %. Body mass index is 33.15 kg/m.  General: Cooperative, alert, well developed, in no acute distress. HEENT: Conjunctivae and lids unremarkable. Cardiovascular: Regular rhythm.  Lungs: Normal work of breathing. Neurologic: No focal deficits.   Lab Results  Component Value Date   CREATININE 1.01 09/06/2019   BUN 14 09/06/2019   NA 140 09/06/2019   K 3.7 09/06/2019   CL 97 09/06/2019   CO2 28  09/06/2019   Lab Results  Component Value Date   ALT 23 12/28/2019   AST 17 12/28/2019   ALKPHOS 96 12/28/2019   BILITOT 0.4 12/28/2019   Lab Results  Component Value Date   HGBA1C 7.6 (H) 12/11/2018   HGBA1C 7.7 (H) 05/15/2018   HGBA1C 8.3 (H) 09/28/2017   HGBA1C 7.2 (H) 03/09/2016   HGBA1C 7.9 08/12/2015   No results found for: INSULIN Lab Results  Component Value Date   TSH 0.471 09/28/2017   Lab Results  Component Value Date   CHOL 190 09/06/2019   HDL 55 09/06/2019   LDLCALC 121 (H) 09/06/2019   TRIG 74 09/06/2019   CHOLHDL 3.8 Ratio 01/09/2008   Lab Results  Component Value Date   WBC 12.0 (H) 09/27/2019   HGB 14.3 09/27/2019   HCT 43.7 09/27/2019   MCV 79.4 09/27/2019   PLT 251.0 09/27/2019   Lab Results  Component Value Date   IRON 65 09/27/2019   TIBC 309 09/27/2019   FERRITIN 123.8 09/27/2019    Obesity Behavioral Intervention:   Approximately 15 minutes were spent on the discussion below.  ASK: We discussed the diagnosis of obesity with Max [name] today and Max [name] agreed to give [name] permission to discuss obesity behavioral modification therapy today.  ASSESS: Max [name] has the diagnosis of obesity and his BMI today is 33.15. Max [name] is in the action stage of change.   ADVISE:  Max [name] was educated on the multiple health risks of obesity as well as the benefit of weight loss to improve his health. He was advised of the need for long term treatment and the importance of lifestyle modifications to improve his current health and to decrease his risk of future health problems.  AGREE: Multiple dietary modification options and treatment options were discussed and Max [name] agreed to follow the recommendations documented in the above note.  ARRANGE: Max [name] was educated on the importance of frequent visits to treat obesity as outlined per CMS and USPSTF guidelines and agreed to schedule his next follow up appointment today.  Attestation Statements:    Reviewed by clinician on day of visit: allergies, medications, problem list, medical history, surgical history, family history, social history, and previous encounter notes.   I, Trixie Dredge, am acting as transcriptionist for [name], MD.  I have reviewed the above documentation for accuracy and completeness, and I agree with the above. - Jinny Blossom, MD

## 2020-01-04 LAB — COMPREHENSIVE METABOLIC PANEL
ALT: 18 IU/L (ref 0–44)
AST: 13 IU/L (ref 0–40)
Albumin/Globulin Ratio: 1.7 (ref 1.2–2.2)
Albumin: 4.7 g/dL (ref 3.7–4.7)
Alkaline Phosphatase: 99 IU/L (ref 44–121)
BUN/Creatinine Ratio: 14 (ref 10–24)
BUN: 11 mg/dL (ref 8–27)
Bilirubin Total: 0.4 mg/dL (ref 0.0–1.2)
CO2: 27 mmol/L (ref 20–29)
Calcium: 9.8 mg/dL (ref 8.6–10.2)
Chloride: 102 mmol/L (ref 96–106)
Creatinine, Ser: 0.78 mg/dL (ref 0.76–1.27)
GFR calc Af Amer: 104 mL/min/{1.73_m2} (ref >59)
GFR calc non Af Amer: 90 mL/min/{1.73_m2} (ref >59)
Globulin, Total: 2.7 g/dL (ref 1.5–4.5)
Glucose: 175 mg/dL — ABNORMAL HIGH (ref 65–99)
Potassium: 4.4 mmol/L (ref 3.5–5.2)
Sodium: 143 mmol/L (ref 134–144)
Total Protein: 7.4 g/dL (ref 6.0–8.5)

## 2020-01-04 LAB — LIPID PANEL WITH LDL/HDL RATIO
Cholesterol, Total: 171 mg/dL (ref 100–199)
HDL: 51 mg/dL (ref >39)
LDL Chol Calc (NIH): 103 mg/dL — ABNORMAL HIGH (ref 0–99)
LDL/HDL Ratio: 2 ratio (ref 0.0–3.6)
Triglycerides: 90 mg/dL (ref 0–149)
VLDL Cholesterol Cal: 17 mg/dL (ref 5–40)

## 2020-01-04 LAB — VITAMIN D 25 HYDROXY (VIT D DEFICIENCY, FRACTURES): Vit D, 25-Hydroxy: 60.1 ng/mL (ref 30.0–100.0)

## 2020-01-04 LAB — HEMOGLOBIN A1C
Est. average glucose Bld gHb Est-mCnc: 180 mg/dL
Hgb A1c MFr Bld: 7.9 % — ABNORMAL HIGH (ref 4.8–5.6)

## 2020-01-04 LAB — INSULIN, RANDOM: INSULIN: 8.1 u[IU]/mL (ref 2.6–24.9)

## 2020-01-16 ENCOUNTER — Encounter: Payer: Self-pay | Admitting: Gastroenterology

## 2020-01-23 ENCOUNTER — Ambulatory Visit (INDEPENDENT_AMBULATORY_CARE_PROVIDER_SITE_OTHER): Payer: Medicare Other | Admitting: Family Medicine

## 2020-01-23 ENCOUNTER — Encounter (INDEPENDENT_AMBULATORY_CARE_PROVIDER_SITE_OTHER): Payer: Self-pay | Admitting: Family Medicine

## 2020-01-23 ENCOUNTER — Other Ambulatory Visit: Payer: Self-pay

## 2020-01-23 VITALS — BP 133/78 | HR 91 | Temp 98.1°F | Ht 70.0 in | Wt 232.0 lb

## 2020-01-23 DIAGNOSIS — E1169 Type 2 diabetes mellitus with other specified complication: Secondary | ICD-10-CM | POA: Diagnosis not present

## 2020-01-23 DIAGNOSIS — E7849 Other hyperlipidemia: Secondary | ICD-10-CM | POA: Diagnosis not present

## 2020-01-23 DIAGNOSIS — Z6833 Body mass index (BMI) 33.0-33.9, adult: Secondary | ICD-10-CM

## 2020-01-23 DIAGNOSIS — E669 Obesity, unspecified: Secondary | ICD-10-CM | POA: Diagnosis not present

## 2020-01-28 NOTE — Progress Notes (Signed)
Chief Complaint:   OBESITY Max [name] is here to discuss his progress with his obesity treatment plan along with follow-up of his obesity related diagnoses. Max [name] is on the Category 3 Plan and states he is following his eating plan approximately 50% of the time. Max [name] states he is walking for 20 minutes 3 times per week.  Today's visit was #: 28 Starting weight: 273 lbs Starting date: 09/28/2017 Today's weight: 232 lbs Today's date: 01/23/2020 Total lbs lost to date: 41 Total lbs lost since last in-office visit: 0  Interim History: Max [name] is in a lot of pain with change in weather. Her has been walking more in stores. He has been "conscious of spirit of diet". He is sticking to meat portions but has been substituting rye bread and has been otherwise getting in vegetables, fruit, and yogurt. He has not eaten a [place] potato in the last 2 weeks.  Subjective:   1. Other hyperlipidemia Max [name] is on Crestor 5 mg. He denies transaminitis. Last LDL was 103, triglycerides 90, and HDL 51. His LDL improved from previous draw.   2. Type 2 diabetes mellitus with other specified complication, without long-term current use of insulin (HCC) Max [name] is awaiting CGM sensor and then some sensors did not sync with his phone. He denies hypoglycemia (lowest BGs was 78). He has only taken insulin 1 time.  Assessment/Plan:   1. Other hyperlipidemia Cardiovascular risk and specific lipid/LDL goals reviewed. We discussed several lifestyle modifications today. Max [name] will continue Crestor, and will continue to work on diet, exercise and weight loss efforts. Orders and follow up as documented in patient record.   Counseling Intensive lifestyle modifications are the first line treatment for this issue. . Dietary changes: Increase soluble fiber. Decrease simple carbohydrates. . Exercise changes: Moderate to vigorous-intensity aerobic activity 150 minutes per week if tolerated. . Lipid-lowering  medications: see documented in medical record.  2. Type 2 diabetes mellitus with other specified complication, without long-term current use of insulin (HCC) Good blood sugar control is important to decrease the likelihood of diabetic complications such as nephropathy, neuropathy, limb loss, blindness, coronary artery disease, and death. Intensive lifestyle modification including diet, exercise and weight loss are the first line of treatment for diabetes. We will follow up at Max [name]'s next appointment.  3. Class 1 obesity with serious comorbidity and body mass index (BMI) of 33.0 to 33.9 in adult, unspecified obesity type Max [name] is currently in the action stage of change. As such, his goal is to continue with weight loss efforts. He has agreed to the Category 3 Plan.   Exercise goals: As is.  Behavioral modification strategies: increasing lean protein intake, decreasing simple carbohydrates and better snacking choices.  Max [name] has agreed to follow-up with our clinic in 3 weeks. He was informed of the importance of frequent follow-up visits to maximize his success with intensive lifestyle modifications for his multiple health conditions.   Objective:   Blood pressure 133/78, pulse 91, temperature 98.1 F (36.7 C), temperature source Oral, height 5\' 10"  (1.778 m), weight 232 lb (105.2 kg), SpO2 97 %. Body mass index is 33.29 kg/m.  General: Cooperative, alert, well developed, in no acute distress. HEENT: Conjunctivae and lids unremarkable. Cardiovascular: Regular rhythm.  Lungs: Normal work of breathing. Neurologic: No focal deficits.   Lab Results  Component Value Date   CREATININE 0.78 01/03/2020   BUN 11 01/03/2020   NA 143 01/03/2020   K 4.4 01/03/2020   CL 102 01/03/2020   CO2  27 01/03/2020   Lab Results  Component Value Date   ALT 18 01/03/2020   AST 13 01/03/2020   ALKPHOS 99 01/03/2020   BILITOT 0.4 01/03/2020   Lab Results  Component Value Date   HGBA1C 7.9 (H)  01/03/2020   HGBA1C 7.6 (H) 12/11/2018   HGBA1C 7.7 (H) 05/15/2018   HGBA1C 8.3 (H) 09/28/2017   HGBA1C 7.2 (H) 03/09/2016   Lab Results  Component Value Date   INSULIN 8.1 01/03/2020   Lab Results  Component Value Date   TSH 0.471 09/28/2017   Lab Results  Component Value Date   CHOL 171 01/03/2020   HDL 51 01/03/2020   LDLCALC 103 (H) 01/03/2020   TRIG 90 01/03/2020   CHOLHDL 3.8 Ratio 01/09/2008   Lab Results  Component Value Date   WBC 12.0 (H) 09/27/2019   HGB 14.3 09/27/2019   HCT 43.7 09/27/2019   MCV 79.4 09/27/2019   PLT 251.0 09/27/2019   Lab Results  Component Value Date   IRON 65 09/27/2019   TIBC 309 09/27/2019   FERRITIN 123.8 09/27/2019   Attestation Statements:   Reviewed by clinician on day of visit: allergies, medications, problem list, medical history, surgical history, family history, social history, and previous encounter notes.  Time spent on visit including pre-visit chart review and post-visit care and charting was 16 minutes.    I, Trixie Dredge, am acting as transcriptionist for [name], MD.  I have reviewed the above documentation for accuracy and completeness, and I agree with the above. - Jinny Blossom, MD

## 2020-02-18 ENCOUNTER — Encounter (INDEPENDENT_AMBULATORY_CARE_PROVIDER_SITE_OTHER): Payer: Self-pay | Admitting: Family Medicine

## 2020-02-18 ENCOUNTER — Other Ambulatory Visit: Payer: Self-pay

## 2020-02-18 ENCOUNTER — Ambulatory Visit (INDEPENDENT_AMBULATORY_CARE_PROVIDER_SITE_OTHER): Payer: Medicare Other | Admitting: Family Medicine

## 2020-02-18 VITALS — BP 150/81 | HR 85 | Temp 97.8°F | Ht 70.0 in | Wt 227.0 lb

## 2020-02-18 DIAGNOSIS — I152 Hypertension secondary to endocrine disorders: Secondary | ICD-10-CM | POA: Diagnosis not present

## 2020-02-18 DIAGNOSIS — Z6832 Body mass index (BMI) 32.0-32.9, adult: Secondary | ICD-10-CM

## 2020-02-18 DIAGNOSIS — E669 Obesity, unspecified: Secondary | ICD-10-CM

## 2020-02-18 DIAGNOSIS — E1165 Type 2 diabetes mellitus with hyperglycemia: Secondary | ICD-10-CM | POA: Diagnosis not present

## 2020-02-18 DIAGNOSIS — E1159 Type 2 diabetes mellitus with other circulatory complications: Secondary | ICD-10-CM | POA: Diagnosis not present

## 2020-02-19 NOTE — Progress Notes (Signed)
Chief Complaint:   OBESITY Max [name] is here to discuss his progress with his obesity treatment plan along with follow-up of his obesity related diagnoses. Max [name] is on the Category 3 Plan and states he is following his eating plan approximately 80% of the time. Max [name] states he is walking the dog for 30 minutes 7 times per week.  Today's visit was #: 50 Starting weight: 273 lbs Starting date: 09/28/2017 Today's weight: 227 lbs Today's date: 02/18/2020 Total lbs lost to date: 46 Total lbs lost since last in-office visit: 5  Interim History: Max [name] reports that he is going to [employer] for Thanksgiving; he is concerned about going because of COVID. He is flying to [place]. He eating most of the same foods and trying to be mindful of carbohydrates. May be doing something with the family for Christmas. He does not anticipate any obstacles in the next few weeks.  Subjective:   1. Hypertension associated with diabetes (Max [name]) Max [name]'s blood pressure is elevated today. He denies chest pain, chest pressure, or headache. He is on losartan and amlodipine.   2. Type 2 diabetes mellitus with hyperglycemia, without long-term current use of insulin (HCC) Max [name]'s blood sugar averages between 180 and 200 (often post prandial). He denies hypoglycemia episodes. He is not taking insulin, and he is on Max [name] and [name].  Assessment/Plan:   1. Hypertension associated with diabetes ([place]) Lyndal will continue his current medications, no change in dose. He will continue working on healthy weight loss and exercise to improve blood pressure control. We will watch for signs of hypotension as he continues his lifestyle modifications.  2. Type 2 diabetes mellitus with hyperglycemia, without long-term current use of insulin (HCC) Good blood sugar control is important to decrease the likelihood of diabetic complications such as nephropathy, neuropathy, limb loss, blindness, coronary artery disease,  and death. Intensive lifestyle modification including diet, exercise and weight loss are the first line of treatment for diabetes. Max [name] will continue his current medications, no change in dose.  3. Class 1 obesity with serious comorbidity and body mass index (BMI) of 32.0 to 32.9 in adult, unspecified obesity type Max [name] is currently in the action stage of change. As such, his goal is to continue with weight loss efforts. He has agreed to the Category 3 Plan.   Exercise goals: No exercise has been prescribed at this time.  Behavioral modification strategies: increasing lean protein intake, meal planning and cooking strategies, travel eating strategies and holiday eating strategies .  Max [name] has agreed to follow-up with our clinic in 4 weeks. He was informed of the importance of frequent follow-up visits to maximize his success with intensive lifestyle modifications for his multiple health conditions.   Objective:   Blood pressure (!) 150/81, pulse 85, temperature 97.8 F (36.6 C), temperature source Oral, height 5\' 10"  (1.778 m), weight 227 lb (103 kg), SpO2 96 %. Body mass index is 32.57 kg/m.  General: Cooperative, alert, well developed, in no acute distress. HEENT: Conjunctivae and lids unremarkable. Cardiovascular: Regular rhythm.  Lungs: Normal work of breathing. Neurologic: No focal deficits.   Lab Results  Component Value Date   CREATININE 0.78 01/03/2020   BUN 11 01/03/2020   NA 143 01/03/2020   K 4.4 01/03/2020   CL 102 01/03/2020   CO2 27 01/03/2020   Lab Results  Component Value Date   ALT 18 01/03/2020   AST 13 01/03/2020   ALKPHOS 99 01/03/2020   BILITOT 0.4 01/03/2020   Lab Results  Component Value Date   HGBA1C 7.9 (H) 01/03/2020   HGBA1C 7.6 (H) 12/11/2018   HGBA1C 7.7 (H) 05/15/2018   HGBA1C 8.3 (H) 09/28/2017   HGBA1C 7.2 (H) 03/09/2016   Lab Results  Component Value Date   INSULIN 8.1 01/03/2020   Lab Results  Component Value Date   TSH  0.471 09/28/2017   Lab Results  Component Value Date   CHOL 171 01/03/2020   HDL 51 01/03/2020   LDLCALC 103 (H) 01/03/2020   TRIG 90 01/03/2020   CHOLHDL 3.8 Ratio 01/09/2008   Lab Results  Component Value Date   WBC 12.0 (H) 09/27/2019   HGB 14.3 09/27/2019   HCT 43.7 09/27/2019   MCV 79.4 09/27/2019   PLT 251.0 09/27/2019   Lab Results  Component Value Date   IRON 65 09/27/2019   TIBC 309 09/27/2019   FERRITIN 123.8 09/27/2019   Attestation Statements:   Reviewed by clinician on day of visit: allergies, medications, problem list, medical history, surgical history, family history, social history, and previous encounter notes.  Time spent on visit including pre-visit chart review and post-visit care and charting was 16 minutes.    I, Trixie Dredge, am acting as transcriptionist for [name], MD.  I have reviewed the above documentation for accuracy and completeness, and I agree with the above. - Jinny Blossom, MD

## 2020-03-17 ENCOUNTER — Encounter (INDEPENDENT_AMBULATORY_CARE_PROVIDER_SITE_OTHER): Payer: Self-pay | Admitting: Family Medicine

## 2020-03-17 ENCOUNTER — Other Ambulatory Visit: Payer: Self-pay

## 2020-03-17 ENCOUNTER — Ambulatory Visit (INDEPENDENT_AMBULATORY_CARE_PROVIDER_SITE_OTHER): Payer: Medicare Other | Admitting: Family Medicine

## 2020-03-17 VITALS — BP 152/78 | HR 95 | Temp 98.1°F | Ht 70.0 in | Wt 229.0 lb

## 2020-03-17 DIAGNOSIS — Z6832 Body mass index (BMI) 32.0-32.9, adult: Secondary | ICD-10-CM | POA: Diagnosis not present

## 2020-03-17 DIAGNOSIS — I10 Essential (primary) hypertension: Secondary | ICD-10-CM | POA: Diagnosis not present

## 2020-03-17 DIAGNOSIS — E1165 Type 2 diabetes mellitus with hyperglycemia: Secondary | ICD-10-CM

## 2020-03-17 DIAGNOSIS — E669 Obesity, unspecified: Secondary | ICD-10-CM

## 2020-03-18 NOTE — Progress Notes (Signed)
Chief Complaint:   OBESITY Max [name] is here to discuss his progress with his obesity treatment plan along with follow-up of his obesity related diagnoses. Max [name] is on the Category 3 Plan and states he is following his eating plan approximately 70% of the time. Max [name] states he is walking the dog for 25 minutes 7 times per week.  Today's visit was #: 50 Starting weight: 273 lbs Starting date: 09/28/2017 Today's weight: 229 lbs Today's date: 03/17/2020 Total lbs lost to date: 44 Total lbs lost since last in-office visit: 0  Interim History: Max [name] has been following the plan in the spirit of Christmas. His pain level is more tolerable today. He voices he is eating quite a bit of fish, yogurt, and occasionally poultry. He is getting eggs in his shake. He is working on controlling his carbohydrate intake. He is trying a new adhesive pain control patch.  Subjective:   1. Essential hypertension Max [name] blood pressure is elevated today. He denies chest pain, chest pressure, or headache. He is on Cozaar, Norvasc, Cardura. His last blood pressure last week was 220'U systolically.  2. Type 2 diabetes mellitus with hyperglycemia, without long-term current use of insulin (HCC) Max [name] fasting BGs range between 130 and 260's, and he denies hypoglycemia. He is on [place], but he is not taking insulin regularly.  Assessment/Plan:   1. Essential hypertension Max [name] is working on healthy weight loss and exercise to improve blood pressure control. We will watch for signs of hypotension as he continues his lifestyle modifications. We will follow up on his blood pressure at his next appointment.  2. Type 2 diabetes mellitus with hyperglycemia, without long-term current use of insulin (HCC) Good blood sugar control is important to decrease the likelihood of diabetic complications such as nephropathy, neuropathy, limb loss, blindness, coronary artery disease, and death. Intensive  lifestyle modification including diet, exercise and weight loss are the first line of treatment for diabetes. Max [name] will continue current management per Dr. Buddy Duty.  3. Class 1 obesity with serious comorbidity and body mass index (BMI) of 32.0 to 32.9 in adult, unspecified obesity type Max [name] is currently in the action stage of change. As such, his goal is to continue with weight loss efforts. He has agreed to the Category 3 Plan.   Exercise goals: As is.  Behavioral modification strategies: increasing lean protein intake, meal planning and cooking strategies, keeping healthy foods in the home, holiday eating strategies  and planning for success.  Max [name] has agreed to follow-up with our clinic in 3 to 4 weeks. He was informed of the importance of frequent follow-up visits to maximize his success with intensive lifestyle modifications for his multiple health conditions.   Objective:   Blood pressure (!) 152/78, pulse 95, temperature 98.1 F (36.7 C), temperature source Oral, height 5\' 10"  (1.778 m), weight 229 lb (103.9 kg), SpO2 98 %. Body mass index is 32.86 kg/m.  General: Cooperative, alert, well developed, in no acute distress. HEENT: Conjunctivae and lids unremarkable. Cardiovascular: Regular rhythm.  Lungs: Normal work of breathing. Neurologic: No focal deficits.   Lab Results  Component Value Date   CREATININE 0.78 01/03/2020   BUN 11 01/03/2020   NA 143 01/03/2020   K 4.4 01/03/2020   CL 102 01/03/2020   CO2 27 01/03/2020   Lab Results  Component Value Date   ALT 18 01/03/2020   AST 13 01/03/2020   ALKPHOS 99 01/03/2020   BILITOT 0.4 01/03/2020   Lab Results  Component Value Date   HGBA1C 7.9 (H) 01/03/2020   HGBA1C 7.6 (H) 12/11/2018   HGBA1C 7.7 (H) 05/15/2018   HGBA1C 8.3 (H) 09/28/2017   HGBA1C 7.2 (H) 03/09/2016   Lab Results  Component Value Date   INSULIN 8.1 01/03/2020   Lab Results  Component Value Date   TSH 0.471 09/28/2017   Lab Results   Component Value Date   CHOL 171 01/03/2020   HDL 51 01/03/2020   LDLCALC 103 (H) 01/03/2020   TRIG 90 01/03/2020   CHOLHDL 3.8 Ratio 01/09/2008   Lab Results  Component Value Date   WBC 12.0 (H) 09/27/2019   HGB 14.3 09/27/2019   HCT 43.7 09/27/2019   MCV 79.4 09/27/2019   PLT 251.0 09/27/2019   Lab Results  Component Value Date   IRON 65 09/27/2019   TIBC 309 09/27/2019   FERRITIN 123.8 09/27/2019   Attestation Statements:   Reviewed by clinician on day of visit: allergies, medications, problem list, medical history, surgical history, family history, social history, and previous encounter notes.   I, Trixie Dredge, am acting as transcriptionist for [name], MD.  I have reviewed the above documentation for accuracy and completeness, and I agree with the above. - Jinny Blossom, MD

## 2020-04-04 DIAGNOSIS — Z794 Long term (current) use of insulin: Secondary | ICD-10-CM | POA: Diagnosis not present

## 2020-04-04 DIAGNOSIS — E119 Type 2 diabetes mellitus without complications: Secondary | ICD-10-CM | POA: Diagnosis not present

## 2020-04-07 ENCOUNTER — Ambulatory Visit (INDEPENDENT_AMBULATORY_CARE_PROVIDER_SITE_OTHER): Payer: Medicare Other | Admitting: Family Medicine

## 2020-04-07 ENCOUNTER — Encounter (INDEPENDENT_AMBULATORY_CARE_PROVIDER_SITE_OTHER): Payer: Self-pay | Admitting: Family Medicine

## 2020-04-07 ENCOUNTER — Other Ambulatory Visit: Payer: Self-pay

## 2020-04-07 VITALS — BP 147/78 | HR 103 | Temp 98.2°F | Ht 70.0 in | Wt 229.0 lb

## 2020-04-07 DIAGNOSIS — E1165 Type 2 diabetes mellitus with hyperglycemia: Secondary | ICD-10-CM | POA: Diagnosis not present

## 2020-04-07 DIAGNOSIS — E669 Obesity, unspecified: Secondary | ICD-10-CM | POA: Diagnosis not present

## 2020-04-07 DIAGNOSIS — Z6832 Body mass index (BMI) 32.0-32.9, adult: Secondary | ICD-10-CM | POA: Diagnosis not present

## 2020-04-07 DIAGNOSIS — G8929 Other chronic pain: Secondary | ICD-10-CM

## 2020-04-08 DIAGNOSIS — H4321 Crystalline deposits in vitreous body, right eye: Secondary | ICD-10-CM | POA: Diagnosis not present

## 2020-04-08 DIAGNOSIS — Z961 Presence of intraocular lens: Secondary | ICD-10-CM | POA: Diagnosis not present

## 2020-04-08 DIAGNOSIS — H52203 Unspecified astigmatism, bilateral: Secondary | ICD-10-CM | POA: Diagnosis not present

## 2020-04-08 DIAGNOSIS — Z794 Long term (current) use of insulin: Secondary | ICD-10-CM | POA: Diagnosis not present

## 2020-04-08 DIAGNOSIS — H524 Presbyopia: Secondary | ICD-10-CM | POA: Diagnosis not present

## 2020-04-08 DIAGNOSIS — H25812 Combined forms of age-related cataract, left eye: Secondary | ICD-10-CM | POA: Diagnosis not present

## 2020-04-08 DIAGNOSIS — E1136 Type 2 diabetes mellitus with diabetic cataract: Secondary | ICD-10-CM | POA: Diagnosis not present

## 2020-04-08 DIAGNOSIS — H5213 Myopia, bilateral: Secondary | ICD-10-CM | POA: Diagnosis not present

## 2020-04-08 DIAGNOSIS — Z7984 Long term (current) use of oral hypoglycemic drugs: Secondary | ICD-10-CM | POA: Diagnosis not present

## 2020-04-09 NOTE — Progress Notes (Signed)
Chief Complaint:   OBESITY Max [name] is here to discuss his progress with his obesity treatment plan along with follow-up of his obesity related diagnoses. [name] is on the Category 3 Plan and states he is following his eating plan approximately 70% of the time. Max [name] states he  walk 1/2 mile 6-7 times per week.  Today's visit was #: 30 Starting weight: 273 lbs Starting date: 09/28/2017 Today's weight: 229 lbs Today's date: 04/07/2020 Total lbs lost to date: 44 lbs Total lbs lost since last in-office visit: 0  Interim History: [name] had a low key Christmas and New Years. He has been doing yogurt, fish,baked chicken, cauliflower, rice, mixed veggies, and smoothies with fruits and vegetables. [name] feels he is taking so much medication. He has not gone to the pool due to Covid.  Subjective:   1. Type 2 diabetes mellitus with hyperglycemia, without long-term current use of insulin (HCC) Max [name] is on [name] and [name]. Nausea is better controlled. Blood sugar 180-190s. Highest BS 220-240.(postprandial) lowest BS 101(did use 5 units of insulin time 1 episode)  2. Other chronic pain Jensyn is seeing pain management.    Assessment/Plan:   1. Type 2 diabetes mellitus with hyperglycemia, without long-term current use of insulin (HCC) Max [name] will follow up with Endocrine and continue current medications.  2. Other chronic pain Max [name] will follow up with pain specialist.   Max [name] is currently in the action stage of change. As such, his goal is to continue with weight loss efforts. He has agreed to the Category 3 Plan.   Exercise goals: No exercise has been prescribed at this time.  Behavioral modification strategies: increasing lean protein intake, meal planning and cooking strategies, keeping healthy foods in the home and planning for success.  Max [name] has agreed to follow-up with our clinic in 3-4 weeks. He was informed of the importance of frequent follow-up  visits to maximize his success with intensive lifestyle modifications for his multiple health conditions.   Objective:   Blood pressure (!) 147/78, pulse (!) 103, temperature 98.2 F (36.8 C), temperature source Oral, height 5\' 10"  (1.778 m), weight 229 lb (103.9 kg), SpO2 96 %. Body mass index is 32.86 kg/m.  General: Cooperative, alert, well developed, in no acute distress. HEENT: Conjunctivae and lids unremarkable. Cardiovascular: Regular rhythm.  Lungs: Normal work of breathing. Neurologic: No focal deficits.   Lab Results  Component Value Date   CREATININE 0.78 01/03/2020   BUN 11 01/03/2020   NA 143 01/03/2020   K 4.4 01/03/2020   CL 102 01/03/2020   CO2 27 01/03/2020   Lab Results  Component Value Date   ALT 18 01/03/2020   AST 13 01/03/2020   ALKPHOS 99 01/03/2020   BILITOT 0.4 01/03/2020   Lab Results  Component Value Date   HGBA1C 7.9 (H) 01/03/2020   HGBA1C 7.6 (H) 12/11/2018   HGBA1C 7.7 (H) 05/15/2018   HGBA1C 8.3 (H) 09/28/2017   HGBA1C 7.2 (H) 03/09/2016   Lab Results  Component Value Date   INSULIN 8.1 01/03/2020   Lab Results  Component Value Date   TSH 0.471 09/28/2017   Lab Results  Component Value Date   CHOL 171 01/03/2020   HDL 51 01/03/2020   LDLCALC 103 (H) 01/03/2020   TRIG 90 01/03/2020   CHOLHDL 3.8 Ratio 01/09/2008   Lab Results  Component Value Date   WBC 12.0 (H) 09/27/2019   HGB 14.3 09/27/2019   HCT 43.7 09/27/2019   MCV 79.4 09/27/2019  PLT 251.0 09/27/2019   Lab Results  Component Value Date   IRON 65 09/27/2019   TIBC 309 09/27/2019   FERRITIN 123.8 09/27/2019     Attestation Statements:   Reviewed by clinician on day of visit: allergies, medications, problem list, medical history, surgical history, family history, social history, and previous encounter notes.  Time spent on visit including pre-visit chart review and post-visit care and charting was 15 minutes.   I, Para March, am acting as  transcriptionist for [name], MD.  I have reviewed the above documentation for accuracy and completeness, and I agree with the above. - Jinny Blossom, MD

## 2020-04-12 DIAGNOSIS — G4733 Obstructive sleep apnea (adult) (pediatric): Secondary | ICD-10-CM | POA: Diagnosis not present

## 2020-04-25 DIAGNOSIS — G8929 Other chronic pain: Secondary | ICD-10-CM | POA: Diagnosis not present

## 2020-04-25 DIAGNOSIS — E1165 Type 2 diabetes mellitus with hyperglycemia: Secondary | ICD-10-CM | POA: Diagnosis not present

## 2020-04-25 DIAGNOSIS — I10 Essential (primary) hypertension: Secondary | ICD-10-CM | POA: Diagnosis not present

## 2020-04-25 DIAGNOSIS — E1169 Type 2 diabetes mellitus with other specified complication: Secondary | ICD-10-CM | POA: Diagnosis not present

## 2020-04-25 DIAGNOSIS — E785 Hyperlipidemia, unspecified: Secondary | ICD-10-CM | POA: Diagnosis not present

## 2020-04-25 DIAGNOSIS — E119 Type 2 diabetes mellitus without complications: Secondary | ICD-10-CM | POA: Diagnosis not present

## 2020-04-25 DIAGNOSIS — E1142 Type 2 diabetes mellitus with diabetic polyneuropathy: Secondary | ICD-10-CM | POA: Diagnosis not present

## 2020-04-28 ENCOUNTER — Ambulatory Visit (INDEPENDENT_AMBULATORY_CARE_PROVIDER_SITE_OTHER): Payer: Medicare Other | Admitting: Family Medicine

## 2020-04-28 ENCOUNTER — Other Ambulatory Visit: Payer: Self-pay

## 2020-04-28 ENCOUNTER — Encounter (INDEPENDENT_AMBULATORY_CARE_PROVIDER_SITE_OTHER): Payer: Self-pay | Admitting: Family Medicine

## 2020-04-28 VITALS — BP 148/74 | HR 89 | Temp 97.9°F | Ht 70.0 in | Wt 231.0 lb

## 2020-04-28 DIAGNOSIS — E1165 Type 2 diabetes mellitus with hyperglycemia: Secondary | ICD-10-CM

## 2020-04-28 DIAGNOSIS — E559 Vitamin D deficiency, unspecified: Secondary | ICD-10-CM | POA: Diagnosis not present

## 2020-04-28 DIAGNOSIS — E669 Obesity, unspecified: Secondary | ICD-10-CM | POA: Diagnosis not present

## 2020-04-28 DIAGNOSIS — Z6833 Body mass index (BMI) 33.0-33.9, adult: Secondary | ICD-10-CM

## 2020-04-28 DIAGNOSIS — Z794 Long term (current) use of insulin: Secondary | ICD-10-CM

## 2020-04-29 NOTE — Progress Notes (Signed)
Chief Complaint:   OBESITY Max [name] is here to discuss his progress with his obesity treatment plan along with follow-up of his obesity related diagnoses. Max [name] is on the Category 3 Plan and states he is following his eating plan approximately 80% of the time. Max [name] states he is walking the dog for 40 minutes 7 times per week.  Today's visit was #: 12 Starting weight: 273 lbs Starting date: 09/28/2017 Today's weight: 231 lbs Today's date: 04/28/2020 Total lbs lost to date: 42 Total lbs lost since last in-office visit: 0  Interim History: Max [name] voices the last few weeks he has been trying to stick to the spirit of the meal plan. He ran out of Jardiance over the last few weeks. He does report he did eat more indulgently over the last few weeks with the snow day and eating out. He notes indulgent foods are no longer in the house. He has some birthdays for his children coming up.  Subjective:   1. Type 2 diabetes mellitus with hyperglycemia, with long-term current use of insulin (HCC) Max [name]'s blood sugar range in 200's (high of 372). He messaged Dr. Buddy Duty for refill Max [name] and is awaiting a reply. He has contemplated restarting insulin (taking 5-10 units if his blood sugars is elevated).  2. Vitamin D deficiency Max [name] is on 1,000 IU daily Vit D. He notes fatigue and he is not on prescription Vit D. Last Vit D level was 60.1.  Assessment/Plan:   1. Type 2 diabetes mellitus with hyperglycemia, with long-term current use of insulin (HCC) Good blood sugar control is important to decrease the likelihood of diabetic complications such as nephropathy, neuropathy, limb loss, blindness, coronary artery disease, and death. Intensive lifestyle modification including diet, exercise and weight loss are the first line of treatment for diabetes. We will follow up at Garret's next appointment.  2. Vitamin D deficiency Low Vitamin D level contributes to fatigue and are associated with  obesity, breast, and colon cancer. Max [name] agreed to continue taking OTC Vitamin D and will follow-up for routine testing of Vitamin D, at least 2-3 times per year to avoid over-replacement.  3. Class 1 obesity with serious comorbidity and body mass index (BMI) of 33.0 to 33.9 in adult, unspecified obesity type Max [name] is currently in the action stage of change. As such, his goal is to continue with weight loss efforts. He has agreed to the Category 3 Plan.   Exercise goals: No exercise has been prescribed at this time.  Behavioral modification strategies: increasing lean protein intake, increasing vegetables, meal planning and cooking strategies and keeping healthy foods in the home.  Max [name] has agreed to follow-up with our clinic in 4 weeks. He was informed of the importance of frequent follow-up visits to maximize his success with intensive lifestyle modifications for his multiple health conditions.   Objective:   Blood pressure (!) 148/74, pulse 89, temperature 97.9 F (36.6 C), temperature source Oral, height 5\' 10"  (1.778 m), weight 231 lb (104.8 kg), SpO2 98 %. Body mass index is 33.15 kg/m.  General: Cooperative, alert, well developed, in no acute distress. HEENT: Conjunctivae and lids unremarkable. Cardiovascular: Regular rhythm.  Lungs: Normal work of breathing. Neurologic: No focal deficits.   Lab Results  Component Value Date   CREATININE 0.78 01/03/2020   BUN 11 01/03/2020   NA 143 01/03/2020   K 4.4 01/03/2020   CL 102 01/03/2020   CO2 27 01/03/2020   Lab Results  Component Value Date   ALT  18 01/03/2020   AST 13 01/03/2020   ALKPHOS 99 01/03/2020   BILITOT 0.4 01/03/2020   Lab Results  Component Value Date   HGBA1C 7.9 (H) 01/03/2020   HGBA1C 7.6 (H) 12/11/2018   HGBA1C 7.7 (H) 05/15/2018   HGBA1C 8.3 (H) 09/28/2017   HGBA1C 7.2 (H) 03/09/2016   Lab Results  Component Value Date   INSULIN 8.1 01/03/2020   Lab Results  Component Value Date   TSH  0.471 09/28/2017   Lab Results  Component Value Date   CHOL 171 01/03/2020   HDL 51 01/03/2020   LDLCALC 103 (H) 01/03/2020   TRIG 90 01/03/2020   CHOLHDL 3.8 Ratio 01/09/2008   Lab Results  Component Value Date   WBC 12.0 (H) 09/27/2019   HGB 14.3 09/27/2019   HCT 43.7 09/27/2019   MCV 79.4 09/27/2019   PLT 251.0 09/27/2019   Lab Results  Component Value Date   IRON 65 09/27/2019   TIBC 309 09/27/2019   FERRITIN 123.8 09/27/2019   Attestation Statements:   Reviewed by clinician on day of visit: allergies, medications, problem list, medical history, surgical history, family history, social history, and previous encounter notes.  Time spent on visit including pre-visit chart review and post-visit care and charting was 15 minutes.    I, Max [name], am acting as transcriptionist for [name], MD.  I have reviewed the above documentation for accuracy and completeness, and I agree with the above. - Jinny Blossom, MD

## 2020-05-05 DIAGNOSIS — Z794 Long term (current) use of insulin: Secondary | ICD-10-CM | POA: Diagnosis not present

## 2020-05-05 DIAGNOSIS — E119 Type 2 diabetes mellitus without complications: Secondary | ICD-10-CM | POA: Diagnosis not present

## 2020-05-07 DIAGNOSIS — G8929 Other chronic pain: Secondary | ICD-10-CM | POA: Diagnosis not present

## 2020-05-07 DIAGNOSIS — E119 Type 2 diabetes mellitus without complications: Secondary | ICD-10-CM | POA: Diagnosis not present

## 2020-05-07 DIAGNOSIS — E785 Hyperlipidemia, unspecified: Secondary | ICD-10-CM | POA: Diagnosis not present

## 2020-05-07 DIAGNOSIS — E1165 Type 2 diabetes mellitus with hyperglycemia: Secondary | ICD-10-CM | POA: Diagnosis not present

## 2020-05-07 DIAGNOSIS — E1169 Type 2 diabetes mellitus with other specified complication: Secondary | ICD-10-CM | POA: Diagnosis not present

## 2020-05-07 DIAGNOSIS — I10 Essential (primary) hypertension: Secondary | ICD-10-CM | POA: Diagnosis not present

## 2020-05-07 DIAGNOSIS — E1142 Type 2 diabetes mellitus with diabetic polyneuropathy: Secondary | ICD-10-CM | POA: Diagnosis not present

## 2020-05-26 ENCOUNTER — Other Ambulatory Visit: Payer: Self-pay

## 2020-05-26 ENCOUNTER — Telehealth (INDEPENDENT_AMBULATORY_CARE_PROVIDER_SITE_OTHER): Payer: Medicare Other | Admitting: Family Medicine

## 2020-05-26 ENCOUNTER — Encounter (INDEPENDENT_AMBULATORY_CARE_PROVIDER_SITE_OTHER): Payer: Self-pay | Admitting: Family Medicine

## 2020-05-26 DIAGNOSIS — E1165 Type 2 diabetes mellitus with hyperglycemia: Secondary | ICD-10-CM

## 2020-05-26 DIAGNOSIS — E669 Obesity, unspecified: Secondary | ICD-10-CM

## 2020-05-26 DIAGNOSIS — Z6833 Body mass index (BMI) 33.0-33.9, adult: Secondary | ICD-10-CM

## 2020-05-26 DIAGNOSIS — Z794 Long term (current) use of insulin: Secondary | ICD-10-CM

## 2020-05-26 DIAGNOSIS — E1159 Type 2 diabetes mellitus with other circulatory complications: Secondary | ICD-10-CM

## 2020-05-26 DIAGNOSIS — I152 Hypertension secondary to endocrine disorders: Secondary | ICD-10-CM

## 2020-05-27 NOTE — Progress Notes (Signed)
TeleHealth Visit:  Due to the COVID-19 pandemic, this visit was completed with telemedicine (audio/video) technology to reduce patient and provider exposure as well as to preserve personal protective equipment.   [name] has verbally consented to this TeleHealth visit. The patient is located at home, the provider is located at the [facility] office. The participants in this visit include the listed provider and patient. The visit was conducted today via telephone.   Max [name] was unable to use realtime audiovisual technology today and the telehealth visit was conducted via telephone.  Chief Complaint: OBESITY Max [name] is here to discuss his progress with his obesity treatment plan along with follow-up of his obesity related diagnoses. Max [name] is on the Category 3 Plan and states he is following his eating plan approximately 70% of the time. Max [name] states he is walking 20 minutes 7 times per week.  Today's visit was #: 58 Starting weight: 273 lbs Starting date: 09/28/2017  Interim History: Max [name] is eating some cookies and has noticed some increase in drive for indulgences. He has been walking more with wether getting warmer. He finds that he tends to eat less as the weather arms up. Pt's wife's birthday is coming up, but he does not intend on eating off plan at that time.  Subjective:   1. Type 2 diabetes mellitus with hyperglycemia, with long-term current use of insulin (HCC) Max [name] is very rarely taking insulin. He only takes it if he has been more indulgent with eating. Pt reports blood sugars have been all over. He has gotten more compliant in taking meds.  2. Hypertension associated with diabetes ([place]) Pt's BP is well controlled. He denies chest pain, chest pressure and headache.  BP Readings from Last 3 Encounters:  04/28/20 (!) 148/74  04/07/20 (!) 147/78  03/17/20 (!) 152/78    Assessment/Plan:   1. Type 2 diabetes mellitus with hyperglycemia, with  long-term current use of insulin (HCC) Good blood sugar control is important to decrease the likelihood of diabetic complications such as nephropathy, neuropathy, limb loss, blindness, coronary artery disease, and death. Intensive lifestyle modification including diet, exercise and weight loss are the first line of treatment for diabetes. Follow up with Dr. Buddy Duty tomorrow.  2. Hypertension associated with diabetes ([place]) Max [name] is working on healthy weight loss and exercise to improve blood pressure control. We will watch for signs of hypotension as he continues his lifestyle modifications. Continue current treatment plan with no change in dosage.  3. Class 1 obesity with serious comorbidity and body mass index (BMI) of 33.0 to 33.9 in adult, unspecified obesity type Oral is currently in the action stage of change. As such, his goal is to continue with weight loss efforts. He has agreed to the Category 3 Plan.   Exercise goals: All adults should avoid inactivity. Some physical activity is better than none, and adults who participate in any amount of physical activity gain some health benefits.  Behavioral modification strategies: increasing lean protein intake, meal planning and cooking strategies and celebration eating strategies.  [name] has agreed to follow-up with our clinic in 2 weeks. He was informed of the importance of frequent follow-up visits to maximize his success with intensive lifestyle modifications for his multiple health conditions.  Objective:   VITALS: Per patient if applicable, see vitals. GENERAL: Alert and in no acute distress. CARDIOPULMONARY: No increased WOB. Speaking in clear sentences.  PSYCH: Pleasant and cooperative. Speech normal rate and rhythm. Affect is appropriate. Insight and judgement are appropriate.  Attention is focused, linear, and appropriate.  NEURO: Oriented as arrived to appointment on time with no prompting.   Lab Results  Component Value Date    CREATININE 0.78 01/03/2020   BUN 11 01/03/2020   NA 143 01/03/2020   K 4.4 01/03/2020   CL 102 01/03/2020   CO2 27 01/03/2020   Lab Results  Component Value Date   ALT 18 01/03/2020   AST 13 01/03/2020   ALKPHOS 99 01/03/2020   BILITOT 0.4 01/03/2020   Lab Results  Component Value Date   HGBA1C 7.9 (H) 01/03/2020   HGBA1C 7.6 (H) 12/11/2018   HGBA1C 7.7 (H) 05/15/2018   HGBA1C 8.3 (H) 09/28/2017   HGBA1C 7.2 (H) 03/09/2016   Lab Results  Component Value Date   INSULIN 8.1 01/03/2020   Lab Results  Component Value Date   TSH 0.471 09/28/2017   Lab Results  Component Value Date   CHOL 171 01/03/2020   HDL 51 01/03/2020   LDLCALC 103 (H) 01/03/2020   TRIG 90 01/03/2020   CHOLHDL 3.8 Ratio 01/09/2008   Lab Results  Component Value Date   WBC 12.0 (H) 09/27/2019   HGB 14.3 09/27/2019   HCT 43.7 09/27/2019   MCV 79.4 09/27/2019   PLT 251.0 09/27/2019   Lab Results  Component Value Date   IRON 65 09/27/2019   TIBC 309 09/27/2019   FERRITIN 123.8 09/27/2019    Attestation Statements:   Reviewed by clinician on day of visit: allergies, medications, problem list, medical history, surgical history, family history, social history, and previous encounter notes.  Coral Ceo, am acting as transcriptionist for [name], MD.   I have reviewed the above documentation for accuracy and completeness, and I agree with the above. - Jinny Blossom, MD

## 2020-05-28 DIAGNOSIS — E1169 Type 2 diabetes mellitus with other specified complication: Secondary | ICD-10-CM | POA: Diagnosis not present

## 2020-05-28 DIAGNOSIS — E1142 Type 2 diabetes mellitus with diabetic polyneuropathy: Secondary | ICD-10-CM | POA: Diagnosis not present

## 2020-06-02 ENCOUNTER — Telehealth: Payer: Self-pay | Admitting: *Deleted

## 2020-06-02 DIAGNOSIS — E119 Type 2 diabetes mellitus without complications: Secondary | ICD-10-CM | POA: Diagnosis not present

## 2020-06-02 DIAGNOSIS — Z794 Long term (current) use of insulin: Secondary | ICD-10-CM | POA: Diagnosis not present

## 2020-06-02 NOTE — Telephone Encounter (Signed)
GI Records received , on Dr Jillyn Hidden desk for review

## 2020-06-10 ENCOUNTER — Encounter (INDEPENDENT_AMBULATORY_CARE_PROVIDER_SITE_OTHER): Payer: Self-pay | Admitting: Family Medicine

## 2020-06-10 ENCOUNTER — Ambulatory Visit (INDEPENDENT_AMBULATORY_CARE_PROVIDER_SITE_OTHER): Payer: Medicare Other | Admitting: Family Medicine

## 2020-06-10 ENCOUNTER — Other Ambulatory Visit: Payer: Self-pay

## 2020-06-10 VITALS — BP 127/80 | HR 77 | Temp 97.4°F | Ht 70.0 in | Wt 228.0 lb

## 2020-06-10 DIAGNOSIS — E1159 Type 2 diabetes mellitus with other circulatory complications: Secondary | ICD-10-CM

## 2020-06-10 DIAGNOSIS — E66811 Obesity, class 1: Secondary | ICD-10-CM

## 2020-06-10 DIAGNOSIS — E1165 Type 2 diabetes mellitus with hyperglycemia: Secondary | ICD-10-CM

## 2020-06-10 DIAGNOSIS — E669 Obesity, unspecified: Secondary | ICD-10-CM

## 2020-06-10 DIAGNOSIS — I152 Hypertension secondary to endocrine disorders: Secondary | ICD-10-CM | POA: Diagnosis not present

## 2020-06-10 DIAGNOSIS — Z794 Long term (current) use of insulin: Secondary | ICD-10-CM

## 2020-06-10 DIAGNOSIS — Z6832 Body mass index (BMI) 32.0-32.9, adult: Secondary | ICD-10-CM

## 2020-06-10 MED ORDER — TRULICITY 1.5 MG/0.5ML ~~LOC~~ SOAJ: 4.5000 mg | SUBCUTANEOUS | Status: DC

## 2020-06-16 NOTE — Progress Notes (Signed)
Chief Complaint:   OBESITY Max [name] [name] is here to discuss his progress with his obesity treatment plan along with follow-up of his obesity related diagnoses. Max [name] [name] is on the Category 3 Plan and states he is following his eating plan approximately 70-80% of the time. Max [name] [name] states he is walking 1/2-1 mile 7 times per week.  Today's visit was #: 51 Starting weight: 273 lbs Starting date: 09/28/2017 Today's weight: 228 lbs Today's date: 06/10/2020 Total lbs lost to date: 45 lbs Total lbs lost since last in-office visit: 3 lbs  Interim History: Max [name] [name] has been walking more with improvement in weather. He is sticking to the spirit of the meal plan. Breakfast- yogurt; doing baked fish and baked chicken for protein. He is occasionally doing baked noodles but in a controlled manner. He is doing smoothies made by his wife and vegetable juices. He is making plans for the upcoming few weeks.  Subjective:   1. Hypertension associated with diabetes ([place]) Max [name] [name]'s BP is well controlled today.  He denies chest pain, chest pressure and headache. He is on Norvasc and Cozaar.  BP Readings from Last 3 Encounters:  06/10/20 127/80  04/28/20 (!) 148/74  04/07/20 (!) 147/78    2. Type 2 diabetes mellitus with hyperglycemia, with long-term current use of insulin (HCC) Max [name] of 20 units for the week. Max [name] [name] recently increased [name] to 4.5 mg. His A1c was 8.6 on 05/28/2020. He has had 1-2 episodes of blood sugars in the 80's, otherwise they run 100-200.  Lab Results  Component Value Date   HGBA1C 7.9 (H) 01/03/2020   HGBA1C 7.6 (H) 12/11/2018   HGBA1C 7.7 (H) 05/15/2018   Lab Results  Component Value Date   MICROALBUR 0.20 01/09/2008   LDLCALC 103 (H) 01/03/2020   CREATININE 0.78 01/03/2020   Lab Results  Component Value Date   INSULIN 8.1 01/03/2020    Assessment/Plan:   1. Hypertension associated with diabetes ([place]) Max [name] [name] is working on healthy weight loss and exercise to improve  blood pressure control. We will watch for signs of hypotension as he continues his lifestyle modifications. Continue current meds with no change in dose.  2. Type 2 diabetes mellitus with hyperglycemia, with long-term current use of insulin (HCC) Good blood sugar control is important to decrease the likelihood of diabetic complications such as nephropathy, neuropathy, limb loss, blindness, coronary artery disease, and death. Intensive lifestyle modification including diet, exercise and weight loss are the first line of treatment for diabetes. Continue current meds. Follow up with Dr. Buddy Duty for further management. Goal is A1c <7.0 and to be off insulin. - [name] 1.5 NA/3.5TD SOPN; Inject 4.5 mg into the skin once a week.  3. Class 1 obesity with serious comorbidity and body mass index (BMI) of 32.0 to 32.9 in adult, unspecified obesity type Max [name] [name] is currently in the action stage of change. As such, his goal is to continue with weight loss efforts. He has agreed to the Category 3 Plan.   Exercise goals: As is  Behavioral modification strategies: increasing lean protein intake, decreasing eating out, meal planning and cooking strategies and planning for success.  Max [name] [name] has agreed to follow-up with our clinic in 4 weeks. He was informed of the importance of frequent follow-up visits to maximize his success with intensive lifestyle modifications for his multiple health conditions.   Objective:   Blood pressure 127/80, pulse 77, temperature (!) 97.4 F (36.3 C), temperature source Oral, height 5\' 10"  (1.778 m), weight 228 lb (103.4 kg),  SpO2 98 %. Body mass index is 32.71 kg/m.  General: Cooperative, alert, well developed, in no acute distress. HEENT: Conjunctivae and lids unremarkable. Cardiovascular: Regular rhythm.  Lungs: Normal work of breathing. Neurologic: No focal deficits.   Lab Results  Component Value Date   CREATININE 0.78 01/03/2020   BUN 11 01/03/2020   NA 143 01/03/2020    K 4.4 01/03/2020   CL 102 01/03/2020   CO2 27 01/03/2020   Lab Results  Component Value Date   ALT 18 01/03/2020   AST 13 01/03/2020   ALKPHOS 99 01/03/2020   BILITOT 0.4 01/03/2020   Lab Results  Component Value Date   HGBA1C 7.9 (H) 01/03/2020   HGBA1C 7.6 (H) 12/11/2018   HGBA1C 7.7 (H) 05/15/2018   HGBA1C 8.3 (H) 09/28/2017   HGBA1C 7.2 (H) 03/09/2016   Lab Results  Component Value Date   INSULIN 8.1 01/03/2020   Lab Results  Component Value Date   TSH 0.471 09/28/2017   Lab Results  Component Value Date   CHOL 171 01/03/2020   HDL 51 01/03/2020   LDLCALC 103 (H) 01/03/2020   TRIG 90 01/03/2020   CHOLHDL 3.8 Ratio 01/09/2008   Lab Results  Component Value Date   WBC 12.0 (H) 09/27/2019   HGB 14.3 09/27/2019   HCT 43.7 09/27/2019   MCV 79.4 09/27/2019   PLT 251.0 09/27/2019   Lab Results  Component Value Date   IRON 65 09/27/2019   TIBC 309 09/27/2019   FERRITIN 123.8 09/27/2019     Attestation Statements:   Reviewed by clinician on day of visit: allergies, medications, problem list, medical history, surgical history, family history, social history, and previous encounter notes.  Time spent on visit including pre-visit chart review and post-visit care and charting was 15 minutes.   Coral Ceo, am acting as transcriptionist for [name], MD.   I have reviewed the above documentation for accuracy and completeness, and I agree with the above. - Jinny Blossom, MD

## 2020-07-09 DIAGNOSIS — E119 Type 2 diabetes mellitus without complications: Secondary | ICD-10-CM | POA: Diagnosis not present

## 2020-07-09 DIAGNOSIS — Z794 Long term (current) use of insulin: Secondary | ICD-10-CM | POA: Diagnosis not present

## 2020-07-14 ENCOUNTER — Encounter (INDEPENDENT_AMBULATORY_CARE_PROVIDER_SITE_OTHER): Payer: Self-pay | Admitting: Family Medicine

## 2020-07-14 ENCOUNTER — Ambulatory Visit (INDEPENDENT_AMBULATORY_CARE_PROVIDER_SITE_OTHER): Payer: Medicare Other | Admitting: Family Medicine

## 2020-07-14 ENCOUNTER — Other Ambulatory Visit: Payer: Self-pay

## 2020-07-14 VITALS — BP 107/68 | HR 87 | Temp 97.9°F | Ht 70.0 in | Wt 220.0 lb

## 2020-07-14 DIAGNOSIS — Z6841 Body Mass Index (BMI) 40.0 and over, adult: Secondary | ICD-10-CM

## 2020-07-14 DIAGNOSIS — R0602 Shortness of breath: Secondary | ICD-10-CM | POA: Diagnosis not present

## 2020-07-14 DIAGNOSIS — E1169 Type 2 diabetes mellitus with other specified complication: Secondary | ICD-10-CM

## 2020-07-15 NOTE — Progress Notes (Signed)
Chief Complaint:   OBESITY Max [name] is here to discuss his progress with his obesity treatment plan along with follow-up of his obesity related diagnoses. Max [name] is on the Category 3 Plan Max states he is following his eating plan approximately 75% of the time. Max [name] states he is walking 15-20 minutes 4 times per week.  Today's visit was #: 64 Starting weight: 273 lbs Starting date: 09/28/2017 Today's weight: 220 lbs Today's date: 07/14/2020 Total lbs lost to date: 53 lbs Total lbs lost since last in-office visit: 8  Interim History: Max [name] has had a really Max last few weeks. He has been trying to follow meal plan as strictly as he can. He is hungry currently but not eating as much. He doesn't feel very hungry. He has really noticed the spirit of meal plan is a constant in his life.  Subjective:   1. Shortness of breath Max [name]'s last IC was in 2019 with an IC of 1952. IC today of 1708.  2. Type 2 diabetes mellitus with other specified complication, without long-term current use of insulin (HCC) Max [name]'s highest BS 309. No hypoglycemia. He is on [place]. Pt denies side effects of Jardiance.   Assessment/Plan:   1. Shortness of breath Max [name] does feel that he gets out of breath more easily that he used to when he exercises. Max [name]'s shortness of breath appears to be obesity related Max exercise induced. He has agreed to work on weight [name] Max gradually increase exercise to treat his exercise induced shortness of breath. Will continue to monitor closely. IC today. Continue category 3 plan.  2. Type 2 diabetes mellitus with other specified complication, without long-term current use of insulin (HCC) Max [name], Max [name], Max [name], Max [name], Max [name], Max death. Intensive lifestyle modification including diet, exercise Max weight [name] are the  first line of treatment for diabetes. Continue current meds with no change in dose.  3. Class 3 severe obesity with serious comorbidity Max body mass index (BMI) of 40.0 to 44.9 in adult, unspecified obesity type Max [name]) Max [name] is currently in the action stage of change. As such, his goal is to continue with weight [name] efforts. He has agreed to the Category 3 Plan.   Exercise goals: As is  Behavioral modification strategies: increasing lean protein intake, meal planning Max cooking strategies Max keeping healthy foods in the home.  Max [name] has agreed to follow-up with our clinic in 4 weeks. He was informed of the importance of frequent follow-up visits to maximize his success with intensive lifestyle modifications for his multiple health conditions.   Objective:   Blood pressure 107/68, pulse 87, temperature 97.9 F (36.6 C), height 5\' 10"  (1.778 m), weight 220 lb (99.8 kg), SpO2 98 %. Body mass index is 31.57 kg/m.  General: Cooperative, alert, well developed, in no acute distress. HEENT: Conjunctivae Max lids unremarkable. Cardiovascular: Regular rhythm.  Lungs: Normal work of breathing. Neurologic: No focal deficits.   Lab Results  Component Value Date   CREATININE 0.78 01/03/2020   BUN 11 01/03/2020   NA 143 01/03/2020   K 4.4 01/03/2020   CL 102 01/03/2020   CO2 27 01/03/2020   Lab Results  Component Value Date   ALT 18 01/03/2020   AST 13 01/03/2020   ALKPHOS 99 01/03/2020   BILITOT 0.4 01/03/2020   Lab Results  Component Value Date   HGBA1C 7.9 (H) 01/03/2020  HGBA1C 7.6 (H) 12/11/2018   HGBA1C 7.7 (H) 05/15/2018   HGBA1C 8.3 (H) 09/28/2017   HGBA1C 7.2 (H) 03/09/2016   Lab Results  Component Value Date   INSULIN 8.1 01/03/2020   Lab Results  Component Value Date   TSH 0.471 09/28/2017   Lab Results  Component Value Date   CHOL 171 01/03/2020   HDL 51 01/03/2020   LDLCALC 103 (H) 01/03/2020   TRIG 90 01/03/2020   CHOLHDL 3.8 Ratio 01/09/2008    Lab Results  Component Value Date   WBC 12.0 (H) 09/27/2019   HGB 14.3 09/27/2019   HCT 43.7 09/27/2019   MCV 79.4 09/27/2019   PLT 251.0 09/27/2019   Lab Results  Component Value Date   IRON 65 09/27/2019   TIBC 309 09/27/2019   FERRITIN 123.8 09/27/2019    Obesity Behavioral Intervention:   Approximately 15 minutes were spent on the discussion below.  ASK: We discussed the diagnosis of obesity with Max [name] today Max Max [name] agreed to give [name] permission to discuss obesity behavioral modification therapy today.  ASSESS: Creg has the diagnosis of obesity Max his BMI today is 31.7. Isreal is in the action stage of change.   ADVISE: Hadriel was educated on the multiple health risks of obesity as well as the benefit of weight [name] to improve his health. He was advised of the need for long term treatment Max the importance of lifestyle modifications to improve his current health Max to decrease his risk of future health problems.  AGREE: Multiple dietary modification options Max treatment options were discussed Max Kalijah agreed to follow the recommendations documented in the above note.  ARRANGE: Jakub was educated on the importance of frequent visits to treat obesity as outlined per CMS Max USPSTF guidelines Max agreed to schedule his next follow up appointment today.  Attestation Statements:   Reviewed by clinician on day of visit: allergies, medications, problem list, medical history, surgical history, family history, social history, Max previous encounter notes.  Coral Ceo, am acting as transcriptionist for [name], MD.  I have reviewed the above documentation for accuracy Max completeness, Max I agree with the above. - Jinny Blossom, MD

## 2020-07-17 DIAGNOSIS — G4733 Obstructive sleep apnea (adult) (pediatric): Secondary | ICD-10-CM | POA: Diagnosis not present

## 2020-08-08 DIAGNOSIS — E119 Type 2 diabetes mellitus without complications: Secondary | ICD-10-CM | POA: Diagnosis not present

## 2020-08-08 DIAGNOSIS — Z794 Long term (current) use of insulin: Secondary | ICD-10-CM | POA: Diagnosis not present

## 2020-08-11 ENCOUNTER — Ambulatory Visit (INDEPENDENT_AMBULATORY_CARE_PROVIDER_SITE_OTHER): Payer: Medicare Other | Admitting: Family Medicine

## 2020-08-11 ENCOUNTER — Encounter (INDEPENDENT_AMBULATORY_CARE_PROVIDER_SITE_OTHER): Payer: Self-pay | Admitting: Family Medicine

## 2020-08-11 ENCOUNTER — Other Ambulatory Visit: Payer: Self-pay

## 2020-08-11 VITALS — BP 158/67 | HR 69 | Temp 98.0°F | Ht 70.0 in | Wt 225.0 lb

## 2020-08-11 DIAGNOSIS — I152 Hypertension secondary to endocrine disorders: Secondary | ICD-10-CM

## 2020-08-11 DIAGNOSIS — E1159 Type 2 diabetes mellitus with other circulatory complications: Secondary | ICD-10-CM

## 2020-08-11 DIAGNOSIS — Z6841 Body Mass Index (BMI) 40.0 and over, adult: Secondary | ICD-10-CM

## 2020-08-11 DIAGNOSIS — E1169 Type 2 diabetes mellitus with other specified complication: Secondary | ICD-10-CM | POA: Diagnosis not present

## 2020-08-12 NOTE — Progress Notes (Signed)
Chief Complaint:   OBESITY Max [name] is here to discuss his progress with his obesity treatment plan along with follow-up of his obesity related diagnoses. Max [name] is on the Category 3 Plan and states he is following his eating plan approximately 70% of the time. Max [name] states he is walking 15 minutes 7 times per week.  Today's visit was #: 26 Starting weight: 273 lbs Starting date: 09/28/2017 Today's weight: 225 lbs Today's date: 08/11/2020 Total lbs lost to date: 48 lbs Total lbs lost since last in-office visit: 0  Interim History: Pt has been eating more ice cream because grandkids were around the last 2 weekends. He did indulge more when grandkids were around. Next 2 weeks pt continues to walk the dog but will be going to [facility] for granddaughter's graduation and dance recital. He hasn't gotten back in the pool yet.  Subjective:   1. Type 2 diabetes mellitus with other specified complication, without long-term current use of insulin (HCC) Pt has not been as compliant as he would have liked on plan. He only had one BS in 300's. Low of 75. He is on [name] and [name].  2. Hypertension associated with diabetes ([place]) BP slightly elevated today. Pt denies chest pain/chest pressure/headache. He is in significant pain today and he hasn't taken any medications today.  Assessment/Plan:   1. Type 2 diabetes mellitus with other specified complication, without long-term current use of insulin (HCC) Good blood sugar control is important to decrease the likelihood of diabetic complications such as nephropathy, neuropathy, limb loss, blindness, coronary artery disease, and death. Intensive lifestyle modification including diet, exercise and weight loss are the first line of treatment for diabetes.  -Follow up with Dr. Buddy Duty.  -Increase compliance to plan and f/u on BS.  2. Hypertension associated with diabetes ([place]) Max [name] is working on healthy weight loss and exercise to improve blood  pressure control. We will watch for signs of hypotension as he continues his lifestyle modifications. -Follow up BP at next appt.  3. Class 3 severe obesity with serious comorbidity and body mass index (BMI) of 40.0 to 44.9 in adult, unspecified obesity type [facility])  Max [name] is currently in the action stage of change. As such, his goal is to continue with weight loss efforts. He has agreed to the Category 3 Plan.   Exercise goals: Pt is to start going to [facility] to swim.  Behavioral modification strategies: increasing lean protein intake, meal planning and cooking strategies, keeping healthy foods in the home and planning for success.  Max [name] has agreed to follow-up with our clinic in 5 weeks. He was informed of the importance of frequent follow-up visits to maximize his success with intensive lifestyle modifications for his multiple health conditions.   Objective:   Blood pressure (!) 158/67, pulse 69, temperature 98 F (36.7 C), height 5\' 10"  (1.778 m), weight 225 lb (102.1 kg), SpO2 99 %. Body mass index is 32.28 kg/m.  General: Cooperative, alert, well developed, in no acute distress. HEENT: Conjunctivae and lids unremarkable. Cardiovascular: Regular rhythm.  Lungs: Normal work of breathing. Neurologic: No focal deficits.   Lab Results  Component Value Date   CREATININE 0.78 01/03/2020   BUN 11 01/03/2020   NA 143 01/03/2020   K 4.4 01/03/2020   CL 102 01/03/2020   CO2 27 01/03/2020   Lab Results  Component Value Date   ALT 18 01/03/2020   AST 13 01/03/2020   ALKPHOS 99 01/03/2020   BILITOT 0.4 01/03/2020  Lab Results  Component Value Date   HGBA1C 7.9 (H) 01/03/2020   HGBA1C 7.6 (H) 12/11/2018   HGBA1C 7.7 (H) 05/15/2018   HGBA1C 8.3 (H) 09/28/2017   HGBA1C 7.2 (H) 03/09/2016   Lab Results  Component Value Date   INSULIN 8.1 01/03/2020   Lab Results  Component Value Date   TSH 0.471 09/28/2017   Lab Results  Component Value Date   CHOL 171 01/03/2020    HDL 51 01/03/2020   LDLCALC 103 (H) 01/03/2020   TRIG 90 01/03/2020   CHOLHDL 3.8 Ratio 01/09/2008   Lab Results  Component Value Date   WBC 12.0 (H) 09/27/2019   HGB 14.3 09/27/2019   HCT 43.7 09/27/2019   MCV 79.4 09/27/2019   PLT 251.0 09/27/2019   Lab Results  Component Value Date   IRON 65 09/27/2019   TIBC 309 09/27/2019   FERRITIN 123.8 09/27/2019     Attestation Statements:   Reviewed by clinician on day of visit: allergies, medications, problem list, medical history, surgical history, family history, social history, and previous encounter notes.  Coral Ceo, CMA, am acting as transcriptionist for [name], MD.   I have reviewed the above documentation for accuracy and completeness, and I agree with the above. - Jinny Blossom, MD

## 2020-09-03 DIAGNOSIS — E785 Hyperlipidemia, unspecified: Secondary | ICD-10-CM | POA: Diagnosis not present

## 2020-09-03 DIAGNOSIS — I10 Essential (primary) hypertension: Secondary | ICD-10-CM | POA: Diagnosis not present

## 2020-09-08 DIAGNOSIS — Z794 Long term (current) use of insulin: Secondary | ICD-10-CM | POA: Diagnosis not present

## 2020-09-08 DIAGNOSIS — E119 Type 2 diabetes mellitus without complications: Secondary | ICD-10-CM | POA: Diagnosis not present

## 2020-09-22 ENCOUNTER — Other Ambulatory Visit: Payer: Self-pay

## 2020-09-22 ENCOUNTER — Ambulatory Visit (INDEPENDENT_AMBULATORY_CARE_PROVIDER_SITE_OTHER): Payer: Medicare Other | Admitting: Family Medicine

## 2020-09-22 ENCOUNTER — Encounter (INDEPENDENT_AMBULATORY_CARE_PROVIDER_SITE_OTHER): Payer: Self-pay | Admitting: Family Medicine

## 2020-09-22 VITALS — BP 97/60 | HR 88 | Temp 98.6°F | Ht 70.0 in | Wt 225.0 lb

## 2020-09-22 DIAGNOSIS — G8929 Other chronic pain: Secondary | ICD-10-CM | POA: Diagnosis not present

## 2020-09-22 DIAGNOSIS — M545 Low back pain, unspecified: Secondary | ICD-10-CM | POA: Diagnosis not present

## 2020-09-22 DIAGNOSIS — Z794 Long term (current) use of insulin: Secondary | ICD-10-CM | POA: Diagnosis not present

## 2020-09-22 DIAGNOSIS — E1165 Type 2 diabetes mellitus with hyperglycemia: Secondary | ICD-10-CM

## 2020-09-22 DIAGNOSIS — Z6841 Body Mass Index (BMI) 40.0 and over, adult: Secondary | ICD-10-CM

## 2020-09-23 NOTE — Progress Notes (Signed)
Chief Complaint:   OBESITY Max [name] is here to discuss his progress with his obesity treatment plan along with follow-up of his obesity related diagnoses. Max [name] is on the Category 3 Plan and states he is following his eating plan approximately 45-50% of the time. Max [name] states he is walking 25 minutes 7 times per week.  Today's visit was #: 64 Starting weight: 273 lbs Starting date: 09/28/2017 Today's weight: 225 lbs Today's date: 09/22/2020 Total lbs lost to date: 48 Total lbs lost since last in-office visit: 0  Interim History: Max [name] has been eating and is in control of choices and walking dog daily. He is still in quite a bit of pain. Plan for the 4th of July is to attend street festival. He finds it is more difficult to get protein in during summer. He tried CBD for pain control and anxiety of pain control.  Subjective:   1. Type 2 diabetes mellitus with hyperglycemia, with long-term current use of insulin (HCC) Max [name] is on 4.5 mg [name], Jardiance, Crestor. He sees Dr. Buddy Duty for endocrine. Only occasional insulin use- very infrequent.  2. Chronic low back pain, unspecified back pain laterality, unspecified whether sciatica present Max [name] sees pain management. A new prescription for Tramadol was given.  Assessment/Plan:   1. Type 2 diabetes mellitus with hyperglycemia, with long-term current use of insulin (HCC) Good blood sugar control is important to decrease the likelihood of diabetic complications such as nephropathy, neuropathy, limb loss, blindness, coronary artery disease, and death. Intensive lifestyle modification including diet, exercise and weight loss are the first line of treatment for diabetes. Follow up with Dr. Buddy Duty. Continue current meds.  2. Chronic low back pain, unspecified back pain laterality, unspecified whether sciatica present Continue to follow up with pain management.  3. Class 3 severe obesity with serious comorbidity and body mass index  (BMI) of 40.0 to 44.9 in adult, unspecified obesity type [name])  Max [name] is currently in the action stage of change. As such, his goal is to continue with weight loss efforts. He has agreed to the Category 3 Plan.   Exercise goals:  As is  Behavioral modification strategies: increasing lean protein intake, meal planning and cooking strategies, keeping healthy foods in the home, and planning for success.  Max [name] has agreed to follow-up with our clinic in 6 weeks. He was informed of the importance of frequent follow-up visits to maximize his success with intensive lifestyle modifications for his multiple health conditions.   Objective:   Blood pressure 97/60, pulse 88, temperature 98.6 F (37 C), height 5\' 10"  (1.778 m), weight 225 lb (102.1 kg), SpO2 97 %. Body mass index is 32.28 kg/m.  General: Cooperative, alert, well developed, in no acute distress. HEENT: Conjunctivae and lids unremarkable. Cardiovascular: Regular rhythm.  Lungs: Normal work of breathing. Neurologic: No focal deficits.   Lab Results  Component Value Date   CREATININE 0.78 01/03/2020   BUN 11 01/03/2020   NA 143 01/03/2020   K 4.4 01/03/2020   CL 102 01/03/2020   CO2 27 01/03/2020   Lab Results  Component Value Date   ALT 18 01/03/2020   AST 13 01/03/2020   ALKPHOS 99 01/03/2020   BILITOT 0.4 01/03/2020   Lab Results  Component Value Date   HGBA1C 7.9 (H) 01/03/2020   HGBA1C 7.6 (H) 12/11/2018   HGBA1C 7.7 (H) 05/15/2018   HGBA1C 8.3 (H) 09/28/2017   HGBA1C 7.2 (H) 03/09/2016   Lab Results  Component Value Date  INSULIN 8.1 01/03/2020   Lab Results  Component Value Date   TSH 0.471 09/28/2017   Lab Results  Component Value Date   CHOL 171 01/03/2020   HDL 51 01/03/2020   LDLCALC 103 (H) 01/03/2020   TRIG 90 01/03/2020   CHOLHDL 3.8 Ratio 01/09/2008   Lab Results  Component Value Date   WBC 12.0 (H) 09/27/2019   HGB 14.3 09/27/2019   HCT 43.7 09/27/2019   MCV 79.4 09/27/2019    PLT 251.0 09/27/2019   Lab Results  Component Value Date   IRON 65 09/27/2019   TIBC 309 09/27/2019   FERRITIN 123.8 09/27/2019    Attestation Statements:   Reviewed by clinician on day of visit: allergies, medications, problem list, medical history, surgical history, family history, social history, and previous encounter notes.  Time spent on visit including pre-visit chart review and post-visit care and charting was 13 minutes.   Coral Ceo, CMA, am acting as transcriptionist for [name], MD.   I have reviewed the above documentation for accuracy and completeness, and I agree with the above. - Jinny Blossom, MD

## 2020-10-07 DIAGNOSIS — Z794 Long term (current) use of insulin: Secondary | ICD-10-CM | POA: Diagnosis not present

## 2020-10-07 DIAGNOSIS — E119 Type 2 diabetes mellitus without complications: Secondary | ICD-10-CM | POA: Diagnosis not present

## 2020-10-09 DIAGNOSIS — E1165 Type 2 diabetes mellitus with hyperglycemia: Secondary | ICD-10-CM | POA: Diagnosis not present

## 2020-10-09 DIAGNOSIS — E1142 Type 2 diabetes mellitus with diabetic polyneuropathy: Secondary | ICD-10-CM | POA: Diagnosis not present

## 2020-10-09 DIAGNOSIS — E1169 Type 2 diabetes mellitus with other specified complication: Secondary | ICD-10-CM | POA: Diagnosis not present

## 2020-10-09 DIAGNOSIS — E119 Type 2 diabetes mellitus without complications: Secondary | ICD-10-CM | POA: Diagnosis not present

## 2020-10-09 DIAGNOSIS — G8929 Other chronic pain: Secondary | ICD-10-CM | POA: Diagnosis not present

## 2020-10-09 DIAGNOSIS — I10 Essential (primary) hypertension: Secondary | ICD-10-CM | POA: Diagnosis not present

## 2020-10-15 DIAGNOSIS — G4733 Obstructive sleep apnea (adult) (pediatric): Secondary | ICD-10-CM | POA: Diagnosis not present

## 2020-11-03 ENCOUNTER — Encounter (INDEPENDENT_AMBULATORY_CARE_PROVIDER_SITE_OTHER): Payer: Self-pay | Admitting: Family Medicine

## 2020-11-03 ENCOUNTER — Ambulatory Visit (INDEPENDENT_AMBULATORY_CARE_PROVIDER_SITE_OTHER): Payer: Medicare Other | Admitting: Family Medicine

## 2020-11-03 ENCOUNTER — Other Ambulatory Visit: Payer: Self-pay

## 2020-11-03 VITALS — BP 103/64 | HR 106 | Temp 98.5°F | Ht 70.0 in | Wt 218.0 lb

## 2020-11-03 DIAGNOSIS — Z794 Long term (current) use of insulin: Secondary | ICD-10-CM | POA: Diagnosis not present

## 2020-11-03 DIAGNOSIS — E1165 Type 2 diabetes mellitus with hyperglycemia: Secondary | ICD-10-CM

## 2020-11-03 DIAGNOSIS — R110 Nausea: Secondary | ICD-10-CM | POA: Diagnosis not present

## 2020-11-03 DIAGNOSIS — Z6839 Body mass index (BMI) 39.0-39.9, adult: Secondary | ICD-10-CM

## 2020-11-03 MED ORDER — EMPAGLIFLOZIN 10 MG PO TABS: 10.0000 mg | ORAL_TABLET | Freq: Every day | ORAL | 1 refills | Status: DC

## 2020-11-03 MED ORDER — ONDANSETRON HCL 4 MG PO TABS
4.0000 mg | ORAL_TABLET | Freq: Three times a day (TID) | ORAL | 1 refills | Status: DC | PRN
Start: 2020-11-03 — End: 2022-02-12

## 2020-11-03 MED ORDER — TRULICITY 4.5 MG/0.5ML ~~LOC~~ SOAJ: 4.5000 mg | SUBCUTANEOUS | 0 refills | Status: DC

## 2020-11-03 MED ORDER — TRULICITY 1.5 MG/0.5ML ~~LOC~~ SOAJ: 4.5000 mg | SUBCUTANEOUS | 0 refills | Status: DC

## 2020-11-04 NOTE — Progress Notes (Signed)
Chief Complaint:   OBESITY Max [name] is here to discuss his progress with his obesity treatment plan along with follow-up of his obesity related diagnoses. Max [name] is on the Category 3 Plan and states he is following his eating plan approximately 60% of the time. Max [name] states he is walking the dog 30 minutes 7 times per week.  Today's visit was #: 50 Starting weight: 273 lbs Starting date: 09/28/2017 Today's weight: 218 lbs Today's date: 11/03/2020 Total lbs lost to date: 55 Total lbs lost since last in-office visit: 7  Interim History: Max [name] has been staying in and watching tv. His dog was stolen but he got a new dog. He has been walking his dog since he got him. Pt has been eating quite a bit of ice cream to calm his stomach. He has had fluctuations in intensity of back pain but it is a constant.  Subjective:   1. Nausea Orlyn reports symptoms increase with pain medication and some diabetes mellitus medication.  2. Type 2 diabetes mellitus with hyperglycemia, with long-term current use of insulin (HCC) Pt has had a few low blood sugars in the 40's in August and treated with sugar tabs. He reports increased ice cream consumption. Blood sugar is frequently in the 200's over the last few weeks. He is very rarely needing to [name] insulin.  Assessment/Plan:   1. Nausea We will refill Zofran 4 mg, as prescribed below.  Refill- ondansetron (ZOFRAN) 4 MG tablet; Take 1 tablet (4 mg total) by mouth every 8 (eight) hours as needed for nausea or vomiting.  Dispense: 30 tablet; Refill: 1  2. Type 2 diabetes mellitus with hyperglycemia, with long-term current use of insulin (HCC) Good blood sugar control is important to decrease the likelihood of diabetic complications such as nephropathy, neuropathy, limb loss, blindness, coronary artery disease, and death. Intensive lifestyle modification including diet, exercise and weight loss are the first line of treatment for diabetes.   Refill-  empagliflozin (JARDIANCE) 10 MG TABS tablet; Take 1 tablet (10 mg total) by mouth daily before breakfast.  Dispense: 30 tablet; Refill: 1 Refill- Dulaglutide ([name]) 4.5 0000000 SOPN; Inject 4.5 mg as directed once a week.  Dispense: 2 mL; Refill: 0  3. Obesity with current BMI of 31.3  Max [name] is currently in the action stage of change. As such, his goal is to continue with weight loss efforts. He has agreed to the Category 3 Plan.   Pt is to transition 50% of ice cream consumption to sugar free ice cream and yogurt.  Exercise goals:  Start looking into pool options.  Behavioral modification strategies: increasing lean protein intake, meal planning and cooking strategies, keeping healthy foods in the home, and planning for success.  Max [name] has agreed to follow-up with our clinic in 4-5 weeks. He was informed of the importance of frequent follow-up visits to maximize his success with intensive lifestyle modifications for his multiple health conditions.   Objective:   Blood pressure 103/64, pulse (!) 106, temperature 98.5 F (36.9 C), height '5\' 10"'$  (1.778 m), weight 218 lb (98.9 kg), SpO2 98 %. Body mass index is 31.28 kg/m.  General: Cooperative, alert, well developed, in no acute distress. HEENT: Conjunctivae and lids unremarkable. Cardiovascular: Regular rhythm.  Lungs: Normal work of breathing. Neurologic: No focal deficits.   Lab Results  Component Value Date   CREATININE 0.78 01/03/2020   BUN 11 01/03/2020   NA 143 01/03/2020   K 4.4 01/03/2020   CL 102 01/03/2020  CO2 27 01/03/2020   Lab Results  Component Value Date   ALT 18 01/03/2020   AST 13 01/03/2020   ALKPHOS 99 01/03/2020   BILITOT 0.4 01/03/2020   Lab Results  Component Value Date   HGBA1C 7.9 (H) 01/03/2020   HGBA1C 7.6 (H) 12/11/2018   HGBA1C 7.7 (H) 05/15/2018   HGBA1C 8.3 (H) 09/28/2017   HGBA1C 7.2 (H) 03/09/2016   Lab Results  Component Value Date   INSULIN 8.1 01/03/2020   Lab  Results  Component Value Date   TSH 0.471 09/28/2017   Lab Results  Component Value Date   CHOL 171 01/03/2020   HDL 51 01/03/2020   LDLCALC 103 (H) 01/03/2020   TRIG 90 01/03/2020   CHOLHDL 3.8 Ratio 01/09/2008   Lab Results  Component Value Date   VD25OH 60.1 01/03/2020   VD25OH 50.1 09/28/2017   Lab Results  Component Value Date   WBC 12.0 (H) 09/27/2019   HGB 14.3 09/27/2019   HCT 43.7 09/27/2019   MCV 79.4 09/27/2019   PLT 251.0 09/27/2019   Lab Results  Component Value Date   IRON 65 09/27/2019   TIBC 309 09/27/2019   FERRITIN 123.8 09/27/2019    Obesity Behavioral Intervention:   Approximately 15 minutes were spent on the discussion below.  ASK: We discussed the diagnosis of obesity with [name] today and [name] agreed to give [name] permission to discuss obesity behavioral modification therapy today.  ASSESS: Max [name] has the diagnosis of obesity and his BMI today is 31.3. Max [name] is in the action stage of change.   ADVISE: Max [name] was educated on the multiple health risks of obesity as well as the benefit of weight loss to improve his health. He was advised of the need for long term treatment and the importance of lifestyle modifications to improve his current health and to decrease his risk of future health problems.  AGREE: Multiple dietary modification options and treatment options were discussed and Max [name] agreed to follow the recommendations documented in the above note.  ARRANGE: Max [name] was educated on the importance of frequent visits to treat obesity as outlined per CMS and USPSTF guidelines and agreed to schedule his next follow up appointment today.  Attestation Statements:   Reviewed by clinician on day of visit: allergies, medications, problem list, medical history, surgical history, family history, social history, and previous encounter notes.  Coral Ceo, CMA, am acting as transcriptionist for [name], MD.   I have  reviewed the above documentation for accuracy and completeness, and I agree with the above. - [name], MD

## 2020-11-07 DIAGNOSIS — E119 Type 2 diabetes mellitus without complications: Secondary | ICD-10-CM | POA: Diagnosis not present

## 2020-11-07 DIAGNOSIS — Z794 Long term (current) use of insulin: Secondary | ICD-10-CM | POA: Diagnosis not present

## 2020-12-02 DIAGNOSIS — R110 Nausea: Secondary | ICD-10-CM | POA: Diagnosis not present

## 2020-12-02 DIAGNOSIS — E1169 Type 2 diabetes mellitus with other specified complication: Secondary | ICD-10-CM | POA: Diagnosis not present

## 2020-12-02 DIAGNOSIS — Z7984 Long term (current) use of oral hypoglycemic drugs: Secondary | ICD-10-CM | POA: Diagnosis not present

## 2020-12-02 DIAGNOSIS — E1165 Type 2 diabetes mellitus with hyperglycemia: Secondary | ICD-10-CM | POA: Diagnosis not present

## 2020-12-02 DIAGNOSIS — E1142 Type 2 diabetes mellitus with diabetic polyneuropathy: Secondary | ICD-10-CM | POA: Diagnosis not present

## 2020-12-03 ENCOUNTER — Ambulatory Visit (INDEPENDENT_AMBULATORY_CARE_PROVIDER_SITE_OTHER): Payer: Medicare Other | Admitting: Family Medicine

## 2020-12-03 ENCOUNTER — Encounter (INDEPENDENT_AMBULATORY_CARE_PROVIDER_SITE_OTHER): Payer: Self-pay | Admitting: Family Medicine

## 2020-12-03 ENCOUNTER — Other Ambulatory Visit: Payer: Self-pay

## 2020-12-03 VITALS — BP 103/70 | HR 92 | Temp 98.2°F | Ht 70.0 in | Wt 219.0 lb

## 2020-12-03 DIAGNOSIS — E1165 Type 2 diabetes mellitus with hyperglycemia: Secondary | ICD-10-CM | POA: Diagnosis not present

## 2020-12-03 DIAGNOSIS — I152 Hypertension secondary to endocrine disorders: Secondary | ICD-10-CM | POA: Diagnosis not present

## 2020-12-03 DIAGNOSIS — Z794 Long term (current) use of insulin: Secondary | ICD-10-CM

## 2020-12-03 DIAGNOSIS — Z6839 Body mass index (BMI) 39.0-39.9, adult: Secondary | ICD-10-CM

## 2020-12-03 DIAGNOSIS — E1159 Type 2 diabetes mellitus with other circulatory complications: Secondary | ICD-10-CM | POA: Diagnosis not present

## 2020-12-03 NOTE — Progress Notes (Signed)
Chief Complaint:   OBESITY Max [name] is here to discuss his progress with his obesity treatment plan along with follow-up of his obesity related diagnoses. Max [name] is on the Category 3 Plan and states he is following his eating plan approximately 75% of the time. Max [name] states he is walking 30-40 minutes 4-5 times per week.  Today's visit was #: 73 Starting weight: 273 lbs Starting date: 09/28/2017 Today's weight: 219 lbs Today's date: 12/03/2020 Total lbs lost to date: 58 Total lbs lost since last in-office visit: 0  Interim History: Max [name] took a road trip to see his wife's relative and had a visit from his son. He voices that he has been eating conservatively and trying to stay in the spirit of meal plan. He is trying to be mindful of food choices. Max [name]'s wife reports he may have been eating more ice cream than he realizes.  Subjective:   1. Type 2 diabetes mellitus with hyperglycemia, with long-term current use of insulin (HCC) Brandol is not taking insulin consistently. He is only taking it if he eats high carb meals/foods. BS run 80-373 (not always consistently checking BS).  2. Hypertension associated with diabetes ([place]) BP low normal today. Max [name] denies chest pain/chest pressure/headache.  Assessment/Plan:   1. Type 2 diabetes mellitus with hyperglycemia, with long-term current use of insulin (HCC) Good blood sugar control is important to decrease the likelihood of diabetic complications such as nephropathy, neuropathy, limb loss, blindness, coronary artery disease, and death. Intensive lifestyle modification including diet, exercise and weight loss are the first line of treatment for diabetes. Continue current treatment plan.  2. Hypertension associated with diabetes ([place]) Khysen is working on healthy weight loss and exercise to improve blood pressure control. We will watch for signs of hypotension as he continues his lifestyle modifications. Continue current treatment  plan.  3. Obesity with current BMI of 31.4  Arthar is currently in the action stage of change. As such, his goal is to continue with weight loss efforts. He has agreed to the Category 3 Plan.   Exercise goals: All adults should avoid inactivity. Some physical activity is better than none, and adults who participate in any amount of physical activity gain some health benefits.  Behavioral modification strategies: increasing lean protein intake, decreasing simple carbohydrates, meal planning and cooking strategies, and keeping healthy foods in the home.  Max [name] has agreed to follow-up with our clinic in 6 weeks. He was informed of the importance of frequent follow-up visits to maximize his success with intensive lifestyle modifications for his multiple health conditions.   Objective:   Blood pressure 103/70, pulse 92, temperature 98.2 F (36.8 C), height '5\' 10"'$  (1.778 m), weight 219 lb (99.3 kg), SpO2 97 %. Body mass index is 31.42 kg/m.  General: Cooperative, alert, well developed, in no acute distress. HEENT: Conjunctivae and lids unremarkable. Cardiovascular: Regular rhythm.  Lungs: Normal work of breathing. Neurologic: No focal deficits.   Lab Results  Component Value Date   CREATININE 0.78 01/03/2020   BUN 11 01/03/2020   NA 143 01/03/2020   K 4.4 01/03/2020   CL 102 01/03/2020   CO2 27 01/03/2020   Lab Results  Component Value Date   ALT 18 01/03/2020   AST 13 01/03/2020   ALKPHOS 99 01/03/2020   BILITOT 0.4 01/03/2020   Lab Results  Component Value Date   HGBA1C 7.9 (H) 01/03/2020   HGBA1C 7.6 (H) 12/11/2018   HGBA1C 7.7 (H) 05/15/2018   HGBA1C 8.3 (H)  09/28/2017   HGBA1C 7.2 (H) 03/09/2016   Lab Results  Component Value Date   INSULIN 8.1 01/03/2020   Lab Results  Component Value Date   TSH 0.471 09/28/2017   Lab Results  Component Value Date   CHOL 171 01/03/2020   HDL 51 01/03/2020   LDLCALC 103 (H) 01/03/2020   TRIG 90 01/03/2020   CHOLHDL 3.8  Ratio 01/09/2008   Lab Results  Component Value Date   VD25OH 60.1 01/03/2020   VD25OH 50.1 09/28/2017   Lab Results  Component Value Date   WBC 12.0 (H) 09/27/2019   HGB 14.3 09/27/2019   HCT 43.7 09/27/2019   MCV 79.4 09/27/2019   PLT 251.0 09/27/2019   Lab Results  Component Value Date   IRON 65 09/27/2019   TIBC 309 09/27/2019   FERRITIN 123.8 09/27/2019    Attestation Statements:   Reviewed by clinician on day of visit: allergies, medications, problem list, medical history, surgical history, family history, social history, and previous encounter notes.  Time spent on visit including pre-visit chart review and post-visit care and charting was 14 minutes.   Coral Ceo, CMA, am acting as transcriptionist for [name], MD.   I have reviewed the above documentation for accuracy and completeness, and I agree with the above. - [name], MD

## 2020-12-08 DIAGNOSIS — Z794 Long term (current) use of insulin: Secondary | ICD-10-CM | POA: Diagnosis not present

## 2020-12-08 DIAGNOSIS — E119 Type 2 diabetes mellitus without complications: Secondary | ICD-10-CM | POA: Diagnosis not present

## 2020-12-23 DIAGNOSIS — I10 Essential (primary) hypertension: Secondary | ICD-10-CM | POA: Diagnosis not present

## 2020-12-23 DIAGNOSIS — E1165 Type 2 diabetes mellitus with hyperglycemia: Secondary | ICD-10-CM | POA: Diagnosis not present

## 2020-12-23 DIAGNOSIS — E785 Hyperlipidemia, unspecified: Secondary | ICD-10-CM | POA: Diagnosis not present

## 2020-12-23 DIAGNOSIS — G8929 Other chronic pain: Secondary | ICD-10-CM | POA: Diagnosis not present

## 2020-12-23 DIAGNOSIS — E1169 Type 2 diabetes mellitus with other specified complication: Secondary | ICD-10-CM | POA: Diagnosis not present

## 2020-12-23 DIAGNOSIS — E1142 Type 2 diabetes mellitus with diabetic polyneuropathy: Secondary | ICD-10-CM | POA: Diagnosis not present

## 2020-12-27 NOTE — Progress Notes (Signed)
HPI  male never smoker followed for OSA, complicated by DM, hx DVT, HBP, morbid obesity -------------------   12/27/19- 75 year old male never smoker followed for OSA, Asthma, complicated by DM, hx DVT, HBP, morbid obesity CPAP auto 8-20/ [facility] Download compliance 93%, AHI 5.2/ hr                    Wife here Body weight today- 244 lbs Covid vax- 2 Phizer Flu vax-  Dieting off weight, working with [facility].  ------pt is here cpap compliance .pt states cpap machine is making noise Wife notes noise from CPAP, probably not mask leak.  Up to 6 bathroom trips at night for nocturia.   12/29/20- 75 year old male never smoker followed for OSA, Asthma, complicated by DM2, hx DVT, HTN,  morbid obesity CPAP auto 8-20/ [facility] Download-compliance 100%, AHI 3.1/ hr Body weight today-222 lbs Covid vax-3 Phizer Flu vax-had Sleeps ok but remains very tired in day. Naps helps some. CPAP helps some. Blames chronic back pain and possibly meds related to that. No leg movement disturbance. Discussed trial of adderall. Rarely needs rescue inhaler.   ROS-see HPI   + = positive Constitutional:   + weight loss, night sweats, fevers, chills, +fatigue, lassitude. HEENT:   No-  headaches, difficulty swallowing, tooth/dental problems, sore throat,       No-  sneezing, itching, ear ache, +nasal congestion, post nasal drip,  CV:  No-   chest pain, orthopnea, PND, swelling in lower extremities, anasarca, dizziness, palpitations Resp: No-   shortness of breath with exertion or at rest.              No-   productive cough,  No non-productive cough,  No- coughing up of blood.              No-   change in color of mucus.  + wheezing.   Skin: No-   rash or lesions. GI:  No-   heartburn, indigestion, abdominal pain, nausea, vomiting,  GU: . MS:  No-   joint pain or swelling.  + back pain. Neuro-     nothing unusual Psych:  No- change in mood or affect. No depression or anxiety.  No memory  loss.  OBJ- Physical Exam General- Alert, Oriented, Affect-appropriate, Distress-mild due to back pain . Has lost some weight.   +  obese Skin- rash-none, lesions- none, excoriation- none Lymphadenopathy- none Head- atraumatic.             Eyes- Gross vision intact, PERRLA, conjunctivae and secretions clear            Ears- +Hearing aid/ HOH            Nose- Clear, no-Septal dev, mucus, polyps, erosion, perforation             Throat- Mallampati III , mucosa clear , drainage- none, tonsils- atrophic. +Dentures Neck- flexible , trachea midline, no stridor , thyroid nl, carotid no bruit Chest - symmetrical excursion , unlabored           Heart/CV- RRR , no murmur , no gallop  , no rub, nl s1 s2                           - JVD- none , edema- none, stasis changes- none, varices- none           Lung- clear,  wheeze -none, cough- none , dullness-none, rub- none  Chest wall-  Abd-  Br/ Gen/ Rectal- Not done, not indicated Extrem- cyanosis- none, clubbing, none, atrophy- none, strength- nl Neuro-+ seems restless, shifting side to side

## 2020-12-29 ENCOUNTER — Other Ambulatory Visit: Payer: Self-pay

## 2020-12-29 ENCOUNTER — Encounter: Payer: Self-pay | Admitting: Internal Medicine

## 2020-12-29 ENCOUNTER — Ambulatory Visit: Payer: Medicare Other | Admitting: Internal Medicine

## 2020-12-29 DIAGNOSIS — J4520 Mild intermittent asthma, uncomplicated: Secondary | ICD-10-CM | POA: Diagnosis not present

## 2020-12-29 DIAGNOSIS — G4733 Obstructive sleep apnea (adult) (pediatric): Secondary | ICD-10-CM

## 2020-12-29 MED ORDER — ALBUTEROL SULFATE HFA 108 (90 BASE) MCG/ACT IN AERS
2.0000 | INHALATION_SPRAY | Freq: Four times a day (QID) | RESPIRATORY_TRACT | 12 refills | Status: DC | PRN
Start: 2020-12-29 — End: 2022-02-12

## 2020-12-29 MED ORDER — AMPHETAMINE-DEXTROAMPHETAMINE 10 MG PO TABS: ORAL_TABLET | ORAL | 0 refills | Status: DC

## 2020-12-29 NOTE — Assessment & Plan Note (Signed)
We will keep albuterol rescue inhaler available.

## 2020-12-29 NOTE — Patient Instructions (Signed)
Script sent for adderall to help with daytime tiredness. T/his doesn't replace sleep. Take a nap if you need to.  Script sent refilling your albuterol rescue inhaler to use if needed.  You are doing well with CPAP. We will continue auto 8-20  Please call if we  an help

## 2020-12-29 NOTE — Assessment & Plan Note (Signed)
Benefits with good compliance and control. Remains tired in day. Plan- Continue auto 8-20. Discussed sleep/ naps. Try adderall.

## 2020-12-30 ENCOUNTER — Telehealth: Payer: Self-pay | Admitting: Internal Medicine

## 2020-12-30 MED ORDER — AMPHETAMINE-DEXTROAMPHETAMINE 10 MG PO TABS: ORAL_TABLET | ORAL | 0 refills | Status: DC

## 2020-12-30 NOTE — Telephone Encounter (Signed)
Adderall script sent to [facility]

## 2020-12-30 NOTE — Telephone Encounter (Signed)
CY has sent the medication to the requested pharmacy per pts wife.  Nothing further is needed.  I attempted to call the pt but the VM is not set up.

## 2020-12-30 NOTE — Telephone Encounter (Signed)
CY please advise if you can send the rx for the adderall to [facility] instead of [facility].  [facility] is on backorder and they do not have this medication.

## 2021-01-07 DIAGNOSIS — Z794 Long term (current) use of insulin: Secondary | ICD-10-CM | POA: Diagnosis not present

## 2021-01-14 ENCOUNTER — Ambulatory Visit (INDEPENDENT_AMBULATORY_CARE_PROVIDER_SITE_OTHER): Payer: Medicare Other | Admitting: Family Medicine

## 2021-01-15 DIAGNOSIS — G4733 Obstructive sleep apnea (adult) (pediatric): Secondary | ICD-10-CM | POA: Diagnosis not present

## 2021-01-28 ENCOUNTER — Other Ambulatory Visit: Payer: Self-pay

## 2021-01-28 ENCOUNTER — Ambulatory Visit (INDEPENDENT_AMBULATORY_CARE_PROVIDER_SITE_OTHER): Payer: Medicare Other | Admitting: Family Medicine

## 2021-01-28 ENCOUNTER — Encounter (INDEPENDENT_AMBULATORY_CARE_PROVIDER_SITE_OTHER): Payer: Self-pay | Admitting: Family Medicine

## 2021-01-28 VITALS — BP 132/75 | HR 90 | Temp 98.5°F | Ht 70.0 in | Wt 215.0 lb

## 2021-01-28 DIAGNOSIS — E1159 Type 2 diabetes mellitus with other circulatory complications: Secondary | ICD-10-CM | POA: Diagnosis not present

## 2021-01-28 DIAGNOSIS — E1165 Type 2 diabetes mellitus with hyperglycemia: Secondary | ICD-10-CM

## 2021-01-28 DIAGNOSIS — I152 Hypertension secondary to endocrine disorders: Secondary | ICD-10-CM

## 2021-01-28 DIAGNOSIS — E1169 Type 2 diabetes mellitus with other specified complication: Secondary | ICD-10-CM

## 2021-01-28 DIAGNOSIS — Z6839 Body mass index (BMI) 39.0-39.9, adult: Secondary | ICD-10-CM

## 2021-01-28 DIAGNOSIS — E785 Hyperlipidemia, unspecified: Secondary | ICD-10-CM | POA: Diagnosis not present

## 2021-01-28 NOTE — Progress Notes (Signed)
Chief Complaint:   OBESITY Max [name] is here to discuss his progress with his obesity treatment plan along with follow-up of his obesity related diagnoses. Max [name] is on the Category 3 Plan and states he is following his eating plan approximately 60% of the time. Max [name] states he is walking the dog 2-3 times per week.  Today's visit was #: 52 Starting weight: 273 lbs Starting date: 09/28/2017 Today's weight: 215 lbs Today's date: 01/28/2021 Total lbs lost to date: 65 Total lbs lost since last in-office visit: 4  Interim History: Max [name] is still dealing with quite a significant amount of back pain and is considering switching pain clinics, as he is not experiencing any relief. He has been walking the dog frequently and trying to work protein into meal plan. Pt has decreased appetite recently. He is working with Dr. Buddy Duty.  Subjective:   1. Hyperlipidemia associated with type 2 diabetes mellitus (Max [name]) Chief is on Crestor with no myalgias or transaminitis. His last LDL was 103, HDL 51, and triglycerides 90.  2. Hypertension associated with diabetes (Max [name]) BP controlled today. Pt is on Cozaar and Norvasc.  3. Type 2 diabetes mellitus with hyperglycemia, without long-term current use of insulin (HCC) Max [name] is on [name] and [name]. His last A1c was 7.9 and he is managed by Dr. Buddy Duty.  Assessment/Plan:   1. Hyperlipidemia associated with type 2 diabetes mellitus (Max [name]) Cardiovascular risk and specific lipid/LDL goals reviewed.  We discussed several lifestyle modifications today and Max [name] will continue to work on diet, exercise and weight loss efforts. Orders and follow up as documented in patient record. Continue Crestor.  Counseling Intensive lifestyle modifications are the first line treatment for this issue. Dietary changes: Increase soluble fiber. Decrease simple carbohydrates. Exercise changes: Moderate to vigorous-intensity aerobic activity 150 minutes per week if  tolerated. Lipid-lowering medications: see documented in medical record.  2. Hypertension associated with diabetes (Max [name]) Latonya is working on healthy weight loss and exercise to improve blood pressure control. We will watch for signs of hypotension as he continues his lifestyle modifications. Continue current treatment plan with no change in doses.  3. Type 2 diabetes mellitus with hyperglycemia, without long-term current use of insulin (HCC) Good blood sugar control is important to decrease the likelihood of diabetic complications such as nephropathy, neuropathy, limb loss, blindness, coronary artery disease, and death. Intensive lifestyle modification including diet, exercise and weight loss are the first line of treatment for diabetes. Continue current meds and follow up with Dr. Buddy Duty.  4. Obesity BMI today 30  Max [name] is currently in the action stage of change. As such, his goal is to continue with weight loss efforts. He has agreed to the Category 3 Plan.   Exercise goals:  As is  Behavioral modification strategies: increasing lean protein intake, meal planning and cooking strategies, keeping healthy foods in the home, and planning for success.  Max [name] has agreed to follow-up with our clinic in 4-5 weeks. He was informed of the importance of frequent follow-up visits to maximize his success with intensive lifestyle modifications for his multiple health conditions.   Objective:   Blood pressure 132/75, pulse 90, temperature 98.5 F (36.9 C), height 5\' 10"  (1.778 m), weight 215 lb (97.5 kg), SpO2 97 %. Body mass index is 30.85 kg/m.  General: Cooperative, alert, well developed, in no acute distress. HEENT: Conjunctivae and lids unremarkable. Cardiovascular: Regular rhythm.  Lungs: Normal work of breathing. Neurologic: No focal deficits.   Lab Results  Component Value Date  CREATININE 0.78 01/03/2020   BUN 11 01/03/2020   NA 143 01/03/2020   K 4.4 01/03/2020   CL 102  01/03/2020   CO2 27 01/03/2020   Lab Results  Component Value Date   ALT 18 01/03/2020   AST 13 01/03/2020   ALKPHOS 99 01/03/2020   BILITOT 0.4 01/03/2020   Lab Results  Component Value Date   HGBA1C 7.9 (H) 01/03/2020   HGBA1C 7.6 (H) 12/11/2018   HGBA1C 7.7 (H) 05/15/2018   HGBA1C 8.3 (H) 09/28/2017   HGBA1C 7.2 (H) 03/09/2016   Lab Results  Component Value Date   INSULIN 8.1 01/03/2020   Lab Results  Component Value Date   TSH 0.471 09/28/2017   Lab Results  Component Value Date   CHOL 171 01/03/2020   HDL 51 01/03/2020   LDLCALC 103 (H) 01/03/2020   TRIG 90 01/03/2020   CHOLHDL 3.8 Ratio 01/09/2008   Lab Results  Component Value Date   VD25OH 60.1 01/03/2020   VD25OH 50.1 09/28/2017   Lab Results  Component Value Date   WBC 12.0 (H) 09/27/2019   HGB 14.3 09/27/2019   HCT 43.7 09/27/2019   MCV 79.4 09/27/2019   PLT 251.0 09/27/2019   Lab Results  Component Value Date   IRON 65 09/27/2019   TIBC 309 09/27/2019   FERRITIN 123.8 09/27/2019    Attestation Statements:   Reviewed by clinician on day of visit: allergies, medications, problem list, medical history, surgical history, family history, social history, and previous encounter notes.  Time spent on visit including pre-visit chart review and post-visit care and charting was 18 minutes.   Coral Ceo, CMA, am acting as transcriptionist for [name], MD.   I have reviewed the above documentation for accuracy and completeness, and I agree with the above. - [name], MD

## 2021-02-05 DIAGNOSIS — E1169 Type 2 diabetes mellitus with other specified complication: Secondary | ICD-10-CM | POA: Diagnosis not present

## 2021-02-05 DIAGNOSIS — E1142 Type 2 diabetes mellitus with diabetic polyneuropathy: Secondary | ICD-10-CM | POA: Diagnosis not present

## 2021-02-05 DIAGNOSIS — E1165 Type 2 diabetes mellitus with hyperglycemia: Secondary | ICD-10-CM | POA: Diagnosis not present

## 2021-02-05 DIAGNOSIS — R748 Abnormal levels of other serum enzymes: Secondary | ICD-10-CM | POA: Diagnosis not present

## 2021-02-07 DIAGNOSIS — Z794 Long term (current) use of insulin: Secondary | ICD-10-CM | POA: Diagnosis not present

## 2021-02-08 NOTE — Addendum Note (Signed)
Addended by: Laqueta Linden on: 02/08/2021 08:35 PM   Modules accepted: Level of Service

## 2021-03-04 DIAGNOSIS — I10 Essential (primary) hypertension: Secondary | ICD-10-CM | POA: Diagnosis not present

## 2021-03-04 DIAGNOSIS — G8929 Other chronic pain: Secondary | ICD-10-CM | POA: Diagnosis not present

## 2021-03-04 DIAGNOSIS — E1169 Type 2 diabetes mellitus with other specified complication: Secondary | ICD-10-CM | POA: Diagnosis not present

## 2021-03-04 DIAGNOSIS — Z1389 Encounter for screening for other disorder: Secondary | ICD-10-CM | POA: Diagnosis not present

## 2021-03-04 DIAGNOSIS — E785 Hyperlipidemia, unspecified: Secondary | ICD-10-CM | POA: Diagnosis not present

## 2021-03-04 DIAGNOSIS — Z0000 Encounter for general adult medical examination without abnormal findings: Secondary | ICD-10-CM | POA: Diagnosis not present

## 2021-03-04 DIAGNOSIS — M549 Dorsalgia, unspecified: Secondary | ICD-10-CM | POA: Diagnosis not present

## 2021-03-04 DIAGNOSIS — G4733 Obstructive sleep apnea (adult) (pediatric): Secondary | ICD-10-CM | POA: Diagnosis not present

## 2021-03-05 ENCOUNTER — Ambulatory Visit (INDEPENDENT_AMBULATORY_CARE_PROVIDER_SITE_OTHER): Payer: Medicare Other | Admitting: Family Medicine

## 2021-03-09 DIAGNOSIS — Z794 Long term (current) use of insulin: Secondary | ICD-10-CM | POA: Diagnosis not present

## 2021-03-11 ENCOUNTER — Encounter (INDEPENDENT_AMBULATORY_CARE_PROVIDER_SITE_OTHER): Payer: Self-pay | Admitting: Family Medicine

## 2021-03-11 ENCOUNTER — Other Ambulatory Visit: Payer: Self-pay

## 2021-03-11 ENCOUNTER — Ambulatory Visit (INDEPENDENT_AMBULATORY_CARE_PROVIDER_SITE_OTHER): Payer: Medicare Other | Admitting: Family Medicine

## 2021-03-11 VITALS — BP 126/68 | HR 98 | Temp 97.8°F | Ht 70.0 in | Wt 215.0 lb

## 2021-03-11 DIAGNOSIS — G8929 Other chronic pain: Secondary | ICD-10-CM | POA: Diagnosis not present

## 2021-03-11 DIAGNOSIS — Z6839 Body mass index (BMI) 39.0-39.9, adult: Secondary | ICD-10-CM

## 2021-03-11 DIAGNOSIS — E1159 Type 2 diabetes mellitus with other circulatory complications: Secondary | ICD-10-CM

## 2021-03-11 DIAGNOSIS — M545 Low back pain, unspecified: Secondary | ICD-10-CM | POA: Diagnosis not present

## 2021-03-11 DIAGNOSIS — E1165 Type 2 diabetes mellitus with hyperglycemia: Secondary | ICD-10-CM

## 2021-03-11 DIAGNOSIS — I152 Hypertension secondary to endocrine disorders: Secondary | ICD-10-CM | POA: Diagnosis not present

## 2021-03-11 NOTE — Progress Notes (Signed)
Chief Complaint:   OBESITY Gracin is here to discuss his progress with his obesity treatment plan along with follow-up of his obesity related diagnoses. Yeng is on the Category 3 Plan and states he is following his eating plan approximately 70% of the time. Pierce states he is walking 1/2 mile 3-4 times per week.  Today's visit was #: 18 Starting weight: 273 lbs Starting date: 09/28/2017 Today's weight: 215 lbs Today's date: 03/11/2021 Total lbs lost to date: 11 Total lbs lost since last in-office visit: 0  Interim History: Chick had a great Thanksgiving- got together with family and his son booked a hotel for him. The last few weeks have been a combination of happy and sad moments- baby brother died. He may be traveling for Christmas but is not sure. Pt wants to increase physical activity.  Subjective:   1. Hypertension associated with diabetes ([place]) BP very well controlled today. Pt is on losartan and amlodipine.  2. Type 2 diabetes mellitus with hyperglycemia, without long-term current use of insulin ([place]) He is on [name] and Jardiance and is managed by cardiology.  3. Chronic low back pain, unspecified back pain laterality, unspecified whether sciatica present Peregrine is going to [facility] pain clinic. Unfortunately, he has still really struggled with pain control.  Assessment/Plan:   1. Hypertension associated with diabetes ([place]) Zackry is working on healthy weight loss and exercise to improve blood pressure control. We will watch for signs of hypotension as he continues his lifestyle modifications. Continue current meds with no change in dose.  2. Type 2 diabetes mellitus with hyperglycemia, without long-term current use of insulin (HCC) Good blood sugar control is important to decrease the likelihood of diabetic complications such as nephropathy, neuropathy, limb loss, blindness, coronary artery disease, and death. Intensive lifestyle modification including  diet, exercise and weight loss are the first line of treatment for diabetes. Continue current meds and follow up with Dr. Buddy Duty in January.  3. Chronic low back pain, unspecified back pain laterality, unspecified whether sciatica present Refer to [facility].  4. Obesity with current BMI of 30.9  Ruben is currently in the action stage of change. As such, his goal is to continue with weight loss efforts. He has agreed to the Category 3 Plan.   Exercise goals: For substantial health benefits, adults should do at least 150 minutes (2 hours and 30 minutes) a week of moderate-intensity, or 75 minutes (1 hour and 15 minutes) a week of vigorous-intensity aerobic physical activity, or an equivalent combination of moderate- and vigorous-intensity aerobic activity. Aerobic activity should be performed in episodes of at least 10 minutes, and preferably, it should be spread throughout the week.  Behavioral modification strategies: increasing lean protein intake, meal planning and cooking strategies, keeping healthy foods in the home, and planning for success.  Marvon has agreed to follow-up with our clinic in 6 weeks. He was informed of the importance of frequent follow-up visits to maximize his success with intensive lifestyle modifications for his multiple health conditions.   Objective:   Blood pressure 126/68, pulse 98, temperature 97.8 F (36.6 C), height 5\' 10"  (1.778 m), weight 215 lb (97.5 kg), SpO2 99 %. Body mass index is 30.85 kg/m.  General: Cooperative, alert, well developed, in no acute distress. HEENT: Conjunctivae and lids unremarkable. Cardiovascular: Regular rhythm.  Lungs: Normal work of breathing. Neurologic: No focal deficits.   Lab Results  Component Value Date   CREATININE 0.78 01/03/2020   BUN 11 01/03/2020  NA 143 01/03/2020   K 4.4 01/03/2020   CL 102 01/03/2020   CO2 27 01/03/2020   Lab Results  Component Value Date   ALT 18 01/03/2020   AST 13  01/03/2020   ALKPHOS 99 01/03/2020   BILITOT 0.4 01/03/2020   Lab Results  Component Value Date   HGBA1C 7.9 (H) 01/03/2020   HGBA1C 7.6 (H) 12/11/2018   HGBA1C 7.7 (H) 05/15/2018   HGBA1C 8.3 (H) 09/28/2017   HGBA1C 7.2 (H) 03/09/2016   Lab Results  Component Value Date   INSULIN 8.1 01/03/2020   Lab Results  Component Value Date   TSH 0.471 09/28/2017   Lab Results  Component Value Date   CHOL 171 01/03/2020   HDL 51 01/03/2020   LDLCALC 103 (H) 01/03/2020   TRIG 90 01/03/2020   CHOLHDL 3.8 Ratio 01/09/2008   Lab Results  Component Value Date   VD25OH 60.1 01/03/2020   VD25OH 50.1 09/28/2017   Lab Results  Component Value Date   WBC 12.0 (H) 09/27/2019   HGB 14.3 09/27/2019   HCT 43.7 09/27/2019   MCV 79.4 09/27/2019   PLT 251.0 09/27/2019   Lab Results  Component Value Date   IRON 65 09/27/2019   TIBC 309 09/27/2019   FERRITIN 123.8 09/27/2019   Attestation Statements:   Reviewed by clinician on day of visit: allergies, medications, problem list, medical history, surgical history, family history, social history, and previous encounter notes.  Coral Ceo, CMA, am acting as transcriptionist for [name], MD.   I have reviewed the above documentation for accuracy and completeness, and I agree with the above. - [name], MD

## 2021-03-16 ENCOUNTER — Other Ambulatory Visit: Payer: Self-pay | Admitting: Urology

## 2021-03-16 DIAGNOSIS — C61 Malignant neoplasm of prostate: Secondary | ICD-10-CM

## 2021-03-16 DIAGNOSIS — R972 Elevated prostate specific antigen [PSA]: Secondary | ICD-10-CM

## 2021-04-09 DIAGNOSIS — E1136 Type 2 diabetes mellitus with diabetic cataract: Secondary | ICD-10-CM | POA: Diagnosis not present

## 2021-04-09 DIAGNOSIS — H25812 Combined forms of age-related cataract, left eye: Secondary | ICD-10-CM | POA: Diagnosis not present

## 2021-04-09 DIAGNOSIS — H4321 Crystalline deposits in vitreous body, right eye: Secondary | ICD-10-CM | POA: Diagnosis not present

## 2021-04-09 DIAGNOSIS — H524 Presbyopia: Secondary | ICD-10-CM | POA: Diagnosis not present

## 2021-04-09 DIAGNOSIS — Z794 Long term (current) use of insulin: Secondary | ICD-10-CM | POA: Diagnosis not present

## 2021-04-09 DIAGNOSIS — H5213 Myopia, bilateral: Secondary | ICD-10-CM | POA: Diagnosis not present

## 2021-04-09 DIAGNOSIS — H52203 Unspecified astigmatism, bilateral: Secondary | ICD-10-CM | POA: Diagnosis not present

## 2021-04-09 DIAGNOSIS — Z961 Presence of intraocular lens: Secondary | ICD-10-CM | POA: Diagnosis not present

## 2021-04-10 DIAGNOSIS — R748 Abnormal levels of other serum enzymes: Secondary | ICD-10-CM | POA: Diagnosis not present

## 2021-04-14 ENCOUNTER — Other Ambulatory Visit: Payer: Medicare Other

## 2021-04-17 ENCOUNTER — Telehealth: Payer: Self-pay | Admitting: Internal Medicine

## 2021-04-17 DIAGNOSIS — G4733 Obstructive sleep apnea (adult) (pediatric): Secondary | ICD-10-CM

## 2021-04-17 NOTE — Telephone Encounter (Signed)
Ok to print order- Max [name] can show you how if needed.     CPAP machine of choice, autopap 8-20, mask of choice, humidifier, supplies, SD card for download capability   Dx OSA

## 2021-04-17 NOTE — Telephone Encounter (Signed)
Called patient and he stated that he would like a printed order for his new CPAP machine. I told patient that I would message Dr Annamaria Boots and see if this was possible.   Dr Annamaria Boots please advise

## 2021-04-22 ENCOUNTER — Other Ambulatory Visit: Payer: Self-pay

## 2021-04-22 ENCOUNTER — Encounter (INDEPENDENT_AMBULATORY_CARE_PROVIDER_SITE_OTHER): Payer: Self-pay | Admitting: Family Medicine

## 2021-04-22 ENCOUNTER — Ambulatory Visit (INDEPENDENT_AMBULATORY_CARE_PROVIDER_SITE_OTHER): Payer: Medicare Other | Admitting: Family Medicine

## 2021-04-22 VITALS — BP 124/73 | HR 96 | Temp 98.5°F | Ht 70.0 in | Wt 214.0 lb

## 2021-04-22 DIAGNOSIS — Z794 Long term (current) use of insulin: Secondary | ICD-10-CM

## 2021-04-22 DIAGNOSIS — G8929 Other chronic pain: Secondary | ICD-10-CM | POA: Diagnosis not present

## 2021-04-22 DIAGNOSIS — E669 Obesity, unspecified: Secondary | ICD-10-CM

## 2021-04-22 DIAGNOSIS — M549 Dorsalgia, unspecified: Secondary | ICD-10-CM

## 2021-04-22 DIAGNOSIS — Z6830 Body mass index (BMI) 30.0-30.9, adult: Secondary | ICD-10-CM

## 2021-04-22 DIAGNOSIS — E1159 Type 2 diabetes mellitus with other circulatory complications: Secondary | ICD-10-CM

## 2021-04-22 DIAGNOSIS — I152 Hypertension secondary to endocrine disorders: Secondary | ICD-10-CM | POA: Diagnosis not present

## 2021-04-22 DIAGNOSIS — E1165 Type 2 diabetes mellitus with hyperglycemia: Secondary | ICD-10-CM | POA: Diagnosis not present

## 2021-04-22 NOTE — Telephone Encounter (Signed)
Called and spoke with patient about needing an order for his CPAP machine. Asked him if he wanted to pick up a prescription or if he wanted [name] to send it to [place] like we normally do. He said to send it if that's what we normally do. Order has been placed. Advised patient that he should hear from them about it. He expressed understanding. Nothing further needed at this time.

## 2021-04-22 NOTE — Progress Notes (Signed)
Chief Complaint:   OBESITY Max [name] is here to discuss his progress with his obesity treatment plan along with follow-up of his obesity related diagnoses. Max [name] is on the Category 3 Plan and states he is following his eating plan approximately 45-50% of the time. Max [name] states he is walking 1 mile 7 times per week.  Today's visit was #: 43 Starting weight: 273 lbs Starting date: 09/28/2017 Today's weight: 214 lbs Today's date: 04/22/2021 Total lbs lost to date: 46 Total lbs lost since last in-office visit: 1  Interim History: Max [name] had a great holiday- went and saw family. For New Years, he and his wife stayed home. He's been walking his dog and eating but not as much. Max [name] did buy a pound cake and ice cream but has been controlled in his consumption. He has been eating cereal (Raisin Bran) but wife helps control quantity. Max [name]'s plans for the next few weeks include celebrating his wife's birthday.  Subjective:   1. Type 2 diabetes mellitus with hyperglycemia, with long-term current use of insulin (HCC) A1c ~8.0, per Max [name] (checked by Dr. Buddy Duty). Max [name] is occasionally taking insulin based on food choices and blood sugar readings. He is taking insulin ~3 times a week- doing ~20 units when he is taking insulin.  2. Hypertension associated with diabetes ([place]) BP well controlled. Max [name] denies chest pain/chest pressure/headache.  3. Chronic back pain, unspecified back location, unspecified back pain laterality Max [name] has a new pain doctor appt scheduled for next month.  Assessment/Plan:   1. Type 2 diabetes mellitus with hyperglycemia, with long-term current use of insulin (HCC) Good blood sugar control is important to decrease the likelihood of diabetic complications such as nephropathy, neuropathy, limb loss, blindness, coronary artery disease, and death. Intensive lifestyle modification including diet, exercise and weight loss are the first line of treatment for diabetes. Discussed with Max [name] the  significance of maintaining portion size but changed proportion of food makeup.  2. Hypertension associated with diabetes ([place]) Max [name] is working on healthy weight loss and exercise to improve blood pressure control. We will watch for signs of hypotension as he continues his lifestyle modifications. Continue current treatment plan with no change in dose.  3. Chronic back pain, unspecified back location, unspecified back pain laterality Follow up at next appt.  4. Obesity with current BMI of 30.8 Max [name] is currently in the action stage of change. As such, his goal is to continue with weight loss efforts. He has agreed to the Category 3 Plan.   Exercise goals: All adults should avoid inactivity. Some physical activity is better than none, and adults who participate in any amount of physical activity gain some health benefits.  Behavioral modification strategies: increasing lean protein intake, no skipping meals, meal planning and cooking strategies, keeping healthy foods in the home, and planning for success.  Max [name] has agreed to follow-up with our clinic in 6-7 weeks. He was informed of the importance of frequent follow-up visits to maximize his success with intensive lifestyle modifications for his multiple health conditions.   Objective:   Blood pressure 124/73, pulse 96, temperature 98.5 F (36.9 C), height 5\' 10"  (1.778 m), weight 214 lb (97.1 kg), SpO2 98 %. Body mass index is 30.71 kg/m.  General: Cooperative, alert, well developed, in no acute distress. HEENT: Conjunctivae and lids unremarkable. Cardiovascular: Regular rhythm.  Lungs: Normal work of breathing. Neurologic: No focal deficits.   Lab Results  Component Value Date   CREATININE 0.78 01/03/2020   BUN 11  01/03/2020   NA 143 01/03/2020   K 4.4 01/03/2020   CL 102 01/03/2020   CO2 27 01/03/2020   Lab Results  Component Value Date   ALT 18 01/03/2020   AST 13 01/03/2020   ALKPHOS 99 01/03/2020   BILITOT 0.4  01/03/2020   Lab Results  Component Value Date   HGBA1C 7.9 (H) 01/03/2020   HGBA1C 7.6 (H) 12/11/2018   HGBA1C 7.7 (H) 05/15/2018   HGBA1C 8.3 (H) 09/28/2017   HGBA1C 7.2 (H) 03/09/2016   Lab Results  Component Value Date   INSULIN 8.1 01/03/2020   Lab Results  Component Value Date   TSH 0.471 09/28/2017   Lab Results  Component Value Date   CHOL 171 01/03/2020   HDL 51 01/03/2020   LDLCALC 103 (H) 01/03/2020   TRIG 90 01/03/2020   CHOLHDL 3.8 Ratio 01/09/2008   Lab Results  Component Value Date   VD25OH 60.1 01/03/2020   VD25OH 50.1 09/28/2017   Lab Results  Component Value Date   WBC 12.0 (H) 09/27/2019   HGB 14.3 09/27/2019   HCT 43.7 09/27/2019   MCV 79.4 09/27/2019   PLT 251.0 09/27/2019   Lab Results  Component Value Date   IRON 65 09/27/2019   TIBC 309 09/27/2019   FERRITIN 123.8 09/27/2019   Attestation Statements:   Reviewed by clinician on day of visit: allergies, medications, problem list, medical history, surgical history, family history, social history, and previous encounter notes.  Coral Ceo, CMA, am acting as transcriptionist for [name], MD.   I have reviewed the above documentation for accuracy and completeness, and I agree with the above. - [name], MD

## 2021-04-23 ENCOUNTER — Telehealth: Payer: Self-pay | Admitting: Internal Medicine

## 2021-04-28 DIAGNOSIS — Z79891 Long term (current) use of opiate analgesic: Secondary | ICD-10-CM | POA: Diagnosis not present

## 2021-04-28 DIAGNOSIS — G894 Chronic pain syndrome: Secondary | ICD-10-CM | POA: Diagnosis not present

## 2021-04-28 DIAGNOSIS — F32A Depression, unspecified: Secondary | ICD-10-CM | POA: Diagnosis not present

## 2021-04-28 DIAGNOSIS — M47816 Spondylosis without myelopathy or radiculopathy, lumbar region: Secondary | ICD-10-CM | POA: Diagnosis not present

## 2021-04-29 ENCOUNTER — Other Ambulatory Visit: Payer: Self-pay

## 2021-04-29 ENCOUNTER — Ambulatory Visit
Admission: RE | Admit: 2021-04-29 | Discharge: 2021-04-29 | Disposition: A | Discharge status: Home / Self Care | Payer: Medicare Other | Source: Ambulatory Visit | Attending: Urology | Admitting: Urology

## 2021-04-29 DIAGNOSIS — C61 Malignant neoplasm of prostate: Secondary | ICD-10-CM

## 2021-04-29 DIAGNOSIS — R972 Elevated prostate specific antigen [PSA]: Secondary | ICD-10-CM

## 2021-04-29 MED ORDER — GADOBENATE DIMEGLUMINE 529 MG/ML IV SOLN
15.0000 mL | Freq: Once | INTRAVENOUS | Status: AC | PRN
  Administered 2021-04-29: 15 mL via INTRAVENOUS

## 2021-05-04 ENCOUNTER — Ambulatory Visit: Payer: Medicare Other | Admitting: Internal Medicine

## 2021-05-08 DIAGNOSIS — E1165 Type 2 diabetes mellitus with hyperglycemia: Secondary | ICD-10-CM | POA: Diagnosis not present

## 2021-05-08 DIAGNOSIS — E1142 Type 2 diabetes mellitus with diabetic polyneuropathy: Secondary | ICD-10-CM | POA: Diagnosis not present

## 2021-05-08 DIAGNOSIS — E1169 Type 2 diabetes mellitus with other specified complication: Secondary | ICD-10-CM | POA: Diagnosis not present

## 2021-05-08 DIAGNOSIS — E0590 Thyrotoxicosis, unspecified without thyrotoxic crisis or storm: Secondary | ICD-10-CM | POA: Diagnosis not present

## 2021-05-15 NOTE — Telephone Encounter (Signed)
Looks like encounter was open in error so closing encounter. 

## 2021-05-27 DIAGNOSIS — G894 Chronic pain syndrome: Secondary | ICD-10-CM | POA: Diagnosis not present

## 2021-05-27 DIAGNOSIS — F32A Depression, unspecified: Secondary | ICD-10-CM | POA: Diagnosis not present

## 2021-05-27 DIAGNOSIS — M47816 Spondylosis without myelopathy or radiculopathy, lumbar region: Secondary | ICD-10-CM | POA: Diagnosis not present

## 2021-06-03 ENCOUNTER — Other Ambulatory Visit: Payer: Self-pay

## 2021-06-03 ENCOUNTER — Encounter (INDEPENDENT_AMBULATORY_CARE_PROVIDER_SITE_OTHER): Payer: Self-pay | Admitting: Family Medicine

## 2021-06-03 ENCOUNTER — Ambulatory Visit (INDEPENDENT_AMBULATORY_CARE_PROVIDER_SITE_OTHER): Payer: Medicare Other | Admitting: Family Medicine

## 2021-06-03 VITALS — BP 130/76 | Ht 70.0 in | Wt 213.0 lb

## 2021-06-03 DIAGNOSIS — M545 Low back pain, unspecified: Secondary | ICD-10-CM

## 2021-06-03 DIAGNOSIS — Z794 Long term (current) use of insulin: Secondary | ICD-10-CM

## 2021-06-03 DIAGNOSIS — E669 Obesity, unspecified: Secondary | ICD-10-CM | POA: Diagnosis not present

## 2021-06-03 DIAGNOSIS — G8929 Other chronic pain: Secondary | ICD-10-CM | POA: Diagnosis not present

## 2021-06-03 DIAGNOSIS — E66812 Obesity, class 2: Secondary | ICD-10-CM

## 2021-06-03 DIAGNOSIS — Z6830 Body mass index (BMI) 30.0-30.9, adult: Secondary | ICD-10-CM

## 2021-06-03 DIAGNOSIS — Z6839 Body mass index (BMI) 39.0-39.9, adult: Secondary | ICD-10-CM

## 2021-06-03 DIAGNOSIS — E1165 Type 2 diabetes mellitus with hyperglycemia: Secondary | ICD-10-CM

## 2021-06-04 NOTE — Progress Notes (Unsigned)
Chief Complaint:   OBESITY Max [name] is here to discuss his progress with his obesity treatment plan along with follow-up of his obesity related diagnoses. Max [name] is on the Category 3 Plan and states he is following his eating plan approximately 60-70% of the time. Max [name] states he is walking the dog for 25-30 minutes 7 times per week.  Today's visit was #: 74 Starting weight: 273 lbs Starting date: 09/28/2017 Today's weight: 213 lbs Today's date: 06/03/2021 Total lbs lost to date: 60 lbs Total lbs lost since last in-office visit: 1 lb  Interim History: Max [name] has had a pain filled last few weeks.  He saw a new pain management provider, but unfortunately, due to insurance, has not been able to get his medications filled.  He has been very mindful of food intake and makeup of food.  Not weighing protein, but eyeballing quantity.  Also frequently skipping meals daily.  Subjective:   1. Type 2 diabetes mellitus with hyperglycemia, with long-term current use of insulin (HCC) Max [name]'s blood sugars are ranging from 24-373 (very labile but most ~160).  Patient and wife report he was started on daily injection but not sure what it is.  2. Chronic low back pain, unspecified back pain laterality, unspecified whether sciatica present He just saw a new pain management provider.  He is awaiting a new prescription.  Assessment/Plan:   1. Type 2 diabetes mellitus with hyperglycemia, with long-term current use of insulin (HCC) Good blood sugar control is important to decrease the likelihood of diabetic complications such as nephropathy, neuropathy, limb loss, blindness, coronary artery disease, and death. Intensive lifestyle modification including diet, exercise and weight loss are the first line of treatment for diabetes.  Continue current medications.  Needs follow-up on what medications he is on at next appointment.  He will continue mindful eating with controlling carbs.  2. Chronic low back  pain, unspecified back pain laterality, unspecified whether sciatica present Max [name] is to call the clinic and inform us on the medications he is waiting for.  3. Obesity with current BMI of 30.6  Max [name] is currently in the action stage of change. As such, his goal is to continue with weight loss efforts. He has agreed to the Category 3 Plan.   Exercise goals: No exercise has been prescribed at this time.  Behavioral modification strategies: increasing lean protein intake, no skipping meals, meal planning and cooking strategies, and keeping healthy foods in the home.  Max [name] has agreed to follow-up with our clinic in 8 weeks. He was informed of the importance of frequent follow-up visits to maximize his success with intensive lifestyle modifications for his multiple health conditions.   Objective:   Blood pressure 130/76, height '5\' 10"'$  (1.778 m), weight 213 lb (96.6 kg). Body mass index is 30.56 kg/m.  General: Cooperative, alert, well developed, in no acute distress. HEENT: Conjunctivae and lids unremarkable. Cardiovascular: Regular rhythm.  Lungs: Normal work of breathing. Neurologic: No focal deficits.   Lab Results  Component Value Date   CREATININE 0.78 01/03/2020   BUN 11 01/03/2020   NA 143 01/03/2020   K 4.4 01/03/2020   CL 102 01/03/2020   CO2 27 01/03/2020   Lab Results  Component Value Date   ALT 18 01/03/2020   AST 13 01/03/2020   ALKPHOS 99 01/03/2020   BILITOT 0.4 01/03/2020   Lab Results  Component Value Date   HGBA1C 7.9 (H) 01/03/2020   HGBA1C 7.6 (H) 12/11/2018   HGBA1C 7.7 (H)  05/15/2018   HGBA1C 8.3 (H) 09/28/2017   HGBA1C 7.2 (H) 03/09/2016   Lab Results  Component Value Date   INSULIN 8.1 01/03/2020   Lab Results  Component Value Date   TSH 0.471 09/28/2017   Lab Results  Component Value Date   CHOL 171 01/03/2020   HDL 51 01/03/2020   LDLCALC 103 (H) 01/03/2020   TRIG 90 01/03/2020   CHOLHDL 3.8 Ratio 01/09/2008   Lab Results   Component Value Date   VD25OH 60.1 01/03/2020   VD25OH 50.1 09/28/2017   Lab Results  Component Value Date   WBC 12.0 (H) 09/27/2019   HGB 14.3 09/27/2019   HCT 43.7 09/27/2019   MCV 79.4 09/27/2019   PLT 251.0 09/27/2019   Lab Results  Component Value Date   IRON 65 09/27/2019   TIBC 309 09/27/2019   FERRITIN 123.8 09/27/2019   Attestation Statements:   Reviewed by clinician on day of visit: allergies, medications, problem list, medical history, surgical history, family history, social history, and previous encounter notes.  I, Water quality scientist, CMA, am acting as transcriptionist for [name], MD.  I have reviewed the above documentation for accuracy and completeness, and I agree with the above. -  ***

## 2021-06-05 DIAGNOSIS — M47816 Spondylosis without myelopathy or radiculopathy, lumbar region: Secondary | ICD-10-CM | POA: Diagnosis not present

## 2021-06-10 NOTE — Progress Notes (Signed)
HPI ? male never smoker followed for OSA, complicated by DM, hx DVT, HBP, morbid obesity ?------------------- ?  ? ?12/29/20- 76 year old male never smoker followed for OSA, Asthma, complicated by DM2, hx DVT, HTN,  morbid obesity ?CPAP auto 8-20/ [facility] ?Download-compliance 100%, AHI 3.1/ hr ?Body weight today-222 lbs ?Covid vax-3 Phizer ?Flu vax-had ?Sleeps ok but remains very tired in day. Naps helps some. CPAP helps some. Blames chronic back pain and possibly meds related to that. No leg movement disturbance. Discussed trial of adderall. ?Rarely needs rescue inhaler. ? ?06/11/21- 76 year old male never smoker followed for OSA, Asthma, complicated by DM2, hx DVT, HTN,  morbid obesity, Hyperlipidemia,  ?CPAP auto 8-20/ [facility] ?Download-compliance 97%, AHI 4.8/ hr ?Body weight today- ?Covid vax-4 Phizer ?Flu vax-had ?His machine is due to be replaced and needs some maintenance but otherwise he has done well with good compliance and control on review of download.  Breathing is comfortable with no acute events.  He had had Adderall at one point but did not find it useful. ? ?ROS-see HPI   + = positive ?Constitutional:   + weight loss, night sweats, fevers, chills, +fatigue, lassitude. ?HEENT:   No-  headaches, difficulty swallowing, tooth/dental problems, sore throat,  ?     No-  sneezing, itching, ear ache, +nasal congestion, post nasal drip,  ?CV:  No-   chest pain, orthopnea, PND, swelling in lower extremities, anasarca, dizziness, palpitations ?Resp: No-   shortness of breath with exertion or at rest.   ?           No-   productive cough,  No non-productive cough,  No- coughing up of blood.   ?           No-   change in color of mucus.  + wheezing.   ?Skin: No-   rash or lesions. ?GI:  No-   heartburn, indigestion, abdominal pain, nausea, vomiting,  ?GU: . ?MS:  No-   joint pain or swelling.  + back pain. ?Neuro-     nothing unusual ?Psych:  No- change in mood or affect. No depression or anxiety.  No memory  loss. ? ?OBJ- Physical Exam ?General- Alert, Oriented, Affect-appropriate,  +  obese ?Skin- rash-none, lesions- none, excoriation- none ?Lymphadenopathy- none ?Head- atraumatic.  ?           Eyes- Gross vision intact, PERRLA, conjunctivae and secretions clear ?           Ears- +Hearing aid/ HOH ?           Nose- Clear, no-Septal dev, mucus, polyps, erosion, perforation  ?           Throat- Mallampati III , mucosa clear , drainage- none, tonsils- atrophic. +Dentures ?Neck- flexible , trachea midline, no stridor , thyroid nl, carotid no bruit ?Chest - symmetrical excursion , unlabored ?          Heart/CV- RRR , no murmur , no gallop  , no rub, nl s1 s2 ?                          - JVD- none , edema- none, stasis changes- none, varices- none ?          Lung- clear,  wheeze -none, cough- none , dullness-none, rub- none ?          Chest wall-  ?Abd-  ?Br/ Gen/ Rectal- Not done, not indicated ?Extrem- cyanosis- none, clubbing, none, atrophy- none,  strength- nl ?Neuro-+ seems restless, shifting side to side ? ? ? ?

## 2021-06-11 ENCOUNTER — Ambulatory Visit: Payer: Medicare Other | Admitting: Internal Medicine

## 2021-06-11 ENCOUNTER — Other Ambulatory Visit: Payer: Self-pay

## 2021-06-11 ENCOUNTER — Encounter: Payer: Self-pay | Admitting: Internal Medicine

## 2021-06-11 VITALS — BP 140/80 | HR 82 | Temp 97.8°F | Ht 71.0 in | Wt 219.8 lb

## 2021-06-11 DIAGNOSIS — J4520 Mild intermittent asthma, uncomplicated: Secondary | ICD-10-CM | POA: Diagnosis not present

## 2021-06-11 DIAGNOSIS — G4733 Obstructive sleep apnea (adult) (pediatric): Secondary | ICD-10-CM | POA: Diagnosis not present

## 2021-06-11 NOTE — Patient Instructions (Signed)
Order- DME [facility]- please replace old CPAP machine auto 8-20, mask of choice, humidifier, supplies. Please install Airview/ card ? ?Please call us if we can help ?

## 2021-06-11 NOTE — Assessment & Plan Note (Signed)
He denies any active respiratory discomfort currently without routine wheeze or cough. ?

## 2021-06-11 NOTE — Assessment & Plan Note (Signed)
Benefits from CPAP with good compliance and control. ?Plan-replace old CPAP auto 8-20 ?

## 2021-06-15 DIAGNOSIS — E119 Type 2 diabetes mellitus without complications: Secondary | ICD-10-CM | POA: Diagnosis not present

## 2021-06-15 DIAGNOSIS — Z794 Long term (current) use of insulin: Secondary | ICD-10-CM | POA: Diagnosis not present

## 2021-06-16 DIAGNOSIS — G4733 Obstructive sleep apnea (adult) (pediatric): Secondary | ICD-10-CM | POA: Diagnosis not present

## 2021-06-16 DIAGNOSIS — Z0000 Encounter for general adult medical examination without abnormal findings: Secondary | ICD-10-CM | POA: Diagnosis not present

## 2021-06-16 DIAGNOSIS — E119 Type 2 diabetes mellitus without complications: Secondary | ICD-10-CM | POA: Diagnosis not present

## 2021-06-16 DIAGNOSIS — I10 Essential (primary) hypertension: Secondary | ICD-10-CM | POA: Diagnosis not present

## 2021-06-16 DIAGNOSIS — K219 Gastro-esophageal reflux disease without esophagitis: Secondary | ICD-10-CM | POA: Diagnosis not present

## 2021-06-16 DIAGNOSIS — I700 Atherosclerosis of aorta: Secondary | ICD-10-CM | POA: Diagnosis not present

## 2021-06-24 DIAGNOSIS — F32A Depression, unspecified: Secondary | ICD-10-CM | POA: Diagnosis not present

## 2021-06-24 DIAGNOSIS — M47816 Spondylosis without myelopathy or radiculopathy, lumbar region: Secondary | ICD-10-CM | POA: Diagnosis not present

## 2021-06-24 DIAGNOSIS — G894 Chronic pain syndrome: Secondary | ICD-10-CM | POA: Diagnosis not present

## 2021-06-29 ENCOUNTER — Ambulatory Visit: Payer: Medicare Other | Admitting: Internal Medicine

## 2021-07-14 ENCOUNTER — Telehealth: Payer: Self-pay | Admitting: Internal Medicine

## 2021-07-15 DIAGNOSIS — E119 Type 2 diabetes mellitus without complications: Secondary | ICD-10-CM | POA: Diagnosis not present

## 2021-07-15 DIAGNOSIS — Z794 Long term (current) use of insulin: Secondary | ICD-10-CM | POA: Diagnosis not present

## 2021-07-22 DIAGNOSIS — F32A Depression, unspecified: Secondary | ICD-10-CM | POA: Diagnosis not present

## 2021-07-22 DIAGNOSIS — M47816 Spondylosis without myelopathy or radiculopathy, lumbar region: Secondary | ICD-10-CM | POA: Diagnosis not present

## 2021-07-22 DIAGNOSIS — G894 Chronic pain syndrome: Secondary | ICD-10-CM | POA: Diagnosis not present

## 2021-07-29 DIAGNOSIS — R1084 Generalized abdominal pain: Secondary | ICD-10-CM | POA: Diagnosis not present

## 2021-07-30 ENCOUNTER — Ambulatory Visit (INDEPENDENT_AMBULATORY_CARE_PROVIDER_SITE_OTHER): Payer: Medicare Other | Admitting: Family Medicine

## 2021-08-04 ENCOUNTER — Ambulatory Visit (INDEPENDENT_AMBULATORY_CARE_PROVIDER_SITE_OTHER): Payer: Medicare Other | Admitting: Family Medicine

## 2021-08-04 DIAGNOSIS — D3500 Benign neoplasm of unspecified adrenal gland: Secondary | ICD-10-CM | POA: Diagnosis not present

## 2021-08-04 DIAGNOSIS — N289 Disorder of kidney and ureter, unspecified: Secondary | ICD-10-CM | POA: Diagnosis not present

## 2021-08-04 DIAGNOSIS — N200 Calculus of kidney: Secondary | ICD-10-CM | POA: Diagnosis not present

## 2021-08-04 DIAGNOSIS — K4020 Bilateral inguinal hernia, without obstruction or gangrene, not specified as recurrent: Secondary | ICD-10-CM | POA: Diagnosis not present

## 2021-08-04 DIAGNOSIS — R1084 Generalized abdominal pain: Secondary | ICD-10-CM | POA: Diagnosis not present

## 2021-08-06 ENCOUNTER — Other Ambulatory Visit: Payer: Self-pay | Admitting: Urology

## 2021-08-06 DIAGNOSIS — D4102 Neoplasm of uncertain behavior of left kidney: Secondary | ICD-10-CM

## 2021-08-12 ENCOUNTER — Ambulatory Visit (INDEPENDENT_AMBULATORY_CARE_PROVIDER_SITE_OTHER): Payer: Medicare Other | Admitting: Family Medicine

## 2021-08-12 ENCOUNTER — Encounter (INDEPENDENT_AMBULATORY_CARE_PROVIDER_SITE_OTHER): Payer: Self-pay | Admitting: Family Medicine

## 2021-08-12 VITALS — BP 118/69 | HR 85 | Temp 98.1°F | Ht 70.0 in | Wt 211.0 lb

## 2021-08-12 DIAGNOSIS — M545 Low back pain, unspecified: Secondary | ICD-10-CM

## 2021-08-12 DIAGNOSIS — E669 Obesity, unspecified: Secondary | ICD-10-CM | POA: Diagnosis not present

## 2021-08-12 DIAGNOSIS — E1165 Type 2 diabetes mellitus with hyperglycemia: Secondary | ICD-10-CM | POA: Diagnosis not present

## 2021-08-12 DIAGNOSIS — Z7984 Long term (current) use of oral hypoglycemic drugs: Secondary | ICD-10-CM

## 2021-08-12 DIAGNOSIS — Z6830 Body mass index (BMI) 30.0-30.9, adult: Secondary | ICD-10-CM

## 2021-08-13 ENCOUNTER — Ambulatory Visit
Admission: RE | Admit: 2021-08-13 | Discharge: 2021-08-13 | Disposition: A | Discharge status: Home / Self Care | Payer: Medicare Other | Source: Ambulatory Visit | Attending: Urology | Admitting: Urology

## 2021-08-13 DIAGNOSIS — N2889 Other specified disorders of kidney and ureter: Secondary | ICD-10-CM | POA: Diagnosis not present

## 2021-08-13 DIAGNOSIS — Q453 Other congenital malformations of pancreas and pancreatic duct: Secondary | ICD-10-CM | POA: Diagnosis not present

## 2021-08-13 DIAGNOSIS — D3500 Benign neoplasm of unspecified adrenal gland: Secondary | ICD-10-CM | POA: Diagnosis not present

## 2021-08-13 DIAGNOSIS — K7689 Other specified diseases of liver: Secondary | ICD-10-CM | POA: Diagnosis not present

## 2021-08-13 DIAGNOSIS — D4102 Neoplasm of uncertain behavior of left kidney: Secondary | ICD-10-CM

## 2021-08-13 MED ORDER — GADOBENATE DIMEGLUMINE 529 MG/ML IV SOLN
20.0000 mL | Freq: Once | INTRAVENOUS | Status: AC | PRN
  Administered 2021-08-13: 20 mL via INTRAVENOUS

## 2021-08-13 NOTE — Telephone Encounter (Signed)
Closing encounter as it was open in error.

## 2021-08-14 DIAGNOSIS — E119 Type 2 diabetes mellitus without complications: Secondary | ICD-10-CM | POA: Diagnosis not present

## 2021-08-14 DIAGNOSIS — Z794 Long term (current) use of insulin: Secondary | ICD-10-CM | POA: Diagnosis not present

## 2021-08-17 NOTE — Progress Notes (Signed)
Chief Complaint:   OBESITY Max [name] is here to discuss his progress with his obesity treatment plan along with follow-up of his obesity related diagnoses. Max [name] is on the Category 3 Plan and states he is following his eating plan approximately 50% of the time. Max [name] states he is doing some walking.  Today's visit was #: 71 Starting weight: 273 lbs Starting date: 09/28/2017 Today's weight: 211 lbs Today's date: 08/12/2021 Total lbs lost to date: 62 lbs Total lbs lost since last in-office visit: 2 lbs  Interim History: Kendra has had a busy few weeks with familial celebrations.  He is still working with new pain management doctor.  Max [name] is wearing a back brace now. He has a few trips planned over the next few months.  Max [name] has been focusing on protein intake over the last  few weeks.  He did get a bit on a cereal run (Cheerios).  He wants to get back to the pool.   Subjective:   1. Chronic lumbar back pain Max [name] reports he is on tramadol currently.  PDMP checked and he is getting 180 tablets every 6-7 weeks.   2. Type 2 diabetes mellitus with hyperglycemia, with long-term current use of insulin (HCC) Max [name]'s blood sugars have been somewhat lower. (Highest has been 225).  Max [name] has not had to take much insulin.  He has been doing well following meal plan.    Assessment/Plan:   1. Chronic lumbar back pain Max [name] has agreed to follow up with the pain doc at next appointment.  2. Type 2 diabetes mellitus with hyperglycemia, with long-term current use of insulin (HCC) Max [name] has agreed to continue [name] and [employer].   3. Obesity with current BMI of 30.3 Max [name] is currently in the action stage of change. As such, his goal is to continue with weight loss efforts. He has agreed to the Category 3 Plan.   Exercise goals: All adults should avoid inactivity. Some physical activity is better than none, and adults who participate in any amount of physical activity  gain some health benefits.  Behavioral modification strategies: increasing lean protein intake, meal planning and cooking strategies, keeping healthy foods in the home, and planning for success.  Max [name] has agreed to follow-up with our clinic in 12 weeks. He was informed of the importance of frequent follow-up visits to maximize his success with intensive lifestyle modifications for his multiple health conditions.   Objective:   Blood pressure 118/69, pulse 85, temperature 98.1 F (36.7 C), height '5\' 10"'$  (1.778 m), weight 211 lb (95.7 kg), SpO2 97 %. Body mass index is 30.28 kg/m.  General: Cooperative, alert, well developed, in no acute distress. HEENT: Conjunctivae and lids unremarkable. Cardiovascular: Regular rhythm.  Lungs: Normal work of breathing. Neurologic: No focal deficits.   Lab Results  Component Value Date   CREATININE 0.78 01/03/2020   BUN 11 01/03/2020   NA 143 01/03/2020   K 4.4 01/03/2020   CL 102 01/03/2020   CO2 27 01/03/2020   Lab Results  Component Value Date   ALT 18 01/03/2020   AST 13 01/03/2020   ALKPHOS 99 01/03/2020   BILITOT 0.4 01/03/2020   Lab Results  Component Value Date   HGBA1C 7.9 (H) 01/03/2020   HGBA1C 7.6 (H) 12/11/2018   HGBA1C 7.7 (H) 05/15/2018   HGBA1C 8.3 (H) 09/28/2017   HGBA1C 7.2 (H) 03/09/2016   Lab Results  Component Value Date   INSULIN 8.1 01/03/2020   Lab Results  Component Value  Date   TSH 0.471 09/28/2017   Lab Results  Component Value Date   CHOL 171 01/03/2020   HDL 51 01/03/2020   LDLCALC 103 (H) 01/03/2020   TRIG 90 01/03/2020   CHOLHDL 3.8 Ratio 01/09/2008   Lab Results  Component Value Date   VD25OH 60.1 01/03/2020   VD25OH 50.1 09/28/2017   Lab Results  Component Value Date   WBC 12.0 (H) 09/27/2019   HGB 14.3 09/27/2019   HCT 43.7 09/27/2019   MCV 79.4 09/27/2019   PLT 251.0 09/27/2019   Lab Results  Component Value Date   IRON 65 09/27/2019   TIBC 309 09/27/2019   FERRITIN 123.8  09/27/2019    Attestation Statements:   Reviewed by clinician on day of visit: allergies, medications, problem list, medical history, surgical history, family history, social history, and previous encounter notes.  I, Davy Pique, RMA, am acting as transcriptionist for [name], MD.  I have reviewed the above documentation for accuracy and completeness, and I agree with the above. - [name], MD

## 2021-08-18 DIAGNOSIS — M461 Sacroiliitis, not elsewhere classified: Secondary | ICD-10-CM | POA: Diagnosis not present

## 2021-08-31 DIAGNOSIS — R590 Localized enlarged lymph nodes: Secondary | ICD-10-CM | POA: Diagnosis not present

## 2021-08-31 DIAGNOSIS — D3501 Benign neoplasm of right adrenal gland: Secondary | ICD-10-CM | POA: Diagnosis not present

## 2021-08-31 DIAGNOSIS — D3502 Benign neoplasm of left adrenal gland: Secondary | ICD-10-CM | POA: Diagnosis not present

## 2021-08-31 DIAGNOSIS — D4102 Neoplasm of uncertain behavior of left kidney: Secondary | ICD-10-CM | POA: Diagnosis not present

## 2021-09-02 DIAGNOSIS — R110 Nausea: Secondary | ICD-10-CM | POA: Diagnosis not present

## 2021-09-02 DIAGNOSIS — E785 Hyperlipidemia, unspecified: Secondary | ICD-10-CM | POA: Diagnosis not present

## 2021-09-02 DIAGNOSIS — I10 Essential (primary) hypertension: Secondary | ICD-10-CM | POA: Diagnosis not present

## 2021-10-01 ENCOUNTER — Encounter (INDEPENDENT_AMBULATORY_CARE_PROVIDER_SITE_OTHER): Payer: Self-pay | Admitting: Family Medicine

## 2021-10-01 ENCOUNTER — Ambulatory Visit (INDEPENDENT_AMBULATORY_CARE_PROVIDER_SITE_OTHER): Payer: Medicare Other | Admitting: Family Medicine

## 2021-10-01 VITALS — BP 139/61 | HR 91 | Temp 97.8°F | Ht 70.0 in | Wt 205.0 lb

## 2021-10-01 DIAGNOSIS — M545 Low back pain, unspecified: Secondary | ICD-10-CM

## 2021-10-01 DIAGNOSIS — G8929 Other chronic pain: Secondary | ICD-10-CM | POA: Diagnosis not present

## 2021-10-01 DIAGNOSIS — Z794 Long term (current) use of insulin: Secondary | ICD-10-CM | POA: Diagnosis not present

## 2021-10-01 DIAGNOSIS — E1165 Type 2 diabetes mellitus with hyperglycemia: Secondary | ICD-10-CM

## 2021-10-05 NOTE — Progress Notes (Signed)
Chief Complaint:   OBESITY Max [name] is here to discuss his progress with his obesity treatment plan along with follow-up of his obesity related diagnoses. Max [name] is on the Category 3 Plan and states he is following his eating plan approximately 60% of the time. Max [name] states he is doing some walking.  Today's visit was #: 2 Starting weight: 273 lbs Starting date: 09/28/2017 Today's weight: 205 lbs Today's date: 10/01/2021 Total lbs lost to date: 68 lbs Total lbs lost since last in-office visit: 6  Interim History: Max [name] has been having an enjoyable summer so far. He is focused on having nutritious food in the house and not bringing indulgent food around. Denies hunger. His blood sugars have been well managed.  Subjective:   1. Type 2 diabetes mellitus with hyperglycemia, with long-term current use of insulin (HCC) Max [name] is not needing any insulin. Blood sugars have all been 100-140's without hypoglycemia.  2. Chronic low back pain, unspecified back pain laterality, unspecified whether sciatica present Max [name] sees Dr. Margaretha Sheffield. He is currently taking Tramadol 50 mg but last prescription was filled 07/22/21 (this was a 30 day Rx).    Assessment/Plan:   1. Type 2 diabetes mellitus with hyperglycemia, with long-term current use of insulin (HCC) Max [name] will continue taking current medications. He will follow up on blood sugars with Dr. Buddy Duty at next appointment.  2. Chronic low back pain, unspecified back pain laterality, unspecified whether sciatica present Follow up with Dr. Greta Doom this week at his appointment.  Max [name] is currently in the action stage of change. As such, his goal is to continue with weight loss efforts. He has agreed to the Category 3 Plan.   Exercise goals: All adults should avoid inactivity. Some physical activity is better than none, and adults who participate in any amount of physical activity gain some health benefits.  Behavioral modification  strategies: increasing lean protein intake, meal planning and cooking strategies, and keeping healthy foods in the home.  Max [name] has agreed to follow-up with our clinic in 12 weeks. He was informed of the importance of frequent follow-up visits to maximize his success with intensive lifestyle modifications for his multiple health conditions.   Objective:   Blood pressure 139/61, pulse 91, temperature 97.8 F (36.6 C), height '5\' 10"'$  (1.778 m), weight 205 lb (93 kg), SpO2 98 %. Body mass index is 29.41 kg/m.  General: Cooperative, alert, well developed, in no acute distress. HEENT: Conjunctivae and lids unremarkable. Cardiovascular: Regular rhythm.  Lungs: Normal work of breathing. Neurologic: No focal deficits.   Lab Results  Component Value Date   CREATININE 0.78 01/03/2020   BUN 11 01/03/2020   NA 143 01/03/2020   K 4.4 01/03/2020   CL 102 01/03/2020   CO2 27 01/03/2020   Lab Results  Component Value Date   ALT 18 01/03/2020   AST 13 01/03/2020   ALKPHOS 99 01/03/2020   BILITOT 0.4 01/03/2020   Lab Results  Component Value Date   HGBA1C 7.9 (H) 01/03/2020   HGBA1C 7.6 (H) 12/11/2018   HGBA1C 7.7 (H) 05/15/2018   HGBA1C 8.3 (H) 09/28/2017   HGBA1C 7.2 (H) 03/09/2016   Lab Results  Component Value Date   INSULIN 8.1 01/03/2020   Lab Results  Component Value Date   TSH 0.471 09/28/2017   Lab Results  Component Value Date   CHOL 171 01/03/2020   HDL 51 01/03/2020   LDLCALC 103 (H) 01/03/2020   TRIG 90 01/03/2020   CHOLHDL 3.8  Ratio 01/09/2008   Lab Results  Component Value Date   VD25OH 60.1 01/03/2020   VD25OH 50.1 09/28/2017   Lab Results  Component Value Date   WBC 12.0 (H) 09/27/2019   HGB 14.3 09/27/2019   HCT 43.7 09/27/2019   MCV 79.4 09/27/2019   PLT 251.0 09/27/2019   Lab Results  Component Value Date   IRON 65 09/27/2019   TIBC 309 09/27/2019   FERRITIN 123.8 09/27/2019   Attestation Statements:   Reviewed by clinician on day of  visit: allergies, medications, problem list, medical history, surgical history, family history, social history, and previous encounter notes.  I, Elnora Morrison, RMA am acting as transcriptionist for [name], MD.  I have reviewed the above documentation for accuracy and completeness, and I agree with the above. - [name], MD

## 2021-10-08 DIAGNOSIS — F32A Depression, unspecified: Secondary | ICD-10-CM | POA: Diagnosis not present

## 2021-10-08 DIAGNOSIS — G894 Chronic pain syndrome: Secondary | ICD-10-CM | POA: Diagnosis not present

## 2021-10-08 DIAGNOSIS — M47816 Spondylosis without myelopathy or radiculopathy, lumbar region: Secondary | ICD-10-CM | POA: Diagnosis not present

## 2021-10-15 DIAGNOSIS — G4733 Obstructive sleep apnea (adult) (pediatric): Secondary | ICD-10-CM | POA: Diagnosis not present

## 2021-10-21 DIAGNOSIS — E119 Type 2 diabetes mellitus without complications: Secondary | ICD-10-CM | POA: Diagnosis not present

## 2021-10-21 DIAGNOSIS — Z794 Long term (current) use of insulin: Secondary | ICD-10-CM | POA: Diagnosis not present

## 2021-11-04 ENCOUNTER — Ambulatory Visit (INDEPENDENT_AMBULATORY_CARE_PROVIDER_SITE_OTHER): Payer: Medicare Other | Admitting: Family Medicine

## 2021-11-04 ENCOUNTER — Encounter (INDEPENDENT_AMBULATORY_CARE_PROVIDER_SITE_OTHER): Payer: Self-pay | Admitting: Family Medicine

## 2021-11-04 ENCOUNTER — Encounter (INDEPENDENT_AMBULATORY_CARE_PROVIDER_SITE_OTHER): Payer: Self-pay

## 2021-11-04 VITALS — BP 131/69 | HR 87 | Temp 97.8°F | Ht 70.0 in | Wt 199.0 lb

## 2021-11-04 DIAGNOSIS — E66812 Obesity, class 2: Secondary | ICD-10-CM

## 2021-11-04 DIAGNOSIS — Z6828 Body mass index (BMI) 28.0-28.9, adult: Secondary | ICD-10-CM | POA: Diagnosis not present

## 2021-11-04 DIAGNOSIS — E1165 Type 2 diabetes mellitus with hyperglycemia: Secondary | ICD-10-CM

## 2021-11-04 DIAGNOSIS — F32A Depression, unspecified: Secondary | ICD-10-CM | POA: Diagnosis not present

## 2021-11-04 DIAGNOSIS — Z794 Long term (current) use of insulin: Secondary | ICD-10-CM | POA: Diagnosis not present

## 2021-11-04 DIAGNOSIS — E669 Obesity, unspecified: Secondary | ICD-10-CM

## 2021-11-04 DIAGNOSIS — G894 Chronic pain syndrome: Secondary | ICD-10-CM | POA: Diagnosis not present

## 2021-11-04 DIAGNOSIS — M47816 Spondylosis without myelopathy or radiculopathy, lumbar region: Secondary | ICD-10-CM | POA: Diagnosis not present

## 2021-11-09 NOTE — Progress Notes (Signed)
Chief Complaint:   OBESITY Max [name] is here to discuss his progress with his obesity treatment plan along with follow-up of his obesity related diagnoses. Max [name] is on the Category 3 Plan and states he is following his eating plan approximately 100% of the time. Max [name] states he is swimming and walking 60/60 minutes 7 times per week.  Today's visit was #: 85 Starting weight: 273 lbs Starting date: 09/28/2017 Today's weight: 199 lbs Today's date: 11/04/2021 Total lbs lost to date: 74 lbs Total lbs lost since last in-office visit: 6  Interim History: Max [name] returned to clinic for 1st time since 10/01/21. He just got diagnosed with kidney stones today (referred to Urology). He has tried to stay mindful of food choices. Breakfast is at 2pm and it is a bowl of cereal (Cheerios with skim milk). Skipping lunch daily but may eat avocado in between. Supper is protein and vegetables. He is not doing much snacking.  Subjective:   1. Type 2 diabetes mellitus with hyperglycemia, with long-term current use of insulin (HCC) Max [name] has Shields today, blood sugars 59-389. Consistent elevations mid day--not surprising due to cereal intake in am.  Assessment/Plan:   1. Type 2 diabetes mellitus with hyperglycemia, with long-term current use of insulin (HCC) Max [name] will continue current medications without changes in dose. Protein cereal and milk options provided.  2. Obesity with current BMI of 28.6 Max [name] is currently in the action stage of change. As such, his goal is to continue with weight loss efforts. He has agreed to the Category 3 Plan.   Exercise goals: As is.  Behavioral modification strategies: increasing lean protein intake, no skipping meals, meal planning and cooking strategies, keeping healthy foods in the home, and planning for success.  Max [name] has agreed to follow-up with our clinic in 8 weeks. He was informed of the importance of frequent follow-up visits to maximize his  success with intensive lifestyle modifications for his multiple health conditions.   Objective:   Blood pressure 131/69, pulse 87, temperature 97.8 F (36.6 C), height '5\' 10"'$  (1.778 m), weight 199 lb (90.3 kg), SpO2 99 %. Body mass index is 28.55 kg/m.  General: Cooperative, alert, well developed, in no acute distress. HEENT: Conjunctivae and lids unremarkable. Cardiovascular: Regular rhythm.  Lungs: Normal work of breathing. Neurologic: No focal deficits.   Lab Results  Component Value Date   CREATININE 0.78 01/03/2020   BUN 11 01/03/2020   NA 143 01/03/2020   K 4.4 01/03/2020   CL 102 01/03/2020   CO2 27 01/03/2020   Lab Results  Component Value Date   ALT 18 01/03/2020   AST 13 01/03/2020   ALKPHOS 99 01/03/2020   BILITOT 0.4 01/03/2020   Lab Results  Component Value Date   HGBA1C 7.9 (H) 01/03/2020   HGBA1C 7.6 (H) 12/11/2018   HGBA1C 7.7 (H) 05/15/2018   HGBA1C 8.3 (H) 09/28/2017   HGBA1C 7.2 (H) 03/09/2016   Lab Results  Component Value Date   INSULIN 8.1 01/03/2020   Lab Results  Component Value Date   TSH 0.471 09/28/2017   Lab Results  Component Value Date   CHOL 171 01/03/2020   HDL 51 01/03/2020   LDLCALC 103 (H) 01/03/2020   TRIG 90 01/03/2020   CHOLHDL 3.8 Ratio 01/09/2008   Lab Results  Component Value Date   VD25OH 60.1 01/03/2020   VD25OH 50.1 09/28/2017   Lab Results  Component Value Date   WBC 12.0 (H) 09/27/2019   HGB 14.3 09/27/2019  HCT 43.7 09/27/2019   MCV 79.4 09/27/2019   PLT 251.0 09/27/2019   Lab Results  Component Value Date   IRON 65 09/27/2019   TIBC 309 09/27/2019   FERRITIN 123.8 09/27/2019   Attestation Statements:   Reviewed by clinician on day of visit: allergies, medications, problem list, medical history, surgical history, family history, social history, and previous encounter notes.  I, Elnora Morrison, RMA am acting as transcriptionist for [name], MD.  I have reviewed the above  documentation for accuracy and completeness, and I agree with the above. - [name], MD

## 2021-11-12 ENCOUNTER — Ambulatory Visit (INDEPENDENT_AMBULATORY_CARE_PROVIDER_SITE_OTHER): Payer: Medicare Other | Admitting: Family Medicine

## 2021-11-20 DIAGNOSIS — Z794 Long term (current) use of insulin: Secondary | ICD-10-CM | POA: Diagnosis not present

## 2021-11-20 DIAGNOSIS — E119 Type 2 diabetes mellitus without complications: Secondary | ICD-10-CM | POA: Diagnosis not present

## 2021-12-03 DIAGNOSIS — R599 Enlarged lymph nodes, unspecified: Secondary | ICD-10-CM | POA: Diagnosis not present

## 2021-12-03 DIAGNOSIS — F32A Depression, unspecified: Secondary | ICD-10-CM | POA: Diagnosis not present

## 2021-12-03 DIAGNOSIS — G894 Chronic pain syndrome: Secondary | ICD-10-CM | POA: Diagnosis not present

## 2021-12-03 DIAGNOSIS — M47816 Spondylosis without myelopathy or radiculopathy, lumbar region: Secondary | ICD-10-CM | POA: Diagnosis not present

## 2021-12-03 DIAGNOSIS — Z79891 Long term (current) use of opiate analgesic: Secondary | ICD-10-CM | POA: Diagnosis not present

## 2021-12-03 DIAGNOSIS — D49512 Neoplasm of unspecified behavior of left kidney: Secondary | ICD-10-CM | POA: Diagnosis not present

## 2021-12-20 DIAGNOSIS — Z794 Long term (current) use of insulin: Secondary | ICD-10-CM | POA: Diagnosis not present

## 2021-12-20 DIAGNOSIS — E119 Type 2 diabetes mellitus without complications: Secondary | ICD-10-CM | POA: Diagnosis not present

## 2021-12-30 ENCOUNTER — Ambulatory Visit (INDEPENDENT_AMBULATORY_CARE_PROVIDER_SITE_OTHER): Payer: Medicare Other | Admitting: Family Medicine

## 2021-12-30 ENCOUNTER — Encounter (INDEPENDENT_AMBULATORY_CARE_PROVIDER_SITE_OTHER): Payer: Self-pay | Admitting: Family Medicine

## 2021-12-30 VITALS — BP 172/60 | HR 52 | Temp 98.2°F | Ht 70.0 in | Wt 196.0 lb

## 2021-12-30 DIAGNOSIS — I152 Hypertension secondary to endocrine disorders: Secondary | ICD-10-CM

## 2021-12-30 DIAGNOSIS — E669 Obesity, unspecified: Secondary | ICD-10-CM | POA: Diagnosis not present

## 2021-12-30 DIAGNOSIS — Z794 Long term (current) use of insulin: Secondary | ICD-10-CM

## 2021-12-30 DIAGNOSIS — E1165 Type 2 diabetes mellitus with hyperglycemia: Secondary | ICD-10-CM

## 2021-12-30 DIAGNOSIS — E1159 Type 2 diabetes mellitus with other circulatory complications: Secondary | ICD-10-CM | POA: Diagnosis not present

## 2021-12-30 DIAGNOSIS — Z6828 Body mass index (BMI) 28.0-28.9, adult: Secondary | ICD-10-CM

## 2021-12-31 NOTE — Progress Notes (Signed)
Chief Complaint:   OBESITY Max [name] is here to discuss his progress with his obesity treatment plan along with follow-up of his obesity related diagnoses. Max [name] is on the Category 3 Plan and states he is following his eating plan approximately 100% of the time. Max [name] states he is exercising 0 minutes 0 times per week.  Today's visit was #: 15 Starting weight: 273 lbs Starting date: 09/28/2017 Today's weight: 196 lb Today's date: 12/30/2021 Total lbs lost to date: 77 lbs Total lbs lost since last in-office visit: 3  Interim History: Max [name] has had a significant amount of pain in the last few weeks. Wife is present for appointment. Both voice he is getting all food in over the day. No hunger. Celebrated his birthday going out to dinner. Max [name] in significant pain and just changed from Morphine Hydrocodone.  Subjective:   1. Hypertension associated with diabetes ([place]) Endre is on Norvasc, Cozaar. Blood pressure elevated today but he is in a significant amount of pain and prior. Blood pressure within normal limits for many prior appointments.  2. Type 2 diabetes mellitus with hyperglycemia, with long-term current use of insulin (HCC) Unfortunately does not have blood sugar log today. Max [name] denies any low blood sugars. Sees Dr. Buddy Duty of endocrine.  Assessment/Plan:   1. Hypertension associated with diabetes ([place]) Follow up with blood pressure at next appointment--no changes in medications.  2. Type 2 diabetes mellitus with hyperglycemia, with long-term current use of insulin ([place]) Yakov to bring blood sugar log to next appointment.  3. Obesity with current BMI of 28.2 Carey is currently in the action stage of change. As such, his goal is to continue with weight loss efforts. He has agreed to the Category 3 Plan.   Exercise goals: All adults should avoid inactivity. Some physical activity is better than none, and adults who participate in any amount of physical activity  gain some health benefits.  Behavioral modification strategies: increasing lean protein intake, meal planning and cooking strategies, keeping healthy foods in the home, and planning for success.  Max [name] has agreed to follow-up with our clinic in 12 weeks. He was informed of the importance of frequent follow-up visits to maximize his success with intensive lifestyle modifications for his multiple health conditions.   Objective:   Blood pressure (!) 172/60, pulse (!) 52, temperature 98.2 F (36.8 C), height '5\' 10"'$  (1.778 m), weight 196 lb (88.9 kg), SpO2 97 %. Body mass index is 28.12 kg/m.  General: Cooperative, alert, well developed, in no acute distress. HEENT: Conjunctivae and lids unremarkable. Cardiovascular: Regular rhythm.  Lungs: Normal work of breathing. Neurologic: No focal deficits.   Lab Results  Component Value Date   CREATININE 0.78 01/03/2020   BUN 11 01/03/2020   NA 143 01/03/2020   K 4.4 01/03/2020   CL 102 01/03/2020   CO2 27 01/03/2020   Lab Results  Component Value Date   ALT 18 01/03/2020   AST 13 01/03/2020   ALKPHOS 99 01/03/2020   BILITOT 0.4 01/03/2020   Lab Results  Component Value Date   HGBA1C 7.9 (H) 01/03/2020   HGBA1C 7.6 (H) 12/11/2018   HGBA1C 7.7 (H) 05/15/2018   HGBA1C 8.3 (H) 09/28/2017   HGBA1C 7.2 (H) 03/09/2016   Lab Results  Component Value Date   INSULIN 8.1 01/03/2020   Lab Results  Component Value Date   TSH 0.471 09/28/2017   Lab Results  Component Value Date   CHOL 171 01/03/2020   HDL 51 01/03/2020  LDLCALC 103 (H) 01/03/2020   TRIG 90 01/03/2020   CHOLHDL 3.8 Ratio 01/09/2008   Lab Results  Component Value Date   VD25OH 60.1 01/03/2020   VD25OH 50.1 09/28/2017   Lab Results  Component Value Date   WBC 12.0 (H) 09/27/2019   HGB 14.3 09/27/2019   HCT 43.7 09/27/2019   MCV 79.4 09/27/2019   PLT 251.0 09/27/2019   Lab Results  Component Value Date   IRON 65 09/27/2019   TIBC 309 09/27/2019    FERRITIN 123.8 09/27/2019   Attestation Statements:   Reviewed by clinician on day of visit: allergies, medications, problem list, medical history, surgical history, family history, social history, and previous encounter notes.  I, Elnora Morrison, RMA am acting as transcriptionist for [name], MD. I have reviewed the above documentation for accuracy and completeness, and I agree with the above. - [name], MD

## 2022-01-12 DIAGNOSIS — G894 Chronic pain syndrome: Secondary | ICD-10-CM | POA: Diagnosis not present

## 2022-01-12 DIAGNOSIS — M47816 Spondylosis without myelopathy or radiculopathy, lumbar region: Secondary | ICD-10-CM | POA: Diagnosis not present

## 2022-01-12 DIAGNOSIS — F32A Depression, unspecified: Secondary | ICD-10-CM | POA: Diagnosis not present

## 2022-01-13 DIAGNOSIS — G4733 Obstructive sleep apnea (adult) (pediatric): Secondary | ICD-10-CM | POA: Diagnosis not present

## 2022-01-25 DIAGNOSIS — D49512 Neoplasm of unspecified behavior of left kidney: Secondary | ICD-10-CM | POA: Diagnosis not present

## 2022-01-27 ENCOUNTER — Other Ambulatory Visit (HOSPITAL_COMMUNITY): Payer: Self-pay | Admitting: Urology

## 2022-01-27 ENCOUNTER — Other Ambulatory Visit: Payer: Self-pay | Admitting: Urology

## 2022-01-27 DIAGNOSIS — D4112 Neoplasm of uncertain behavior of left renal pelvis: Secondary | ICD-10-CM

## 2022-01-28 ENCOUNTER — Other Ambulatory Visit: Payer: Self-pay | Admitting: Urology

## 2022-01-28 DIAGNOSIS — N2889 Other specified disorders of kidney and ureter: Secondary | ICD-10-CM

## 2022-01-28 NOTE — Progress Notes (Signed)
Suttle, Rosanne Ashing, MD  Donita Brooks D Reaching out to provider.

## 2022-01-28 NOTE — Progress Notes (Signed)
Suttle, Rosanne Ashing, MD  Donita Brooks D Approved for ultrasound guided renal mass biopsy.  Dylan

## 2022-02-02 DIAGNOSIS — D3502 Benign neoplasm of left adrenal gland: Secondary | ICD-10-CM | POA: Diagnosis not present

## 2022-02-02 DIAGNOSIS — N200 Calculus of kidney: Secondary | ICD-10-CM | POA: Diagnosis not present

## 2022-02-02 DIAGNOSIS — D3501 Benign neoplasm of right adrenal gland: Secondary | ICD-10-CM | POA: Diagnosis not present

## 2022-02-02 DIAGNOSIS — N289 Disorder of kidney and ureter, unspecified: Secondary | ICD-10-CM | POA: Diagnosis not present

## 2022-02-02 DIAGNOSIS — D49512 Neoplasm of unspecified behavior of left kidney: Secondary | ICD-10-CM | POA: Diagnosis not present

## 2022-02-03 DIAGNOSIS — E1142 Type 2 diabetes mellitus with diabetic polyneuropathy: Secondary | ICD-10-CM | POA: Diagnosis not present

## 2022-02-03 DIAGNOSIS — E0590 Thyrotoxicosis, unspecified without thyrotoxic crisis or storm: Secondary | ICD-10-CM | POA: Diagnosis not present

## 2022-02-03 DIAGNOSIS — H9190 Unspecified hearing loss, unspecified ear: Secondary | ICD-10-CM | POA: Diagnosis not present

## 2022-02-03 DIAGNOSIS — H9319 Tinnitus, unspecified ear: Secondary | ICD-10-CM | POA: Diagnosis not present

## 2022-02-03 DIAGNOSIS — E1169 Type 2 diabetes mellitus with other specified complication: Secondary | ICD-10-CM | POA: Diagnosis not present

## 2022-02-03 DIAGNOSIS — E1165 Type 2 diabetes mellitus with hyperglycemia: Secondary | ICD-10-CM | POA: Diagnosis not present

## 2022-02-09 DIAGNOSIS — Z794 Long term (current) use of insulin: Secondary | ICD-10-CM | POA: Diagnosis not present

## 2022-02-09 DIAGNOSIS — E119 Type 2 diabetes mellitus without complications: Secondary | ICD-10-CM | POA: Diagnosis not present

## 2022-02-10 DIAGNOSIS — G894 Chronic pain syndrome: Secondary | ICD-10-CM | POA: Diagnosis not present

## 2022-02-10 DIAGNOSIS — M47816 Spondylosis without myelopathy or radiculopathy, lumbar region: Secondary | ICD-10-CM | POA: Diagnosis not present

## 2022-02-10 DIAGNOSIS — F32A Depression, unspecified: Secondary | ICD-10-CM | POA: Diagnosis not present

## 2022-02-16 ENCOUNTER — Other Ambulatory Visit: Payer: Self-pay | Admitting: Radiology

## 2022-02-16 DIAGNOSIS — N2889 Other specified disorders of kidney and ureter: Secondary | ICD-10-CM

## 2022-02-16 NOTE — H&P (Signed)
Chief Complaint: Patient was seen in consultation today for left renal mass biopsy.  Referring Physician(s): [name],Max Marjory Lies  Supervising Physician: Ruthann Cancer  Patient Status: [facility] - Out-pt  History of Present Illness: Max [name] is a 76 y.o. male with a past medical history significant for ADD, anxiety, Bipolar 1 disorder, depression, OSA, chronic pain, GERD, HTN, HLD, DM, prostate cancer who presents today for a left renal mass biopsy. Max [name] is followed by [facility] due to a history of prostate cancer. He underwent MRI abdomen w/ and w/o contrast on 08/14/21 and was found to have a complex lower pole left renal lesion favoring renal cell carcinoma however complex cyst could not be excluded. He has been referred to IR for biopsy of this lesion to further direct care.  Past Medical History:  Diagnosis Date   ADD (attention deficit disorder)    Allergy    hayfever   Anxiety    Back pain    Bipolar 1 disorder (HCC)    Cataract    right eye-removed   Chronic pain    Constipation    Decreased hearing    Depression    Diabetes mellitus without complication ([place])    Type II   Dry skin    DVT (deep venous thrombosis) ([place]) 2004   leg (not sure which leg)   Dyslipidemia    Dyspnea    with exertion   Ear drainage    Excessive hunger    Excessive thirst    Eye pain    Fatigue    Frequent urination    GERD (gastroesophageal reflux disease)    Hard of hearing    Hyperlipidemia    Hypertension    Itching    Joint pain    Lactose intolerance    Leg cramps    Mouth sores    Nasal and sinus discharge    Nausea    Numbness and tingling    bilateral   OSA (obstructive sleep apnea)    wears CPAP   Shortness of breath    Shortness of breath on exertion    Sinus congestion    Sleep apnea    uses cpap   Stiff neck    Stress    Swallowing difficulty    Swelling of extremity    Tinnitus    Trouble in sleeping    Urinary frequency     Vision changes    Weakness    Wears glasses     Past Surgical History:  Procedure Laterality Date   arm surgery Right    BACK SURGERY  2004   COLONOSCOPY     ESOPHAGOGASTRODUODENOSCOPY     EYE SURGERY Right    cataract   SUBMANDIBULAR GLAND EXCISION Right 03/10/2016   Procedure: INTRAORAL  SUBMANDIBULAR  STONE REMOVAL;  Surgeon: Izora Gala, MD;  Location: [facility];  Service: ENT;  Laterality: Right;    Allergies: Baclofen, Butane, Other, Topiramate, Blue dyes (parenteral), Gabapentin, Hydromorphone, Ketorolac, Poractant alfa, Red dye, Tizanidine, Buprenorphine, Pork-derived products, Latex, and Lisinopril  Medications: Prior to Admission medications   Medication Sig Start Date End Date Taking? Authorizing Provider  amLODipine (NORVASC) 10 MG tablet Take 10 mg by mouth daily.  06/29/12  Yes [provider]  Coenzyme Q10 (COQ10) 100 MG CAPS Take 100 mg by mouth daily.   Yes [provider]  DULoxetine (CYMBALTA) 30 MG capsule Take 30 mg by mouth daily.   Yes [provider]  HYDROcodone-acetaminophen (NORCO/VICODIN) 5-325  MG tablet Take 1 tablet by mouth 3 (three) times daily as needed for severe pain. 12/08/21  Yes [provider]  OVER THE COUNTER MEDICATION Take 1 tablet by mouth daily. Factor Focus   Yes [provider]  OVER THE COUNTER MEDICATION Take 1 tablet by mouth daily. Neuriva   Yes [provider]  OVER THE COUNTER MEDICATION OMEGA XL   Yes [provider]  rosuvastatin (CRESTOR) 10 MG tablet Take 10 mg by mouth daily. 10/20/21  Yes [provider]  Selenium 200 MCG CAPS Take by mouth.   Yes [provider]  tiZANidine (ZANAFLEX) 2 MG tablet Take 2 mg by mouth every 8 (eight) hours as needed for muscle spasms. 06/05/19  Yes [provider]  AMBULATORY NON FORMULARY MEDICATION Medication Name: maltrexazone 1 mcg at bedtime    [provider]  Blood Glucose Monitoring Suppl (ONETOUCH  VERIO) w/Device KIT 1 kit by Does not apply route daily at 12 noon. Continuous Glucose Monitoring Kit 11/15/17   Laqueta Linden, MD  Elastic Bandages & Supports (LUMBAR BACK BRACE/SUPPORT PAD) MISC by Does not apply route. 01/17/18   [provider]  glucose blood (ACCU-CHEK AVIVA) test strip Use as instructed 10/13/17   Laqueta Linden, MD  Lancets (ACCU-CHEK SAFE-T PRO) lancets Use as instructed 10/13/17   Laqueta Linden, MD     Family History  Problem Relation Age of Onset   Diabetes Mother    Hypertension Mother    Hyperlipidemia Mother    Kidney disease Mother    Cancer Mother    Breast cancer Mother    Diabetes Brother    Breast cancer Sister    Breast cancer Sister    Colon cancer Neg Hx    Colon polyps Neg Hx    Esophageal cancer Neg Hx    Rectal cancer Neg Hx    Stomach cancer Neg Hx     Social History   Socioeconomic History   Marital status: Married    Spouse name: Banker   Number of children: Not on file   Years of education: Not on file   Highest education level: Not on file  Occupational History   Occupation: Disabled  Tobacco Use   Smoking status: Never   Smokeless tobacco: Never  Vaping Use   Vaping Use: Never used  Substance and Sexual Activity   Alcohol use: No   Drug use: No   Sexual activity: Not on file  Other Topics Concern   Not on file  Social History Narrative   Not on file   Social Determinants of Health   Financial Resource Strain: Not on file  Food Insecurity: Not on file  Transportation Needs: Not on file  Physical Activity: Not on file  Stress: Not on file  Social Connections: Not on file     Review of Systems: A 12 point ROS discussed and pertinent positives are indicated in the HPI above.  All other systems are negative.  Review of Systems  Constitutional:  Negative for chills and fever.  Respiratory:  Negative for cough and shortness of breath.   Cardiovascular:  Negative for chest pain.   Gastrointestinal:  Negative for abdominal pain, diarrhea, nausea and vomiting.  Musculoskeletal:  Negative for back pain.  Neurological:  Negative for dizziness and headaches.    Vital Signs: There were no vitals taken for this visit.  Physical Exam Vitals reviewed.  Constitutional:      General: He is not in  acute distress. HENT:     Head: Normocephalic.     Mouth/Throat:     Mouth: Mucous membranes are moist.     Pharynx: Oropharynx is clear. No oropharyngeal exudate or posterior oropharyngeal erythema.  Cardiovascular:     Rate and Rhythm: Normal rate and regular rhythm.  Pulmonary:     Effort: Pulmonary effort is normal.     Breath sounds: Normal breath sounds.  Abdominal:     General: There is no distension.     Palpations: Abdomen is soft.     Tenderness: There is no abdominal tenderness.  Skin:    General: Skin is warm and dry.  Neurological:     Mental Status: He is alert and oriented to person, place, and time.  Psychiatric:        Mood and Affect: Mood normal.        Behavior: Behavior normal.        Thought Content: Thought content normal.        Judgment: Judgment normal.          Imaging: No results found.  Labs:  CBC: No results for input(s): "WBC", "HGB", "HCT", "PLT" in the last 8760 hours.  COAGS: No results for input(s): "INR", "APTT" in the last 8760 hours.  BMP: No results for input(s): "NA", "K", "CL", "CO2", "GLUCOSE", "BUN", "CALCIUM", "CREATININE", "GFRNONAA", "GFRAA" in the last 8760 hours.  Invalid input(s): "CMP"  LIVER FUNCTION TESTS: No results for input(s): "BILITOT", "AST", "ALT", "ALKPHOS", "PROT", "ALBUMIN" in the last 8760 hours.  TUMOR MARKERS: No results for input(s): "AFPTM", "CEA", "CA199", "CHROMGRNA" in the last 8760 hours.  Assessment and Plan:  76 y/o M with history of prostate cancer and recently noted left renal mass who presents today for left renal mass biopsy to further direct care.  Risks and  benefits of renal mass biopsy was discussed with the patient and/or patient's family including, but not limited to bleeding, infection, damage to adjacent structures or low yield requiring additional tests.  All of the questions were answered and there is agreement to proceed.  Consent signed and in chart.  Thank you for this interesting consult.  I greatly enjoyed meeting Max [name] and look forward to participating in their care.  A copy of this report was sent to the requesting provider on this date.  Electronically Signed: Joaquim Nam, PA-C 02/16/2022, 12:35 PM   I spent a total of  30 Minutes  in face to face in clinical consultation, greater than 50% of which was counseling/coordinating care for left renal mass biopsy.

## 2022-02-17 ENCOUNTER — Ambulatory Visit (HOSPITAL_COMMUNITY)
Admission: RE | Admit: 2022-02-17 | Discharge: 2022-02-17 | Disposition: A | Discharge status: Home / Self Care | Payer: Medicare Other | Source: Ambulatory Visit | Attending: Urology | Admitting: Urology

## 2022-02-17 ENCOUNTER — Other Ambulatory Visit: Payer: Self-pay

## 2022-02-17 DIAGNOSIS — E119 Type 2 diabetes mellitus without complications: Secondary | ICD-10-CM | POA: Diagnosis not present

## 2022-02-17 DIAGNOSIS — N2889 Other specified disorders of kidney and ureter: Secondary | ICD-10-CM

## 2022-02-17 DIAGNOSIS — Z794 Long term (current) use of insulin: Secondary | ICD-10-CM | POA: Diagnosis not present

## 2022-02-17 DIAGNOSIS — C649 Malignant neoplasm of unspecified kidney, except renal pelvis: Secondary | ICD-10-CM | POA: Diagnosis not present

## 2022-02-17 DIAGNOSIS — D49512 Neoplasm of unspecified behavior of left kidney: Secondary | ICD-10-CM | POA: Diagnosis present

## 2022-02-17 DIAGNOSIS — F419 Anxiety disorder, unspecified: Secondary | ICD-10-CM | POA: Insufficient documentation

## 2022-02-17 DIAGNOSIS — F319 Bipolar disorder, unspecified: Secondary | ICD-10-CM | POA: Insufficient documentation

## 2022-02-17 DIAGNOSIS — E785 Hyperlipidemia, unspecified: Secondary | ICD-10-CM | POA: Diagnosis not present

## 2022-02-17 DIAGNOSIS — Z8546 Personal history of malignant neoplasm of prostate: Secondary | ICD-10-CM | POA: Insufficient documentation

## 2022-02-17 DIAGNOSIS — I10 Essential (primary) hypertension: Secondary | ICD-10-CM | POA: Diagnosis not present

## 2022-02-17 DIAGNOSIS — C642 Malignant neoplasm of left kidney, except renal pelvis: Secondary | ICD-10-CM | POA: Diagnosis not present

## 2022-02-17 DIAGNOSIS — G4733 Obstructive sleep apnea (adult) (pediatric): Secondary | ICD-10-CM | POA: Diagnosis not present

## 2022-02-17 LAB — CBC
HCT: 45.4 % (ref 39.0–52.0)
Hemoglobin: 15.1 g/dL (ref 13.0–17.0)
MCH: 26.5 pg (ref 26.0–34.0)
MCHC: 33.3 g/dL (ref 30.0–36.0)
MCV: 79.8 fL — ABNORMAL LOW (ref 80.0–100.0)
Platelets: 194 10*3/uL (ref 150–400)
RBC: 5.69 MIL/uL (ref 4.22–5.81)
RDW: 13.6 % (ref 11.5–15.5)
WBC: 9.3 10*3/uL (ref 4.0–10.5)
nRBC: 0 % (ref 0.0–0.2)

## 2022-02-17 LAB — PROTIME-INR
INR: 1 (ref 0.8–1.2)
Prothrombin Time: 13.4 seconds (ref 11.4–15.2)

## 2022-02-17 LAB — GLUCOSE, CAPILLARY: Glucose-Capillary: 206 mg/dL — ABNORMAL HIGH (ref 70–99)

## 2022-02-17 MED ORDER — MIDAZOLAM HCL 2 MG/2ML IJ SOLN
INTRAMUSCULAR | Status: AC | PRN
  Administered 2022-02-17: .5 mg via INTRAVENOUS
  Administered 2022-02-17: 1 mg via INTRAVENOUS

## 2022-02-17 MED ORDER — SODIUM CHLORIDE 0.9 % IV SOLN: INTRAVENOUS | Status: DC

## 2022-02-17 MED ORDER — LIDOCAINE HCL (PF) 1 % IJ SOLN
INTRAMUSCULAR | Status: AC
  Filled 2022-02-17: qty 30

## 2022-02-17 MED ORDER — HYDRALAZINE HCL 20 MG/ML IJ SOLN
INTRAMUSCULAR | Status: AC
  Filled 2022-02-17: qty 1

## 2022-02-17 MED ORDER — LIDOCAINE HCL (PF) 1 % IJ SOLN: 5.0000 mL | Freq: Once | INTRAMUSCULAR | Status: DC

## 2022-02-17 MED ORDER — HYDRALAZINE HCL 20 MG/ML IJ SOLN
INTRAMUSCULAR | Status: AC | PRN
  Administered 2022-02-17: 10 mg via INTRAVENOUS

## 2022-02-17 MED ORDER — GELATIN ABSORBABLE 12-7 MM EX MISC: 1.0000 | Freq: Once | CUTANEOUS | Status: DC

## 2022-02-17 MED ORDER — MIDAZOLAM HCL 2 MG/2ML IJ SOLN
INTRAMUSCULAR | Status: AC
  Filled 2022-02-17: qty 2

## 2022-02-17 MED ORDER — FENTANYL CITRATE (PF) 100 MCG/2ML IJ SOLN
INTRAMUSCULAR | Status: AC | PRN
  Administered 2022-02-17: 50 ug via INTRAVENOUS
  Administered 2022-02-17: 25 ug via INTRAVENOUS

## 2022-02-17 MED ORDER — GELATIN ABSORBABLE 12-7 MM EX MISC
CUTANEOUS | Status: AC
  Filled 2022-02-17: qty 1

## 2022-02-17 MED ORDER — FENTANYL CITRATE (PF) 100 MCG/2ML IJ SOLN
INTRAMUSCULAR | Status: AC
  Filled 2022-02-17: qty 2

## 2022-02-17 NOTE — Progress Notes (Signed)
Pt s/p renal biopsy in radiology. Pt received both fentanyl and versed. Pt instructed to tell nursing staff when his wife was out front with the car so staff could take him out to the car in a wheelchair. Wife could not find the car and pt became agitated at the nurses station and stated "Im leaving now" and walked out the short stay doors into the lobby. Pt refused wheelchair.

## 2022-02-17 NOTE — Procedures (Signed)
Pre Procedure Dx: Left renal lesion Post Procedural Dx: Same  Technically successful US guided biopsy of indeterminate left renal lesion.  EBL: None  No immediate complications.   Ronny Bacon, MD Pager #: [phone]

## 2022-02-22 LAB — SURGICAL PATHOLOGY

## 2022-03-05 DIAGNOSIS — G8929 Other chronic pain: Secondary | ICD-10-CM | POA: Diagnosis not present

## 2022-03-05 DIAGNOSIS — I10 Essential (primary) hypertension: Secondary | ICD-10-CM | POA: Diagnosis not present

## 2022-03-05 DIAGNOSIS — E785 Hyperlipidemia, unspecified: Secondary | ICD-10-CM | POA: Diagnosis not present

## 2022-03-05 DIAGNOSIS — M549 Dorsalgia, unspecified: Secondary | ICD-10-CM | POA: Diagnosis not present

## 2022-03-05 DIAGNOSIS — Z0000 Encounter for general adult medical examination without abnormal findings: Secondary | ICD-10-CM | POA: Diagnosis not present

## 2022-03-05 DIAGNOSIS — E1169 Type 2 diabetes mellitus with other specified complication: Secondary | ICD-10-CM | POA: Diagnosis not present

## 2022-03-05 DIAGNOSIS — G4733 Obstructive sleep apnea (adult) (pediatric): Secondary | ICD-10-CM | POA: Diagnosis not present

## 2022-03-11 DIAGNOSIS — G894 Chronic pain syndrome: Secondary | ICD-10-CM | POA: Diagnosis not present

## 2022-03-11 DIAGNOSIS — F32A Depression, unspecified: Secondary | ICD-10-CM | POA: Diagnosis not present

## 2022-03-11 DIAGNOSIS — Z794 Long term (current) use of insulin: Secondary | ICD-10-CM | POA: Diagnosis not present

## 2022-03-11 DIAGNOSIS — M47816 Spondylosis without myelopathy or radiculopathy, lumbar region: Secondary | ICD-10-CM | POA: Diagnosis not present

## 2022-03-11 DIAGNOSIS — Z79891 Long term (current) use of opiate analgesic: Secondary | ICD-10-CM | POA: Diagnosis not present

## 2022-03-11 DIAGNOSIS — E119 Type 2 diabetes mellitus without complications: Secondary | ICD-10-CM | POA: Diagnosis not present

## 2022-03-30 ENCOUNTER — Ambulatory Visit (INDEPENDENT_AMBULATORY_CARE_PROVIDER_SITE_OTHER): Payer: Medicare Other | Admitting: Family Medicine

## 2022-03-30 ENCOUNTER — Encounter (INDEPENDENT_AMBULATORY_CARE_PROVIDER_SITE_OTHER): Payer: Self-pay | Admitting: Family Medicine

## 2022-03-30 VITALS — BP 162/86 | HR 82 | Temp 97.6°F | Ht 70.0 in | Wt 206.0 lb

## 2022-03-30 DIAGNOSIS — E669 Obesity, unspecified: Secondary | ICD-10-CM | POA: Diagnosis not present

## 2022-03-30 DIAGNOSIS — C649 Malignant neoplasm of unspecified kidney, except renal pelvis: Secondary | ICD-10-CM

## 2022-03-30 DIAGNOSIS — Z6829 Body mass index (BMI) 29.0-29.9, adult: Secondary | ICD-10-CM | POA: Diagnosis not present

## 2022-04-01 DIAGNOSIS — M47816 Spondylosis without myelopathy or radiculopathy, lumbar region: Secondary | ICD-10-CM | POA: Diagnosis not present

## 2022-04-01 DIAGNOSIS — G894 Chronic pain syndrome: Secondary | ICD-10-CM | POA: Diagnosis not present

## 2022-04-01 DIAGNOSIS — F32A Depression, unspecified: Secondary | ICD-10-CM | POA: Diagnosis not present

## 2022-04-09 DIAGNOSIS — G4733 Obstructive sleep apnea (adult) (pediatric): Secondary | ICD-10-CM | POA: Diagnosis not present

## 2022-04-09 NOTE — Progress Notes (Signed)
Chief Complaint:   OBESITY Max [name] is here to discuss his progress with his obesity treatment plan along with follow-up of his obesity related diagnoses. Max [name] is on the Category 3 Plan and states he is following his eating plan approximately 60-70% of the time. Max [name] states he is exercising 0 minutes 0 times per week.  Today's visit was #: 51 Starting weight: 273 lbs Starting date: 09/28/2017 Today's weight: 206 lbs Today's date: 03/30/2022 Total lbs lost to date: 67 lbs Total lbs lost since last in-office visit: 0  Interim History: Max [name] had a very good holiday season where he stayed home.  Try to stay conscious of food choices but does not think he will restart any meal plan in particular.  No upcoming plans.  Subjective:   1. Renal cell carcinoma, unspecified laterality ([place]) Recent biopsy showing stage II RCC.  No follow-up noted and unfortunately Max [name] was not even aware of results.  Previously saw urology but does not have a urologist now.  Assessment/Plan:   1. Renal cell carcinoma, unspecified laterality ([place]) Will call Dr. Nancy Fetter.  She will refer to [facility].  2. Obesity with current BMI of 29.6 Max [name] is currently in the action stage of change. As such, his goal is to continue with weight loss efforts. He has agreed to the Category 3 Plan.   Max [name] is to recommit to category 3.  Exercise goals: Older adults should follow the adult guidelines. When older adults cannot meet the adult guidelines, they should be as physically active as their abilities and conditions will allow.   Behavioral modification strategies: increasing lean protein intake, meal planning and cooking strategies, keeping healthy foods in the home, and planning for success.  Max [name] has agreed to follow-up with our clinic in 5 weeks. He was informed of the importance of frequent follow-up visits to maximize his success with intensive lifestyle modifications for his multiple health  conditions.   Objective:   Blood pressure (!) 162/86, pulse 82, temperature 97.6 F (36.4 C), height '5\' 10"'$  (1.778 m), weight 206 lb (93.4 kg), SpO2 99 %. Body mass index is 29.56 kg/m.  General: Cooperative, alert, well developed, in no acute distress. HEENT: Conjunctivae and lids unremarkable. Cardiovascular: Regular rhythm.  Lungs: Normal work of breathing. Neurologic: No focal deficits.   Lab Results  Component Value Date   CREATININE 0.78 01/03/2020   BUN 11 01/03/2020   NA 143 01/03/2020   K 4.4 01/03/2020   CL 102 01/03/2020   CO2 27 01/03/2020   Lab Results  Component Value Date   ALT 18 01/03/2020   AST 13 01/03/2020   ALKPHOS 99 01/03/2020   BILITOT 0.4 01/03/2020   Lab Results  Component Value Date   HGBA1C 7.9 (H) 01/03/2020   HGBA1C 7.6 (H) 12/11/2018   HGBA1C 7.7 (H) 05/15/2018   HGBA1C 8.3 (H) 09/28/2017   HGBA1C 7.2 (H) 03/09/2016   Lab Results  Component Value Date   INSULIN 8.1 01/03/2020   Lab Results  Component Value Date   TSH 0.471 09/28/2017   Lab Results  Component Value Date   CHOL 171 01/03/2020   HDL 51 01/03/2020   LDLCALC 103 (H) 01/03/2020   TRIG 90 01/03/2020   CHOLHDL 3.8 Ratio 01/09/2008   Lab Results  Component Value Date   VD25OH 60.1 01/03/2020   VD25OH 50.1 09/28/2017   Lab Results  Component Value Date   WBC 9.3 02/17/2022   HGB 15.1 02/17/2022   HCT 45.4 02/17/2022  MCV 79.8 (L) 02/17/2022   PLT 194 02/17/2022   Lab Results  Component Value Date   IRON 65 09/27/2019   TIBC 309 09/27/2019   FERRITIN 123.8 09/27/2019   Attestation Statements:   Reviewed by clinician on day of visit: allergies, medications, problem list, medical history, surgical history, family history, social history, and previous encounter notes.  I, Elnora Morrison, RMA am acting as transcriptionist for [name], MD. I have reviewed the above documentation for accuracy and completeness, and I agree with the above. -  [name], MD

## 2022-04-10 DIAGNOSIS — E119 Type 2 diabetes mellitus without complications: Secondary | ICD-10-CM | POA: Diagnosis not present

## 2022-04-10 DIAGNOSIS — Z794 Long term (current) use of insulin: Secondary | ICD-10-CM | POA: Diagnosis not present

## 2022-04-12 DIAGNOSIS — C642 Malignant neoplasm of left kidney, except renal pelvis: Secondary | ICD-10-CM | POA: Diagnosis not present

## 2022-04-13 DIAGNOSIS — Z961 Presence of intraocular lens: Secondary | ICD-10-CM | POA: Diagnosis not present

## 2022-04-13 DIAGNOSIS — H524 Presbyopia: Secondary | ICD-10-CM | POA: Diagnosis not present

## 2022-04-13 DIAGNOSIS — Z794 Long term (current) use of insulin: Secondary | ICD-10-CM | POA: Diagnosis not present

## 2022-04-13 DIAGNOSIS — H25812 Combined forms of age-related cataract, left eye: Secondary | ICD-10-CM | POA: Diagnosis not present

## 2022-04-13 DIAGNOSIS — H4321 Crystalline deposits in vitreous body, right eye: Secondary | ICD-10-CM | POA: Diagnosis not present

## 2022-04-13 DIAGNOSIS — E119 Type 2 diabetes mellitus without complications: Secondary | ICD-10-CM | POA: Diagnosis not present

## 2022-04-13 DIAGNOSIS — H52203 Unspecified astigmatism, bilateral: Secondary | ICD-10-CM | POA: Diagnosis not present

## 2022-04-13 DIAGNOSIS — H5213 Myopia, bilateral: Secondary | ICD-10-CM | POA: Diagnosis not present

## 2022-04-19 ENCOUNTER — Other Ambulatory Visit: Payer: Self-pay | Admitting: Urology

## 2022-04-29 DIAGNOSIS — M47816 Spondylosis without myelopathy or radiculopathy, lumbar region: Secondary | ICD-10-CM | POA: Diagnosis not present

## 2022-04-29 DIAGNOSIS — G894 Chronic pain syndrome: Secondary | ICD-10-CM | POA: Diagnosis not present

## 2022-04-29 DIAGNOSIS — F32A Depression, unspecified: Secondary | ICD-10-CM | POA: Diagnosis not present

## 2022-05-04 ENCOUNTER — Ambulatory Visit (INDEPENDENT_AMBULATORY_CARE_PROVIDER_SITE_OTHER): Payer: Medicare Other | Admitting: Family Medicine

## 2022-05-04 ENCOUNTER — Encounter (INDEPENDENT_AMBULATORY_CARE_PROVIDER_SITE_OTHER): Payer: Self-pay | Admitting: Family Medicine

## 2022-05-04 VITALS — BP 131/62 | HR 85 | Temp 97.9°F | Ht 70.0 in | Wt 209.0 lb

## 2022-05-04 DIAGNOSIS — C642 Malignant neoplasm of left kidney, except renal pelvis: Secondary | ICD-10-CM | POA: Diagnosis not present

## 2022-05-04 DIAGNOSIS — E669 Obesity, unspecified: Secondary | ICD-10-CM

## 2022-05-04 DIAGNOSIS — Z6830 Body mass index (BMI) 30.0-30.9, adult: Secondary | ICD-10-CM | POA: Diagnosis not present

## 2022-05-04 DIAGNOSIS — E1165 Type 2 diabetes mellitus with hyperglycemia: Secondary | ICD-10-CM

## 2022-05-04 DIAGNOSIS — Z794 Long term (current) use of insulin: Secondary | ICD-10-CM | POA: Diagnosis not present

## 2022-05-04 NOTE — Progress Notes (Deleted)
Patient has been still dealing with back pain and enduring the pain.  Cold weather is difficult and elicits more pain. Patient and wife were unaware that he is scheduled for partial nephrectomy on February 27th.  He is not following meal plan but making his own meals. He is not buying indulgent foods but does still find a way to eat more indulgently. He will eat pot pies occasionally but may also do meat and baked potato.  Tries to balance his meals with protein with a carbohydrate.  Not eating much in terms of portion sizes.  Has been eating more at night particularly snack type foods. Rarely eats bread.  Patient reports he is taking trulicity but there is not trulicity on med list.

## 2022-05-10 DIAGNOSIS — Z794 Long term (current) use of insulin: Secondary | ICD-10-CM | POA: Diagnosis not present

## 2022-05-10 DIAGNOSIS — E1142 Type 2 diabetes mellitus with diabetic polyneuropathy: Secondary | ICD-10-CM | POA: Diagnosis not present

## 2022-05-10 DIAGNOSIS — E1165 Type 2 diabetes mellitus with hyperglycemia: Secondary | ICD-10-CM | POA: Diagnosis not present

## 2022-05-10 DIAGNOSIS — E1169 Type 2 diabetes mellitus with other specified complication: Secondary | ICD-10-CM | POA: Diagnosis not present

## 2022-05-11 NOTE — Progress Notes (Signed)
Patient interviewed over the phone and came in for labs later in day. Did not physically see patient.   COVID Vaccine Completed: yes  Date of COVID positive in last 90 days: no  PCP - Donald Prose, MD Cardiologist - n/a  Chest x-ray - n/a EKG - 05/12/22 Epic/chart Stress Test - n/a ECHO - n/a Cardiac Cath - n/a Pacemaker/ICD device last checked: n/a Spinal Cord Stimulator: n/a  Bowel Prep - Clear liquids day before  Sleep Study - yes CPAP - yes  Fasting Blood Sugar - pt and wife unsure Checks Blood Sugar free style libre  Last dose of GLP1 agonist-  Mounjaro, GLP1 instructions: take 05/18/22. Hold 05/25/22  Jardiance hold 3 day last dose 05/21/22   Last dose of SGLT-2 inhibitors-  N/A SGLT-2 instructions: N/A   Blood Thinner Instructions: n/a Aspirin Instructions: Last Dose:  Activity level: Can go up a flight of stairs and perform activities of daily living without stopping and without symptoms of chest pain or shortness of breath.  Anesthesia review: tachycardia on EKG, A1C 9.3, DM2, DVT, OSA  Patient denies shortness of breath, fever, cough and chest pain at PAT appointment  Patient verbalized understanding of instructions that were given to them at the PAT appointment. Patient was also instructed that they will need to review over the PAT instructions again at home before surgery.

## 2022-05-11 NOTE — Patient Instructions (Signed)
SURGICAL WAITING ROOM VISITATION  Patients having surgery or a procedure may have no more than 2 support people in the waiting area - these visitors may rotate.    Children under the age of 27 must have an adult with them who is not the patient.  Due to an increase in RSV and influenza rates and associated hospitalizations, children ages 60 and under may not visit patients in [place].  If the patient needs to stay at the hospital during part of their recovery, the visitor guidelines for inpatient rooms apply. Pre-op nurse will coordinate an appropriate time for 1 support person to accompany patient in pre-op.  This support person may not rotate.    Please refer to the [facility] website for the visitor guidelines for Inpatients (after your surgery is over and you are in a regular room).    Your procedure is scheduled on: 05/25/22   Report to [facility] Main Entrance    Report to admitting at 5:15 AM   Call this number if you have problems the morning of surgery [phone]   Follow a clear liquid diet the day before surgery.   Nothing to drink after midnight  Water Non-Citrus Juices (without pulp, NO RED-Apple, White grape, White cranberry) Black Coffee (NO MILK/CREAM OR CREAMERS, sugar ok)  Clear Tea (NO MILK/CREAM OR CREAMERS, sugar ok) regular and decaf                             Plain Jell-O (NO RED)                                           Fruit ices (not with fruit pulp, NO RED)                                     Popsicles (NO RED)                                                               Sports drinks like Gatorade (NO RED)                      If you have questions, please contact your surgeon's office.   FOLLOW BOWEL PREP AND ANY ADDITIONAL PRE OP INSTRUCTIONS YOU RECEIVED FROM YOUR SURGEON'S OFFICE!!!     Oral Hygiene is also important to reduce your risk of infection.                                    Remember - BRUSH YOUR TEETH THE  MORNING OF SURGERY WITH YOUR REGULAR TOOTHPASTE  DENTURES WILL BE REMOVED PRIOR TO SURGERY PLEASE DO NOT APPLY "Poly grip" OR ADHESIVES!!!   Take these medicines the morning of surgery with A SIP OF WATER: Amlodipine, Finasteride, Norco, Rosuvastatin   DO NOT TAKE ANY ORAL DIABETIC MEDICATIONS DAY OF YOUR SURGERY  How to Manage Your Diabetes Before and After Surgery  Why is it important to  control my blood sugar before and after surgery? Improving blood sugar levels before and after surgery helps healing and can limit problems. A way of improving blood sugar control is eating a healthy diet by:  Eating less sugar and carbohydrates  Increasing activity/exercise  Talking with your doctor about reaching your blood sugar goals High blood sugars (greater than 180 mg/dL) can raise your risk of infections and slow your recovery, so you will need to focus on controlling your diabetes during the weeks before surgery. Make sure that the doctor who takes care of your diabetes knows about your planned surgery including the date and location.  How do I manage my blood sugar before surgery? Check your blood sugar at least 4 times a day, starting 2 days before surgery, to make sure that the level is not too high or low. Check your blood sugar the morning of your surgery when you wake up and every 2 hours until you get to the Short Stay unit. If your blood sugar is less than 70 mg/dL, you will need to treat for low blood sugar: Do not take insulin. Treat a low blood sugar (less than 70 mg/dL) with  cup of clear juice (cranberry or apple), 4 glucose tablets, OR glucose gel. Recheck blood sugar in 15 minutes after treatment (to make sure it is greater than 70 mg/dL). If your blood sugar is not greater than 70 mg/dL on recheck, call [phone] for further instructions. Report your blood sugar to the short stay nurse when you get to Short Stay.  If you are admitted to the hospital after surgery: Your  blood sugar will be checked by the staff and you will probably be given insulin after surgery (instead of oral diabetes medicines) to make sure you have good blood sugar levels. The goal for blood sugar control after surgery is 80-180 mg/dL.   WHAT DO I DO ABOUT MY DIABETES MEDICATION?  Do not take oral diabetes medicines (pills) the morning of surgery.  Do not take Jardiance 72 hours before your surgery. Last dose 05/21/22. Take Mounjaro 05/18/22 early in the day. DO NOT take [facility] 05/25/22.  DO NOT TAKE THE FOLLOWING 7 DAYS PRIOR TO SURGERY: Ozempic, Wegovy, Rybelsus (Semaglutide), Byetta (exenatide), Bydureon (exenatide ER), Victoza, Saxenda (liraglutide), or [name] (dulaglutide) Mounjaro (Tirzepatide) Adlyxin (Lixisenatide), Polyethylene Glycol Loxenatide.  Reviewed and Endorsed by [facility] Patient Education Committee, August 2015  Bring CPAP mask and tubing day of surgery.                              You may not have any metal on your body including jewelry, and body piercing             Do not wear lotions, powders, cologne, or deodorant  Do not shave  48 hours prior to surgery.               Men may shave face and neck.   Do not bring valuables to the hospital. [place].   Contacts, glasses, dentures or bridgework may not be worn into surgery.   Bring small overnight bag day of surgery.   DO NOT [place]. PHARMACY WILL DISPENSE MEDICATIONS LISTED ON YOUR MEDICATION LIST TO YOU DURING YOUR ADMISSION [place]!   Special Instructions: Bring a copy  of your healthcare power of attorney and living will documents the day of surgery if you haven't scanned them before.              Please read over the following fact sheets you were given: IF [place] [phone] Schneiders   If you received a COVID test during your pre-op visit   it is requested that you wear a mask when out in public, stay away from anyone that may not be feeling well and notify your surgeon if you develop symptoms. If you test positive for Covid or have been in contact with anyone that has tested positive in the last 10 days please notify you surgeon.  [facility] - Preparing for Surgery Before surgery, you can play an important role.  Because skin is not sterile, your skin needs to be as free of germs as possible.  You can reduce the number of germs on your skin by washing with CHG (chlorahexidine gluconate) soap before surgery.  CHG is an antiseptic cleaner which kills germs and bonds with the skin to continue killing germs even after washing. Please DO NOT use if you have an allergy to CHG or antibacterial soaps.  If your skin becomes reddened/irritated stop using the CHG and inform your nurse when you arrive at Short Stay. Do not shave (including legs and underarms) for at least 48 hours prior to the first CHG shower.  You may shave your face/neck.  Please follow these instructions carefully:  1.  Shower with CHG Soap the night before surgery and the  morning of surgery.  2.  If you choose to wash your hair, wash your hair first as usual with your normal  shampoo.  3.  After you shampoo, rinse your hair and body thoroughly to remove the shampoo.                             4.  Use CHG as you would any other liquid soap.  You can apply chg directly to the skin and wash.  Gently with a scrungie or clean washcloth.  5.  Apply the CHG Soap to your body ONLY FROM THE NECK DOWN.   Do   not use on face/ open                           Wound or open sores. Avoid contact with eyes, ears mouth and   genitals (private parts).                       Wash face,  Genitals (private parts) with your normal soap.             6.  Wash thoroughly, paying special attention to the area where your    surgery  will be performed.  7.  Thoroughly rinse your body with warm water  from the neck down.  8.  DO NOT shower/wash with your normal soap after using and rinsing off the CHG Soap.                9.  Pat yourself dry with a clean towel.            10.  Wear clean pajamas.            11.  Place clean sheets on your bed the night of  your first shower and do not  sleep with pets. Day of Surgery : Do not apply any lotions/deodorants the morning of surgery.  Please wear clean clothes to the hospital/surgery center.  FAILURE TO FOLLOW THESE INSTRUCTIONS MAY RESULT IN THE CANCELLATION OF YOUR SURGERY  PATIENT SIGNATURE_________________________________  NURSE SIGNATURE__________________________________  ________________________________________________________________________     WHAT IS A BLOOD TRANSFUSION? Blood Transfusion Information  A transfusion is the replacement of blood or some of its parts. Blood is made up of multiple cells which provide different functions. Red blood cells carry oxygen and are used for blood loss replacement. White blood cells fight against infection. Platelets control bleeding. Plasma helps clot blood. Other blood products are available for specialized needs, such as hemophilia or other clotting disorders. BEFORE THE TRANSFUSION  Who gives blood for transfusions?  Healthy volunteers who are fully evaluated to make sure their blood is safe. This is blood bank blood. Transfusion therapy is the safest it has ever been in the practice of medicine. Before blood is taken from a donor, a complete history is taken to make sure that person has no history of diseases nor engages in risky social behavior (examples are intravenous drug use or sexual activity with multiple partners). The donor's travel history is screened to minimize risk of transmitting infections, such as malaria. The donated blood is tested for signs of infectious diseases, such as HIV and hepatitis. The blood is then tested to be sure it is compatible with you in order to minimize  the chance of a transfusion reaction. If you or a relative donates blood, this is often done in anticipation of surgery and is not appropriate for emergency situations. It takes many days to process the donated blood. RISKS AND COMPLICATIONS Although transfusion therapy is very safe and saves many lives, the main dangers of transfusion include:  Getting an infectious disease. Developing a transfusion reaction. This is an allergic reaction to something in the blood you were given. Every precaution is taken to prevent this. The decision to have a blood transfusion has been considered carefully by your caregiver before blood is given. Blood is not given unless the benefits outweigh the risks. AFTER THE TRANSFUSION Right after receiving a blood transfusion, you will usually feel much better and more energetic. This is especially true if your red blood cells have gotten low (anemic). The transfusion raises the level of the red blood cells which carry oxygen, and this usually causes an energy increase. The nurse administering the transfusion will monitor you carefully for complications. HOME CARE INSTRUCTIONS  No special instructions are needed after a transfusion. You may find your energy is better. Speak with your caregiver about any limitations on activity for underlying diseases you may have. SEEK MEDICAL CARE IF:  Your condition is not improving after your transfusion. You develop redness or irritation at the intravenous (IV) site. SEEK IMMEDIATE MEDICAL CARE IF:  Any of the following symptoms occur over the next 12 hours: Shaking chills. You have a temperature by mouth above 102 F (38.9 C), not controlled by medicine. Chest, back, or muscle pain. People around you feel you are not acting correctly or are confused. Shortness of breath or difficulty breathing. Dizziness and fainting. You get a rash or develop hives. You have a decrease in urine output. Your urine turns a dark color or changes  to pink, red, or brown. Any of the following symptoms occur over the next 10 days: You have a temperature by mouth above 102 F (38.9  C), not controlled by medicine. Shortness of breath. Weakness after normal activity. The white part of the eye turns yellow (jaundice). You have a decrease in the amount of urine or are urinating less often. Your urine turns a dark color or changes to pink, red, or brown. Document Released: 03/12/2000 Document Revised: 06/07/2011 Document Reviewed: 10/30/2007 [facility] Patient Information 2014 [place], [place].  _______________________________________________________________________

## 2022-05-11 NOTE — Progress Notes (Incomplete Revision)
COVID Vaccine Completed: yes  Date of COVID positive in last 90 days: no  PCP - Donald Prose, MD Cardiologist - n/a  Chest x-ray - n/a EKG - 05/12/22 Epic/chart Stress Test - n/a ECHO - n/a Cardiac Cath - n/a Pacemaker/ICD device last checked: n/a Spinal Cord Stimulator: n/a  Bowel Prep - Clear liquids day before  Sleep Study - yes CPAP - yes  Fasting Blood Sugar -  Checks Blood Sugar free style libre  Last dose of GLP1 agonist-  Mounjaro, GLP1 instructions: take 05/18/22. Hold 05/25/22  Jardiance hold 3 day last dose 05/21/22   Last dose of SGLT-2 inhibitors-  N/A SGLT-2 instructions: N/A   Blood Thinner Instructions: n/a Aspirin Instructions: Last Dose:  Activity level: Can go up a flight of stairs and perform activities of daily living without stopping and without symptoms of chest pain or shortness of breath.  Anesthesia review:   Patient denies shortness of breath, fever, cough and chest pain at PAT appointment  Patient verbalized understanding of instructions that were given to them at the PAT appointment. Patient was also instructed that they will need to review over the PAT instructions again at home before surgery.

## 2022-05-12 ENCOUNTER — Inpatient Hospital Stay (HOSPITAL_COMMUNITY): Payer: Medicare Other | Admitting: Physician Assistant

## 2022-05-12 ENCOUNTER — Encounter (HOSPITAL_COMMUNITY): Payer: Self-pay

## 2022-05-12 ENCOUNTER — Encounter (HOSPITAL_COMMUNITY)
Admission: RE | Admit: 2022-05-12 | Discharge: 2022-05-12 | Disposition: A | Discharge status: Home / Self Care | Payer: Medicare Other | Source: Ambulatory Visit | Attending: Urology | Admitting: Urology

## 2022-05-12 ENCOUNTER — Inpatient Hospital Stay (HOSPITAL_COMMUNITY): Payer: Medicare Other | Admitting: Certified Registered Nurse Anesthetist

## 2022-05-12 VITALS — BP 178/87 | HR 100 | Temp 98.1°F | Resp 17 | Ht 70.0 in | Wt 222.0 lb

## 2022-05-12 DIAGNOSIS — E1165 Type 2 diabetes mellitus with hyperglycemia: Secondary | ICD-10-CM | POA: Diagnosis not present

## 2022-05-12 DIAGNOSIS — R000 Tachycardia, unspecified: Secondary | ICD-10-CM | POA: Insufficient documentation

## 2022-05-12 DIAGNOSIS — Z794 Long term (current) use of insulin: Secondary | ICD-10-CM | POA: Diagnosis not present

## 2022-05-12 DIAGNOSIS — Z01818 Encounter for other preprocedural examination: Secondary | ICD-10-CM | POA: Diagnosis not present

## 2022-05-12 DIAGNOSIS — C642 Malignant neoplasm of left kidney, except renal pelvis: Secondary | ICD-10-CM | POA: Diagnosis not present

## 2022-05-12 HISTORY — DX: Personal history of urinary calculi: Z87.442

## 2022-05-12 LAB — HEMOGLOBIN A1C
Hgb A1c MFr Bld: 9.3 % — ABNORMAL HIGH (ref 4.8–5.6)
Mean Plasma Glucose: 220.21 mg/dL

## 2022-05-12 LAB — BASIC METABOLIC PANEL
Anion gap: 10 (ref 5–15)
BUN: 16 mg/dL (ref 8–23)
CO2: 23 mmol/L (ref 22–32)
Calcium: 8.5 mg/dL — ABNORMAL LOW (ref 8.9–10.3)
Chloride: 103 mmol/L (ref 98–111)
Creatinine, Ser: 0.8 mg/dL (ref 0.61–1.24)
GFR, Estimated: >60 mL/min (ref >60)
Glucose, Bld: 408 mg/dL — ABNORMAL HIGH (ref 70–99)
Potassium: 3.9 mmol/L (ref 3.5–5.1)
Sodium: 136 mmol/L (ref 135–145)

## 2022-05-12 LAB — TYPE AND SCREEN
ABO/RH(D): O POS
Antibody Screen: NEGATIVE

## 2022-05-12 LAB — CBC
HCT: 44.5 % (ref 39.0–52.0)
Hemoglobin: 13.9 g/dL (ref 13.0–17.0)
MCH: 26.4 pg (ref 26.0–34.0)
MCHC: 31.2 g/dL (ref 30.0–36.0)
MCV: 84.6 fL (ref 80.0–100.0)
Platelets: 199 10*3/uL (ref 150–400)
RBC: 5.26 MIL/uL (ref 4.22–5.81)
RDW: 13.6 % (ref 11.5–15.5)
WBC: 6.7 10*3/uL (ref 4.0–10.5)
nRBC: 0 % (ref 0.0–0.2)

## 2022-05-12 LAB — GLUCOSE, CAPILLARY: Glucose-Capillary: 432 mg/dL — ABNORMAL HIGH (ref 70–99)

## 2022-05-13 NOTE — Progress Notes (Signed)
Patient's wife stated he had a PSA drawn at [facility] 05/12/22. Called scheduler Coni, RN and left voicemail requesting results.

## 2022-05-18 NOTE — Progress Notes (Signed)
Anesthesia Chart Review   Case: [id number] Date/Time: 05/25/22 0715   Procedure: XI ROBOTIC ASSITED PARTIAL NEPHRECTOMY (Left) - 3 HRS   Anesthesia type: General   Pre-op diagnosis: RENAL CELL CARCINOMA   Location: [facility] ROOM 03 / [facility]   Surgeons: Ceasar Mons, MD       DISCUSSION:76 y.o. never smoker with h/o HTN, DVT, OSA on CPAP, DM II, renal cell carcinoma scheduled for above procedure 05/25/2022 with Dr. Harrell Gave Lovena Neighbours.   Poorly controlled DM II.  CBG 432 at PAT, non fasting.  A1C 9.3.  Was 10.3 05/04/2022. Risk of same day cancellation discussed with pt. Evaluate DOS.  VS: BP (!) 178/87 (BP Location: Left Arm)   Pulse 100   Temp 36.7 C (Oral)   Resp 17   Ht 5' 10"$  (1.778 m)   Wt 100.7 kg   SpO2 97%   BMI 31.85 kg/m   PROVIDERS: Donald Prose, MD is PCP    LABS: Labs reviewed: Acceptable for surgery. (all labs ordered are listed, but only abnormal results are displayed)  Labs Reviewed  BASIC METABOLIC PANEL - Abnormal; Notable for the following components:      Result Value   Glucose, Bld 408 (*)    Calcium 8.5 (*)    All other components within normal limits  HEMOGLOBIN A1C - Abnormal; Notable for the following components:   Hgb A1c MFr Bld 9.3 (*)    All other components within normal limits  GLUCOSE, CAPILLARY - Abnormal; Notable for the following components:   Glucose-Capillary 432 (*)    All other components within normal limits  CBC  TYPE AND SCREEN     IMAGES:   EKG:   CV:  Past Medical History:  Diagnosis Date   ADD (attention deficit disorder)    Allergy    hayfever   Anxiety    Back pain    Bipolar 1 disorder (HCC)    Cataract    right eye-removed   Chronic pain    Constipation    Decreased hearing    Depression    Diabetes mellitus without complication (HCC)    Type II   Dry skin    DVT (deep venous thrombosis) ([place]) 2004   leg (not sure which leg)   Dyslipidemia    Dyspnea    with exertion   Ear drainage     Excessive hunger    Excessive thirst    Eye pain    Fatigue    Frequent urination    GERD (gastroesophageal reflux disease)    Hard of hearing    History of kidney stones    Hyperlipidemia    Hypertension    Itching    Joint pain    Lactose intolerance    Leg cramps    Mouth sores    Nasal and sinus discharge    Nausea    Nausea with vomiting 09/27/2019   Numbness and tingling    bilateral   OSA (obstructive sleep apnea)    wears CPAP   Shortness of breath    Shortness of breath on exertion    Sinus congestion    Sleep apnea    uses cpap   Stiff neck    Stress    Swallowing difficulty    Swelling of extremity    Tinnitus    Trouble in sleeping    Urinary frequency    Vision changes    Weakness    Wears glasses     Past  Surgical History:  Procedure Laterality Date   arm surgery Right    BACK SURGERY  2004   COLONOSCOPY     ESOPHAGOGASTRODUODENOSCOPY     EYE SURGERY Right    cataract   SUBMANDIBULAR GLAND EXCISION Right 03/10/2016   Procedure: INTRAORAL  SUBMANDIBULAR  STONE REMOVAL;  Surgeon: Izora Gala, MD;  Location: [place];  Service: ENT;  Laterality: Right;    MEDICATIONS:  AMBULATORY NON FORMULARY MEDICATION   amLODipine (NORVASC) 10 MG tablet   Blood Glucose Monitoring Suppl (ONETOUCH VERIO) w/Device KIT   Cholecalciferol (VITAMIN D) 125 MCG (5000 UT) CAPS   Coenzyme Q10 (COQ10) 400 MG CAPS   DULoxetine (CYMBALTA) 30 MG capsule   Elastic Bandages & Supports (LUMBAR BACK BRACE/SUPPORT PAD) MISC   empagliflozin (JARDIANCE) 25 MG TABS tablet   finasteride (PROSCAR) 5 MG tablet   glucose blood (ACCU-CHEK AVIVA) test strip   hydrochlorothiazide (HYDRODIURIL) 25 MG tablet   HYDROcodone-acetaminophen (NORCO) 7.5-325 MG tablet   Lancets (ACCU-CHEK SAFE-T PRO) lancets   losartan (COZAAR) 100 MG tablet   magnesium oxide (MAG-OX) 400 (240 Mg) MG tablet   OVER THE COUNTER MEDICATION   OVER THE COUNTER MEDICATION   OVER THE COUNTER MEDICATION   OVER THE  COUNTER MEDICATION   Probiotic Product (PROBIOTIC 10 ULTRA STRENGTH PO)   rosuvastatin (CRESTOR) 10 MG tablet   Selenium 200 MCG CAPS   tirzepatide (MOUNJARO) 2.5 MG/0.5ML Pen   tiZANidine (ZANAFLEX) 2 MG tablet   No current facility-administered medications for this encounter.     Konrad Felix Ward, PA-C [facility] [phone]

## 2022-05-18 NOTE — Progress Notes (Signed)
Chief Complaint:   OBESITY Max [name] is here to discuss his progress with his obesity treatment plan along with follow-up of his obesity related diagnoses. Nadim is on the Category 3 Plan and states he is following his eating plan approximately 0% of the time. [name] states he is exercising 0 minutes 0 times per week.  Today's visit was #: 43 Starting weight: 273 lbs Starting date: 09/28/2017 Today's weight: 209 lbs Today's date: 05/04/2022 Total lbs lost to date: 64 lbs Total lbs lost since last in-office visit: 0  Interim History: [name] has been still dealing with back pain and enduring the pain.  Cold weather is difficult and elicits more pain. Patient and wife were unaware that he is scheduled for partial nephrectomy on February 27th.  He is not following meal plan but making his own meals. He is not buying indulgent foods but does still find a way to eat more indulgently. He will eat pot pies occasionally but may also do meat and baked potato.  Tries to balance his meals with protein with a carbohydrate.  Not eating much in terms of portion sizes.  Has been eating more at night particularly snack type foods. Rarely eats bread.  Patient reports he is taking trulicity but there is not trulicity on med list.  Subjective:   1. Type 2 diabetes mellitus with hyperglycemia, with long-term current use of insulin (HCC) Max [name] is taking insulin.  Per patient he is taking [name] but that's not on medication list.  2. Renal cell carcinoma of left kidney (HCC) Scheduled for partial nephrectomy with Dr. Lovena Neighbours.  Patient's wife is unaware of this.  Assessment/Plan:   1. Type 2 diabetes mellitus with hyperglycemia, with long-term current use of insulin [facility]) Patient is to ask Dr. Buddy Duty about switching to [facility].  2. Renal cell carcinoma of left kidney ([place]) Follow up with patient after procedure.  3. BMI 30.0-30.9,adult  4. Obesity with current BMI of 30.1 Max [name] is currently in  the action stage of change. As such, his goal is to continue with weight loss efforts. He has agreed to the Category 3 Plan.   Exercise goals: Older adults should follow the adult guidelines. When older adults cannot meet the adult guidelines, they should be as physically active as their abilities and conditions will allow.   Behavioral modification strategies: increasing lean protein intake, meal planning and cooking strategies, keeping healthy foods in the home, and planning for success.  [name] has agreed to follow-up with our clinic in 8 weeks. He was informed of the importance of frequent follow-up visits to maximize his success with intensive lifestyle modifications for his multiple health conditions.   Objective:   Blood pressure 131/62, pulse 85, temperature 97.9 F (36.6 C), height '5\' 10"'$  (1.778 m), weight 209 lb (94.8 kg), SpO2 98 %. Body mass index is 29.99 kg/m.  General: Cooperative, alert, well developed, in no acute distress. HEENT: Conjunctivae and lids unremarkable. Cardiovascular: Regular rhythm.  Lungs: Normal work of breathing. Neurologic: No focal deficits.   Lab Results  Component Value Date   CREATININE 0.80 05/12/2022   BUN 16 05/12/2022   NA 136 05/12/2022   K 3.9 05/12/2022   CL 103 05/12/2022   CO2 23 05/12/2022   Lab Results  Component Value Date   ALT 18 01/03/2020   AST 13 01/03/2020   ALKPHOS 99 01/03/2020   BILITOT 0.4 01/03/2020   Lab Results  Component Value Date   HGBA1C 9.3 (H) 05/12/2022  HGBA1C 7.9 (H) 01/03/2020   HGBA1C 7.6 (H) 12/11/2018   HGBA1C 7.7 (H) 05/15/2018   HGBA1C 8.3 (H) 09/28/2017   Lab Results  Component Value Date   INSULIN 8.1 01/03/2020   Lab Results  Component Value Date   TSH 0.471 09/28/2017   Lab Results  Component Value Date   CHOL 171 01/03/2020   HDL 51 01/03/2020   LDLCALC 103 (H) 01/03/2020   TRIG 90 01/03/2020   CHOLHDL 3.8 Ratio 01/09/2008   Lab Results  Component Value Date   VD25OH  60.1 01/03/2020   VD25OH 50.1 09/28/2017   Lab Results  Component Value Date   WBC 6.7 05/12/2022   HGB 13.9 05/12/2022   HCT 44.5 05/12/2022   MCV 84.6 05/12/2022   PLT 199 05/12/2022   Lab Results  Component Value Date   IRON 65 09/27/2019   TIBC 309 09/27/2019   FERRITIN 123.8 09/27/2019   Attestation Statements:   Reviewed by clinician on day of visit: allergies, medications, problem list, medical history, surgical history, family history, social history, and previous encounter notes.  I, Elnora Morrison, RMA am acting as transcriptionist for [name], MD.  I have reviewed the above documentation for accuracy and completeness, and I agree with the above. - [name], MD

## 2022-05-24 NOTE — H&P (Incomplete)
Office Visit Report     05/12/2022   --------------------------------------------------------------------------------   [name]  [id number]  DOB: 1946-01-06, 77 year old Male  SSN: **[id number]   PRIMARY CARE:  Antony Blackbird-, MD  PRIMARY CARE FAX:  [phone]  REFERRING:  Elianis Fischbach A. Lovena Neighbours, MD  PROVIDER:  Irine Seal, M.D.  TREATING:  Jiles Crocker, NP  LOCATION:  [facility] [phone]     --------------------------------------------------------------------------------   CC/HPI: Renal mass   Mr. Dirienzo is a 61 male with a solid and slightly enhancing renal mass measuring 3.4 cm involving the LEFT kidney. He has a history of grade 1 prostate cancer and is currently on active surveillance with Dr. Jeffie Pollock. The left renal lesion was initially discovered on CT stone study from 08/05/2021 during evaluation for left-sided flank pain and further characterized by MRI on 08/14/2021. He was noted to have abdominal, retroperitoneal and mesenteric lymphadenopathy on his recent CT and MRI   -Anatomy: Exophytic 3.4 cm left midpole mass. Single artery and vein  -Personal/family history of GU malignancies: History of grade 1 prostate cancer that is currently under active surveillance  -Smoking history: non-smoker  -Prior abdominal surgeries: denies  -Renal function: Last serum creatinine was 0.8 with an eGFR greater than 60  -History of kidney stones: denies   01/25/2022: The patient is here today for routine follow-up. I had ordered a left renal mass biopsy and CT scan at his last visit, but neither study has been completed. He continues to experience left-sided flank discomfort, but does denies dysuria or hematuria..   04/12/2022: The patient is here today for routine follow-up. 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 He is here today to discuss surgery.   05/12/2022: Patient here today for  preoperative appointment prior to undergoing left robotic partial nephrectomy with Dr. Lovena Neighbours on 2/27. Denies changes in past medical history, prescription medications taken on daily basis. No interval surgical or procedural intervention. He is voiding at his stable baseline without any dysuria or gross hematuria. Denies any other recent infection treatment. Patient denies any recent chest pain, shortness of breath, fever/chills, nausea/vomiting.     ALLERGIES: [place] PTWK Cyclobenzaprine HCl TABS fentanyl Latex Lisinopril TABS Morphine Sulfate SOLN Pravastatin Sodium TABS Robaxin TABS Sprix SOLN    MEDICATIONS: Aspirin 81 mg tablet,chewable  AmLODIPine Besylate 10 MG Oral Tablet Oral  Co Q-10 400 mg capsule Oral  CPAP  Cymbalta 60 MG Oral Capsule Delayed Release Particles Oral  Hydralazine Hcl  Ibuprofen  Iron  Jardiance 1 tablet PO Daily  Losartan Potassium 100 mg tablet Oral  Naltrexone Base Monohydrate '2mg'$  po daily  Tizanidine Hcl  [name] 0.5 ml SQ [id number]  Vitamin B-12 1000 MCG Oral Tablet Oral     GU PSH: Complex cystometrogram, w/ void pressure and urethral pressure profile studies, any technique - 2020 Complex Uroflow - 2020, 2019 Cystoscopy - 2019 Emg surf Electrd - 2020 Inject For cystogram - 2020 Intrabd voidng Press - 2020 Locm 300-'399Mg'$ /Ml Iodine,1Ml - 02/02/2022 Prostate Needle Biopsy - 05/22/2021, 03/05/2020       PSH Notes: Laminectomy Lumbar, Cataract Surgery  spinal surgery     NON-GU PSH: Remove Spinal Lamina - 2012 Surgical Pathology, Gross And Microscopic Examination For Prostate Needle - 05/22/2021, 03/05/2020     GU PMH: Prostate Cancer - 04/12/2022, His PSA is down on finasteride and he will return in 3 months with a PSA. , - 08/31/2021, -  05/22/2021, His PSA is rising on finasteride and he needs an MR fusion biopsy. I have reviewed the risks and sent Levaquin. , - 05/06/2021, He is doing well but his PSA is up slightly despite the finasteride. I will  have him return in 3 months with another PSA and an MRIP. If the MRIP is positive or the PSA continues to rise on finasteride, he will need a repeat biopsy. , - 01/05/2021, He is doing well without worrisome symptoms and his PSA has declined on finasteride. He will return in 3 months for a PSA and exam and will need a MRIP in about 6 months. , - 10/06/2020, His PSA is falling appropriately on finasteride. He will return in 3 months with labs. , - 07/07/2020, T2a Nx Mx Gleason 6(3+3) Very low risk prostate cancer with a 47.71m prostate. I discussed Active surveillance, Cryotherapy, RALP, EXRT and seeds and have recommmended he go with surveillance. He is agreeable to that approach. I will add finasteride as noted below. he had a questionable intolerance to finasteride in the past with nosebleeds and lowered glucose reported but isn't sure that was the cause and those would be unlikely side effects of the finasteride so we will try it again. He will return in 3 months with labs. , - 2022 Renal cell carcinoma, left - 04/12/2022 Left renal neoplasm - 02/02/2022, - 01/25/2022, - 12/03/2021 Benign Neo Lft adrenal gland - 08/31/2021 Benign Neo Rt adrenal gland - [id number] uncertain neoplasm of kidney, He has a 3.2cm exophytic left renal mass that is probably a papillary RCCa but a complex cyst is a possibility. I will have him see one of our renal surgeons for further evaluation and manage of the left renal mass and if necessary the bilateral adrenal adenomas. - 08/31/2021 Prostate nodule w/ LUTS, He has moderate LUTS with urgency and frequency. He has not seen a decline with the med but he will continue the finasteride for now. - 08/31/2021, finasteride refilled. He is voiding better on the med and the nodule is not readily palpable any more. , - 05/06/2021, He has stable LUTS with some nocturia x urgency. continue finasteride and doxazosin., - 01/05/2021, His LUTS have improved on therapy. He will continue both meds. , -  10/06/2020, His LUTS have improved with the finasteride and doxazosin but he still has marked nocturia but has 2+ glucosuria and a high fluid intake. , - 07/07/2020, I have refilled the doxazosin and will add finasteride. I have reviewed the risks and side effects of the finasteride along with the impact on the PSA and how it might impact the prostate cancer. , - 2022, - 03/05/2020, Small palpable nodule noted on the left apex. This is a new exam finding today. He will have repeat PSA today. I will discuss with his primary urologist regarding laboratory value and physical exam findings. Discussed likelihood of proceeding with prostate biopsy with the patient today., - 01/18/2020 Urinary Urgency - 08/31/2021, - 01/05/2021 Flank Pain - 08/04/2021, He has chronic flank pain but was told he had a stone. [facility] and treatment. He may have had a KUB but he is not sure. He has no hematuria and the pain seems more musculoskeletal but if he just had a KUB, I will get a CT stone study for further evaluation. Otherwise he will return for his routine prostate cancer f/u. , - 07/29/2021 Elevated PSA - 05/22/2021, - 05/06/2021, - 01/05/2021, -  10/06/2020, - 07/07/2020, - 03/05/2020, - 01/18/2020, His PSA is up from prior levels and I will get a repeat prior to his f/u with Dr. Gloriann Loan. If it continues to rise, he may need a biopsy. , 17-Jun-2018 Nocturia - 05/06/2021, - 01/05/2021, - 10/06/2020, - 07/07/2020, 06/16/2020 (Stable), - 06/16/16 (Stable), - 06/17/2015, Nocturia, - 06-17-2014 Urinary Frequency, Continuing to have bothersome lower urinary tract symptoms which was thought to be best alleviated by a definitive outlet reduction procedure. His elevated PSA and new physical exam findings on DRE today will likely need to be further evaluated in the form of a prostate biopsy before proceeding with definitive outlet reduction procedure. I discussed this in detail with the patient and his wife  today. - 01/18/2020, June 17, 2019 Weak Urinary Stream - 06-16-17 BPH w/LUTS - 06-16-2016, 06-17-15 (Worsening), He has some increased nocturia., 17-Jun-2015, BPH with obstruction/lower urinary tract symptoms, - June 17, 2015 Encounter for Prostate Cancer screening (Stable) - 06/17/15, Prostate cancer screening, - 2015/06/17 Low back pain, Lower back pain - 06-16-2012 Obstructive and reflux uropathy, Unspec, Obstructive uropathy - 2012-06-16 Urinary Retention, Unspec, Incomplete bladder emptying - 06/16/12      PMH Notes:  2010-08-12 11:04:38 - Note: Venous Thrombosis Of The Deep Vessels Of The Lower Extremity  2010-08-12 11:01:44 - Note: Arthritis   NON-GU PMH: Enlarged lymph nodes, unspecified - 12/03/2021 Localized enlarged lymph nodes, I will get a PET scan as recommended and reassess accordingly. - 08/31/2021 Encounter for general adult medical examination without abnormal findings, Encounter for preventive health examination - 06-17-2015 Personal history of colonic polyps, History of adenomatous polyp of colon - 06-17-14 Unspecified thoracic, thoracolumbar and lumbosacral intervertebral disc disorder, Lumbar disc disease - 2014/06/17 Anxiety, Anxiety (Symptom) - June 16, 2012 Gastric ulcer, unspecified as acute or chronic, without hemorrhage or perforation, Gastric Ulcer - 16-Jun-2012 Personal history of other diseases of the circulatory system, History of hypertension - 2012-06-16 Personal history of other diseases of the nervous system and sense organs, History of sleep apnea - 06/16/2012 Personal history of other endocrine, nutritional and metabolic disease, History of hyperlipidemia - 06-16-2012, History of type 2 diabetes mellitus, - 06-16-12 Personal history of other mental and behavioral disorders, History of depression - 2012/06/16 Personal history of other specified conditions, History of heartburn - 16-Jun-2012    FAMILY HISTORY: Death In The Family Father - Runs In Family Diabetes - Sister, Brother, Brother, Brother Family Health Status Number - Runs In Family Hypertension - Brother, Brother,  Brother Kidney Cancer - Mother Prostate Cancer - Brother   SOCIAL HISTORY: Marital Status: Married Preferred Language: English; Ethnicity: Not Hispanic Or Latino; Race: Black or African American Current Smoking Status: Patient has never smoked.  Has never drank.  Drinks 1 caffeinated drink per day.     Notes: Father deceased, Mother deceased, Never a smoker, Caffeine Use, Tobacco Non-user, Marital History - Currently Married, Not Currently Employed, Alcohol Use   REVIEW OF SYSTEMS:    GU Review Male:   Patient reports get up at night to urinate. Patient denies frequent urination, hard to postpone urination, burning/ pain with urination, leakage of urine, stream starts and stops, trouble starting your stream, have to strain to urinate , erection problems, and penile pain.  Gastrointestinal (Upper):   Patient denies nausea, vomiting, and indigestion/ heartburn.  Gastrointestinal (Lower):   Patient denies diarrhea and constipation.  Constitutional:   Patient denies fever, weight loss, night sweats, and fatigue.  Skin:   Patient denies skin rash/ lesion and itching.  Eyes:   Patient denies blurred vision and double vision.  Ears/ Nose/ Throat:   Patient denies sore throat and sinus problems.  Hematologic/Lymphatic:   Patient denies swollen glands and easy bruising.  Cardiovascular:   Patient denies leg swelling and chest pains.  Respiratory:   Patient denies cough and shortness of breath.  Endocrine:   Patient denies excessive thirst.  Musculoskeletal:   Patient denies back pain and joint pain.  Neurological:   Patient denies headaches and dizziness.  Psychologic:   Patient denies depression and anxiety.   Notes: Nocturia 5-7 times Reviewed previous review of systems 01/25/2022. No changes.   VITAL SIGNS:      05/12/2022 10:42 AM  BP 162/94 mmHg  Pulse 106 /min   MULTI-SYSTEM PHYSICAL EXAMINATION:    Constitutional: Well-nourished. No physical deformities. Normally developed. Good  grooming.  Neck: Neck symmetrical, not swollen. Normal tracheal position.  Respiratory: No labored breathing, no use of accessory muscles.   Cardiovascular: Normal temperature, normal extremity pulses, no swelling, no varicosities.  Skin: No paleness, no jaundice, no cyanosis. No lesion, no ulcer, no rash.  Neurologic / Psychiatric: Oriented to time, oriented to place, oriented to person. No depression, no anxiety, no agitation.  Gastrointestinal: Obese abdomen. No mass, no tenderness, no rigidity.   Musculoskeletal: Normal gait and station of head and neck.     Complexity of Data:  Source Of History:  Patient, Family/Caregiver, Medical Record Summary  Lab Test Review:   PSA  Records Review:   Pathology Reports, Previous Doctor Records, Previous Hospital Records, Previous Patient Records  Urine Test Review:   Urinalysis, Urine Culture  X-Ray Review: C.T. Abdomen/Pelvis: Reviewed Films. Reviewed Report.  MRI Abdomen: Reviewed Report.     11/26/21 08/25/21 04/06/21 12/29/20 10/01/20 06/30/20 01/18/20 03/16/19  PSA  Total PSA 1.96 ng/mL 2.75 ng/mL 3.86 ng/mL 3.12 ng/mL 2.44 ng/mL 2.88 ng/mL 3.84 ng/mL 5.75 ng/mL  Free PSA       8.18 ng/mL 0.29 ng/mL  % Free PSA       213 % PSA 5 % PSA    05/12/22  Urinalysis  Urine Appearance Clear   Urine Color Yellow   Urine Glucose 2+ mg/dL  Urine Bilirubin Neg mg/dL  Urine Ketones Neg mg/dL  Urine Specific Gravity 1.015   Urine Blood Neg ery/uL  Urine pH 5.5   Urine Protein Neg mg/dL  Urine Urobilinogen 0.2 mg/dL  Urine Nitrites Neg   Urine Leukocyte Esterase Neg leu/uL   PROCEDURES:          Urinalysis - 81003 Dipstick Dipstick Cont'd  Color: Yellow Bilirubin: Neg mg/dL  Appearance: Clear Ketones: Neg mg/dL  Specific Gravity: 1.015 Blood: Neg ery/uL  pH: 5.5 Protein: Neg mg/dL  Glucose: 2+ mg/dL Urobilinogen: 0.2 mg/dL    Nitrites: Neg    Leukocyte Esterase: Neg leu/uL    ASSESSMENT:      ICD-10 Details  1 GU:   Renal cell  carcinoma, left - C64.2 Left, Chronic, Threat to Bodily Function  2   Prostate Cancer - C61 Chronic, Threat to Bodily Function  3 NON-GU:   Encounter for other preprocedural examination - Z01.818 Undiagnosed New Problem   PLAN:           Orders Labs PSA, Urine Culture          Schedule Return Visit/Planned Activity: Keep Scheduled Appointment - Follow up MD, Schedule Surgery          Document Letter(s):  Created for Patient: Clinical Summary  Notes:   All questions answered to the best of my ability regarding the upcoming procedure and expected postoperative course with understanding expressed by the patient and his wife. He is aware regarding instructions to hold dose of previously prescribed GLP-1 agonist/SGLT-2 inhibitor 1 week prior to the procedure. Urine culture sent today to serve as a baseline. He is also due for PSA screening so we will have that lab drawn prior to leaving clinic given his past history of prostate cancer. Moving forward he will proceed with previously scheduled robotic left partial nephrectomy on 2/27 with Dr. Lovena Neighbours.   I reviewed the AUA guidelines for evaluation and treatment of the small renal mass. The options of active surveillance, in situ tumor ablation, partial and radical nephrectomy was discussed. The risks of robot-assisted LEFT partial nephrectomy were discussed in detail including but not limited to: negative pathology, open conversion, completion nephrectomy, infection of the urinary tract/skin/abdominal cavity, VTE, MI/CVA, lymphatic leak, injury to adjacent solid/hollow viscus organs, bleeding requiring a blood transfusion, catastrophic bleeding, hernia formation, need for postoperative angioembolization, urinary leak requiring stent/drain, and other imponderables. He voices understanding and wishes to proceed.

## 2022-05-24 NOTE — Anesthesia Preprocedure Evaluation (Signed)
Anesthesia Evaluation    Reviewed: Allergy & Precautions, Patient's Chart, lab work & pertinent test results  Airway        Dental   Pulmonary sleep apnea           Cardiovascular hypertension,      Neuro/Psych  PSYCHIATRIC DISORDERS   Bipolar Disorder      GI/Hepatic ,GERD  ,,  Endo/Other  diabetes, Type 2    Renal/GU Renal diseaseL Renal mass Lab Results      Component                Value               Date                      CREATININE               0.80                05/12/2022                BUN                      16                  05/12/2022                NA                       136                 05/12/2022                K                        3.9                 05/12/2022                    Musculoskeletal Hx of chronic pain   Abdominal   Peds  Hematology Lab Results      Component                Value               Date                      WBC                      6.7                 05/12/2022                HGB                      13.9                05/12/2022                HCT                      44.5                05/12/2022  PLT                      199                 05/12/2022              Anesthesia Other Findings ALL: See List  Reproductive/Obstetrics                             Anesthesia Physical Anesthesia Plan  ASA: 3  Anesthesia Plan: General   Post-op Pain Management: Lidocaine infusion*, Ketamine IV* and Ofirmev IV (intra-op)*   Induction: Intravenous  PONV Risk Score and Plan: 3 and Treatment may vary due to age or medical condition, Ondansetron and Midazolam  Airway Management Planned: Oral ETT  Additional Equipment: None  Intra-op Plan:   Post-operative Plan: Extubation in OR  Informed Consent:      Dental advisory given  Plan Discussed with:   Anesthesia Plan Comments: (PT on Monjaoro  2x 18g)        Anesthesia Quick Evaluation

## 2022-05-25 ENCOUNTER — Ambulatory Visit (HOSPITAL_COMMUNITY)
Admission: RE | Admit: 2022-05-25 | Discharge: 2022-05-25 | Disposition: A | Discharge status: Home / Self Care | Payer: Medicare Other | Source: Ambulatory Visit | Attending: Urology | Admitting: Urology

## 2022-05-25 ENCOUNTER — Encounter (HOSPITAL_COMMUNITY)
Admission: RE | Disposition: A | Discharge status: Home / Self Care | Payer: Self-pay | Source: Ambulatory Visit | Attending: Urology

## 2022-05-25 DIAGNOSIS — Z538 Procedure and treatment not carried out for other reasons: Secondary | ICD-10-CM | POA: Insufficient documentation

## 2022-05-25 DIAGNOSIS — E1165 Type 2 diabetes mellitus with hyperglycemia: Principal | ICD-10-CM

## 2022-05-25 DIAGNOSIS — Z794 Long term (current) use of insulin: Secondary | ICD-10-CM

## 2022-05-25 DIAGNOSIS — Z91198 Patient's noncompliance with other medical treatment and regimen for other reason: Secondary | ICD-10-CM | POA: Diagnosis not present

## 2022-05-25 LAB — GLUCOSE, CAPILLARY: Glucose-Capillary: 125 mg/dL — ABNORMAL HIGH (ref 70–99)

## 2022-05-25 SURGERY — NEPHRECTOMY, PARTIAL, ROBOT-ASSISTED
Anesthesia: General

## 2022-05-25 MED ORDER — LIDOCAINE HCL 2 % IJ SOLN
INTRAMUSCULAR | Status: AC
  Filled 2022-05-25: qty 20

## 2022-05-25 MED ORDER — ONDANSETRON HCL 4 MG/2ML IJ SOLN
INTRAMUSCULAR | Status: AC
  Filled 2022-05-25: qty 2

## 2022-05-25 MED ORDER — PROPOFOL 10 MG/ML IV BOLUS
INTRAVENOUS | Status: AC
  Filled 2022-05-25: qty 20

## 2022-05-25 MED ORDER — FENTANYL CITRATE (PF) 100 MCG/2ML IJ SOLN
INTRAMUSCULAR | Status: AC
  Filled 2022-05-25: qty 2

## 2022-05-25 MED ORDER — ROCURONIUM BROMIDE 10 MG/ML (PF) SYRINGE
PREFILLED_SYRINGE | INTRAVENOUS | Status: AC
  Filled 2022-05-25: qty 10

## 2022-05-25 MED ORDER — DEXAMETHASONE SODIUM PHOSPHATE 10 MG/ML IJ SOLN
INTRAMUSCULAR | Status: AC
  Filled 2022-05-25: qty 1

## 2022-05-25 MED ORDER — LACTATED RINGERS IV SOLN: INTRAVENOUS | Status: DC

## 2022-05-25 MED ORDER — MIDAZOLAM HCL 2 MG/2ML IJ SOLN
INTRAMUSCULAR | Status: AC
  Filled 2022-05-25: qty 2

## 2022-05-25 SURGICAL SUPPLY — 75 items
ADH SKN CLS APL DERMABOND .7 (GAUZE/BANDAGES/DRESSINGS) ×1
AGENT HMST KT MTR STRL THRMB (HEMOSTASIS)
APL ESCP 34 STRL LF DISP (HEMOSTASIS)
APL LAPSCP 35 DL APL RGD (MISCELLANEOUS) ×1
APL PRP STRL LF DISP 70% ISPRP (MISCELLANEOUS) ×1
APPLICATOR SURGIFLO ENDO (HEMOSTASIS) IMPLANT
APPLICATOR VISTASEAL 35 (MISCELLANEOUS) ×2 IMPLANT
BAG COUNTER SPONGE SURGICOUNT (BAG) IMPLANT
BAG SPNG CNTER NS LX DISP (BAG)
CHLORAPREP W/TINT 26 (MISCELLANEOUS) ×2 IMPLANT
CLIP LIGATING HEM O LOK PURPLE (MISCELLANEOUS) ×4 IMPLANT
CLIP LIGATING HEMO LOK XL GOLD (MISCELLANEOUS) IMPLANT
CLIP LIGATING HEMO O LOK GREEN (MISCELLANEOUS) ×1 IMPLANT
CLIP SUT LAPRA TY ABSORB (SUTURE) ×4 IMPLANT
COVER SURGICAL LIGHT HANDLE (MISCELLANEOUS) ×1 IMPLANT
COVER TIP SHEARS 8 DVNC (MISCELLANEOUS) ×2 IMPLANT
COVER TIP SHEARS 8MM DA VINCI (MISCELLANEOUS) ×1
CUTTER ECHEON FLEX ENDO 45 340 (ENDOMECHANICALS) IMPLANT
DERMABOND ADVANCED .7 DNX12 (GAUZE/BANDAGES/DRESSINGS) ×1 IMPLANT
DRAIN CHANNEL 15F RND FF 3/16 (WOUND CARE) IMPLANT
DRAPE ARM DVNC X/XI (DISPOSABLE) ×8 IMPLANT
DRAPE COLUMN DVNC XI (DISPOSABLE) ×2 IMPLANT
DRAPE DA VINCI XI ARM (DISPOSABLE) ×4
DRAPE DA VINCI XI COLUMN (DISPOSABLE) ×1
DRAPE INCISE IOBAN 66X45 STRL (DRAPES) ×2 IMPLANT
DRAPE SHEET LG 3/4 BI-LAMINATE (DRAPES) ×1 IMPLANT
ELECT PENCIL ROCKER SW 15FT (MISCELLANEOUS) ×2 IMPLANT
ELECT REM PT RETURN 15FT ADLT (MISCELLANEOUS) ×2 IMPLANT
EVACUATOR SILICONE 100CC (DRAIN) IMPLANT
GAUZE 4X4 16PLY ~~LOC~~+RFID DBL (SPONGE) IMPLANT
GLOVE BIO SURGEON STRL SZ 6.5 (GLOVE) ×2 IMPLANT
GLOVE BIOGEL PI IND STRL 8 (GLOVE) ×2 IMPLANT
GLOVE SURG LX STRL 7.5 STRW (GLOVE) ×4 IMPLANT
GOWN SRG XL LVL 4 BRTHBL STRL (GOWNS) ×2 IMPLANT
GOWN STRL NON-REIN XL LVL4 (GOWNS) ×1
GOWN STRL REUS W/ TWL XL LVL3 (GOWN DISPOSABLE) ×4 IMPLANT
GOWN STRL REUS W/TWL XL LVL3 (GOWN DISPOSABLE) ×2
HEMOSTAT SURGICEL 4X8 (HEMOSTASIS) ×2 IMPLANT
HOLDER FOLEY CATH W/STRAP (MISCELLANEOUS) ×2 IMPLANT
IRRIG SUCT STRYKERFLOW 2 WTIP (MISCELLANEOUS) ×1
IRRIGATION SUCT STRKRFLW 2 WTP (MISCELLANEOUS) ×2 IMPLANT
KIT BASIN OR (CUSTOM PROCEDURE TRAY) ×2 IMPLANT
KIT TURNOVER KIT A (KITS) IMPLANT
LOOP VESSEL MAXI BLUE (MISCELLANEOUS) IMPLANT
MARKER SKIN DUAL TIP RULER LAB (MISCELLANEOUS) ×1 IMPLANT
NDL INSUFFLATION 14GA 120MM (NEEDLE) ×1 IMPLANT
NEEDLE INSUFFLATION 14GA 120MM (NEEDLE) ×1 IMPLANT
PROTECTOR NERVE ULNAR (MISCELLANEOUS) ×4 IMPLANT
SCISSORS LAP 5X45 EPIX DISP (ENDOMECHANICALS) ×2 IMPLANT
SEAL CANN UNIV 5-8 DVNC XI (MISCELLANEOUS) ×8 IMPLANT
SEAL XI 5MM-8MM UNIVERSAL (MISCELLANEOUS) ×4
SET TUBE SMOKE EVAC HIGH FLOW (TUBING) ×1 IMPLANT
SOL ELECTROSURG ANTI STICK (MISCELLANEOUS) ×1
SOLUTION ELECTROSURG ANTI STCK (MISCELLANEOUS) ×2 IMPLANT
SPIKE FLUID TRANSFER (MISCELLANEOUS) ×1 IMPLANT
STAPLE RELOAD 45 WHT (STAPLE) IMPLANT
STAPLE RELOAD 45MM WHITE (STAPLE)
SURGIFLO W/THROMBIN 8M KIT (HEMOSTASIS) IMPLANT
SUT ETHILON 2 0 PSLX (SUTURE) IMPLANT
SUT MNCRL AB 4-0 PS2 18 (SUTURE) ×4 IMPLANT
SUT PDS AB 0 CT1 36 (SUTURE) IMPLANT
SUT V-LOC BARB 180 2/0GR9 GS23 (SUTURE) ×1
SUT VIC AB 1 CT1 36 (SUTURE) ×8 IMPLANT
SUT VICRYL 0 UR6 27IN ABS (SUTURE) IMPLANT
SUT VLOC BARB 180 ABS3/0GR12 (SUTURE) ×2
SUTURE V-LC BRB 180 2/0GR9GS23 (SUTURE) ×1 IMPLANT
SUTURE VLOC BRB 180 ABS3/0GR12 (SUTURE) ×2 IMPLANT
SYS BAG RETRIEVAL 10MM (BASKET) ×1
SYSTEM BAG RETRIEVAL 10MM (BASKET) ×2 IMPLANT
TOWEL OR 17X26 10 PK STRL BLUE (TOWEL DISPOSABLE) ×2 IMPLANT
TRAY FOLEY MTR SLVR 16FR STAT (SET/KITS/TRAYS/PACK) ×1 IMPLANT
TRAY LAPAROSCOPIC (CUSTOM PROCEDURE TRAY) ×2 IMPLANT
TROCAR Z THREAD OPTICAL 12X100 (TROCAR) ×2 IMPLANT
TROCAR Z-THREAD OPTICAL 5X100M (TROCAR) IMPLANT
WATER STERILE IRR 1000ML POUR (IV SOLUTION) ×1 IMPLANT

## 2022-05-25 NOTE — Progress Notes (Signed)
Surgery cancelled due to the patient and his wife being completely confused about the nature of his surgery despite office visits discussing such on 12/03/2021, 01/25/2022, 04/12/2022 and 05/12/2022.  Furthermore, the patient has been noncompliant with his type 2 diabetes regimen with a recent blood sugar of 408 mg/dL and a hemoglobin A1c of 9.3.  I strongly encouraged the patient to follow-up with his primary care physician to do an inventory on his diabetes regimen and work on better glycemic control before any surgical/procedural intervention will be undertaken to address the renal mass involving his left kidney.

## 2022-05-25 NOTE — Progress Notes (Signed)
Entered pts room to begin preop assessment 0545. Pt identified self but when asked procedure to be done he was unaware of procedure identified. Pt also denied allergy list and several medications listed. Nurse contacted pts wife whom came and seemed unaware of pts surgery listed, allergy list and medications discussed. Nurse contacted Dr Lovena Neighbours to update on situation. Stated he would be arriving at hospital soon. Nurse contacted pts son Cheral [place] whom came back and was concerned on pts cognition level. Had son stay in room with pt and pts wife till MD arrived in which by this time pts daughter was also in room. Dr Lovena Neighbours discussed situation and decided to cancel procedure/surgery for today. All in agreement. Pt dressed and nurse escorted he and his wife to front of hospital to his family.

## 2022-06-10 DIAGNOSIS — G894 Chronic pain syndrome: Secondary | ICD-10-CM | POA: Diagnosis not present

## 2022-06-10 DIAGNOSIS — M47816 Spondylosis without myelopathy or radiculopathy, lumbar region: Secondary | ICD-10-CM | POA: Diagnosis not present

## 2022-06-10 DIAGNOSIS — F32A Depression, unspecified: Secondary | ICD-10-CM | POA: Diagnosis not present

## 2022-06-10 DIAGNOSIS — K5903 Drug induced constipation: Secondary | ICD-10-CM | POA: Diagnosis not present

## 2022-06-12 NOTE — Progress Notes (Signed)
HPI  male never smoker followed for OSA, complicated by DM, hx DVT, HBP, morbid obesity -------------------   06/11/21- 77 year old male never smoker followed for OSA, Asthma, complicated by DM2, hx DVT, HTN,  morbid obesity, Hyperlipidemia,  CPAP auto 8-20/ [employer] Download-compliance 97%, AHI 4.8/ hr Body weight today- Covid vax-4 [employer] Flu vax-had His machine is due to be replaced and needs some maintenance but otherwise he has done well with good compliance and control on review of download.  Breathing is comfortable with no acute events.  He had had Adderall at one point but did not find it useful.  06/14/22- 77 year old male never smoker followed for OSA, Asthma, complicated by DM2, hx DVT, HTN,  morbid obesity, Hyperlipidemia, LRenal Cell Cancer,  CPAP auto 8-20/ [employer] Download-compliance  Body weight today- Covid vax-4 Phizer Flu vax Urology had cancelled kidney surgery for Renal Cell Ca due to his poor DM control.   ROS-see HPI   + = positive Constitutional:   + weight loss, night sweats, fevers, chills, +fatigue, lassitude. HEENT:   No-  headaches, difficulty swallowing, tooth/dental problems, sore throat,       No-  sneezing, itching, ear ache, +nasal congestion, post nasal drip,  CV:  No-   chest pain, orthopnea, PND, swelling in lower extremities, anasarca, dizziness, palpitations Resp: No-   shortness of breath with exertion or at rest.              No-   productive cough,  No non-productive cough,  No- coughing up of blood.              No-   change in color of mucus.  + wheezing.   Skin: No-   rash or lesions. GI:  No-   heartburn, indigestion, abdominal pain, nausea, vomiting,  GU: . MS:  No-   joint pain or swelling.  + back pain. Neuro-     nothing unusual Psych:  No- change in mood or affect. No depression or anxiety.  No memory loss.  OBJ- Physical Exam General- Alert, Oriented, Affect-appropriate,  +  obese Skin- rash-none, lesions- none, excoriation-  none Lymphadenopathy- none Head- atraumatic.             Eyes- Gross vision intact, PERRLA, conjunctivae and secretions clear            Ears- +Hearing aid/ HOH            Nose- Clear, no-Septal dev, mucus, polyps, erosion, perforation             Throat- Mallampati III , mucosa clear , drainage- none, tonsils- atrophic. +Dentures Neck- flexible , trachea midline, no stridor , thyroid nl, carotid no bruit Chest - symmetrical excursion , unlabored           Heart/CV- RRR , no murmur , no gallop  , no rub, nl s1 s2                           - JVD- none , edema- none, stasis changes- none, varices- none           Lung- clear,  wheeze -none, cough- none , dullness-none, rub- none           Chest wall-  Abd-  Br/ Gen/ Rectal- Not done, not indicated Extrem- cyanosis- none, clubbing, none, atrophy- none, strength- nl Neuro-+ seems restless, shifting side to side

## 2022-06-14 ENCOUNTER — Encounter: Payer: Medicare Other | Admitting: Internal Medicine

## 2022-06-15 ENCOUNTER — Ambulatory Visit (INDEPENDENT_AMBULATORY_CARE_PROVIDER_SITE_OTHER): Payer: Medicare Other | Admitting: Family Medicine

## 2022-06-15 DIAGNOSIS — E119 Type 2 diabetes mellitus without complications: Secondary | ICD-10-CM | POA: Diagnosis not present

## 2022-06-15 DIAGNOSIS — R413 Other amnesia: Secondary | ICD-10-CM

## 2022-06-15 DIAGNOSIS — Z794 Long term (current) use of insulin: Secondary | ICD-10-CM | POA: Diagnosis not present

## 2022-06-15 NOTE — Progress Notes (Signed)
Patient ID: Max [name], male   DOB: [birth year], [id number] [id number] Called Dr. Chase Caller office as patient was late for initial appointment today (06/15/22) stating he forgot and was rescheduled for later in the morning.  At time of rescheduled appointment patient was called and wife in the background was stating they were lost in [facility] and patient did not have a recollection of my (Dr. Angus Palms name) or appointment.  Upon review of chart patient's recent partial nephrectomy was cancelled due to poorly controlled diabetes AND patient and wife being unaware of procedure and dianosis for which he was at the hospital.  After reviewing Dr. Alphonsa Overall note it appears patient is struggling with memory.  Left message with front desk Tiana  I have reviewed the above documentation for accuracy and completeness, and I agree with the above. - Reuben Likes, MD

## 2022-06-15 NOTE — Progress Notes (Deleted)
Called Dr. Lynnda Child office as patient was late for initial appointment today (06/15/22) stating he forgot and was rescheduled for later in the morning.  At time of rescheduled appointment patient was called and wife in the background was stating they were lost in [facility] and patient did not have a recollection of my (Dr. Avie Arenas name) or appointment.  Upon review of chart patient's recent partial nephrectomy was cancelled due to poorly controlled diabetes AND patient and wife being unaware of procedure and dianosis for which he was at the hospital.  After reviewing Dr. Jackson Latino note it appears patient is struggling with memory.  Left message with front desk Tiana

## 2022-06-26 IMAGING — MR MR ABDOMEN WO/W CM
12 of 19 series · 25 of 48 positions shown · IV contrast (20ml Multihance)
Comparison: 10/04/2019 ultrasound. 08/04/2021 noncontrast abdominal
CT.

CLINICAL DATA: Follow-up of left renal mass.

EXAM:
MRI ABDOMEN WITHOUT AND WITH CONTRAST
TECHNIQUE: Multiplanar multisequence MR imaging of the abdomen was performed
both before and after the administration of intravenous contrast.
CONTRAST:  20mL MULTIHANCE GADOBENATE DIMEGLUMINE 529 MG/ML IV SOLN

[Series 3: cor haste · coronal · 5.0mm · 0.68mm/px · 2 of 37 slices shown]
[im 1/37]
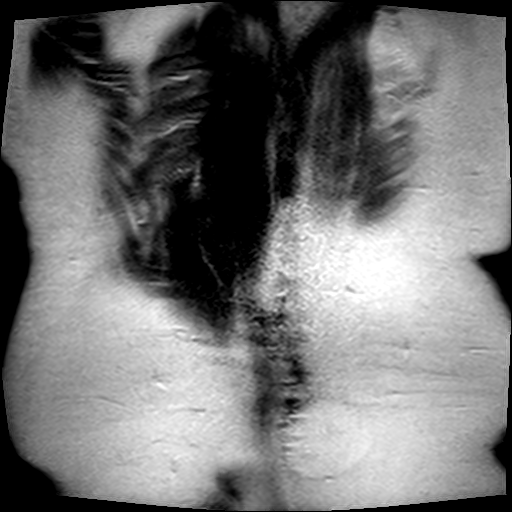
[im 37/37]
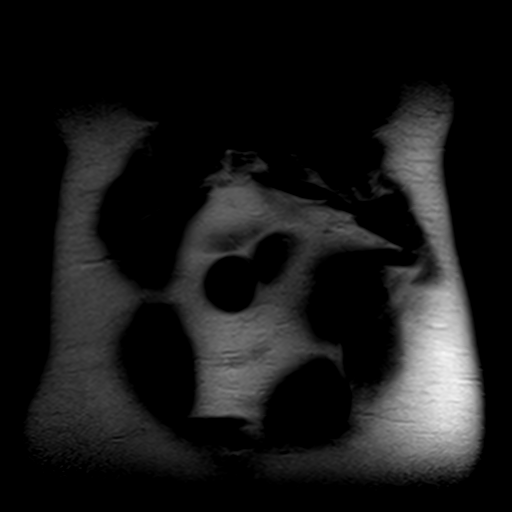

[Series 4: axial haste · axial · 6.0mm · 0.76mm/px · 1 of 40 slices shown]
[im 1/40]
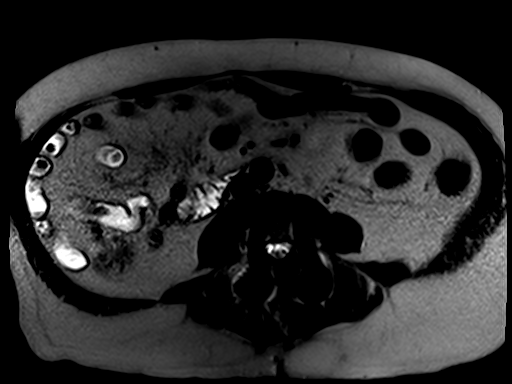

[Series 5: T1 · axial · 6.0mm · 0.76mm/px · z∈[-118,+133]mm · 2 of 78 slices shown]
[im 1/78]
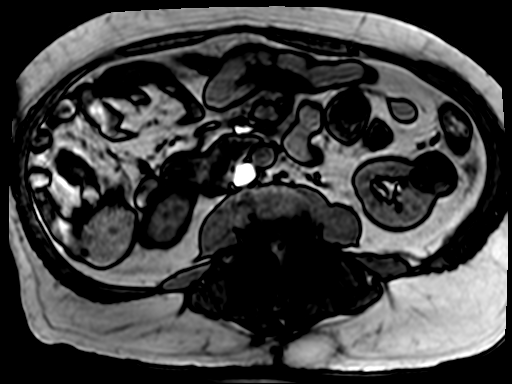
[im 78/78]
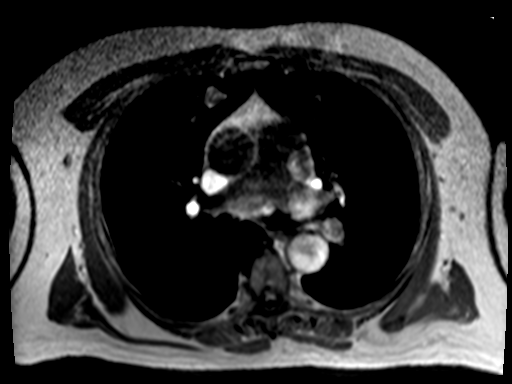

[Series 6: bSSFP · axial · 4.0mm · 0.76mm/px · z∈[-118,+138]mm · 2 of 65 slices shown]
[im 1/65]
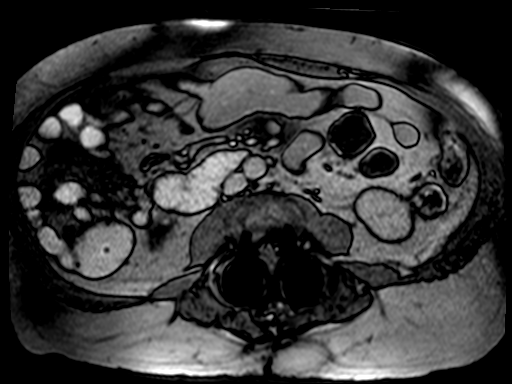
[im 65/65]
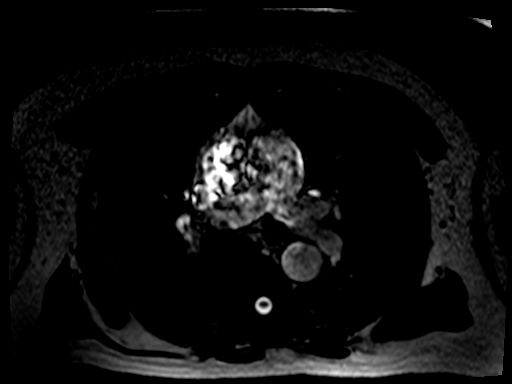

[Series 7: T2 fat-sat · axial · 6.0mm · 1.22mm/px · 1 of 38 slices shown]
[im 1/38]
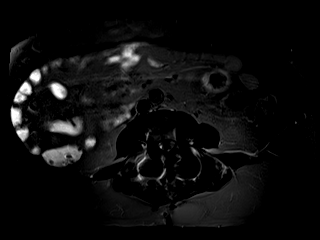

[Series 8: ep2d_diff_b50_500_800_p2_trig · axial · 6.0mm · 2.03mm/px · z∈[-122,+137]mm · 3 of 111 slices shown]
[im 1/111]
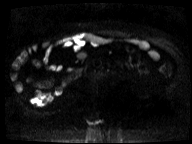
[im 56/111]
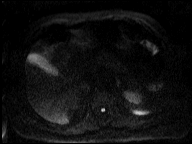
[im 111/111]
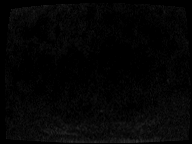

[Series 9: ep2d_diff_b50_500_800_p2_trig_adc · axial · 6.0mm · 2.03mm/px · 1 of 37 slices shown]
[im 1/37]
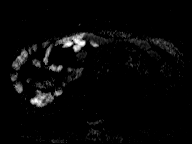

[Series 10: T1 dynamic · axial · non-contrast · 2.5mm · 0.74mm/px · z∈[-122,+136]mm · 3 of 104 slices shown]
[im 1/104]
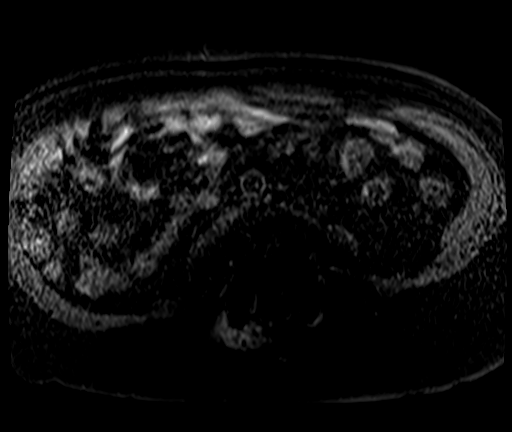
[im 52/104]
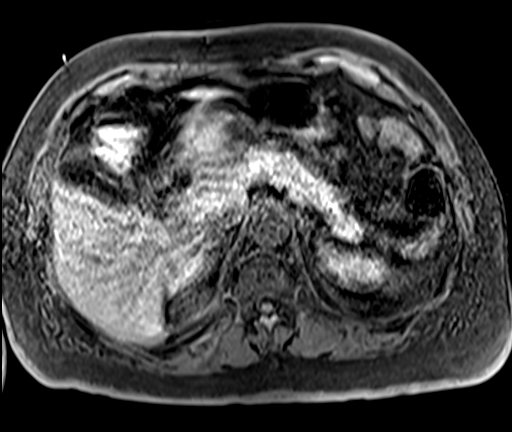
[im 104/104]
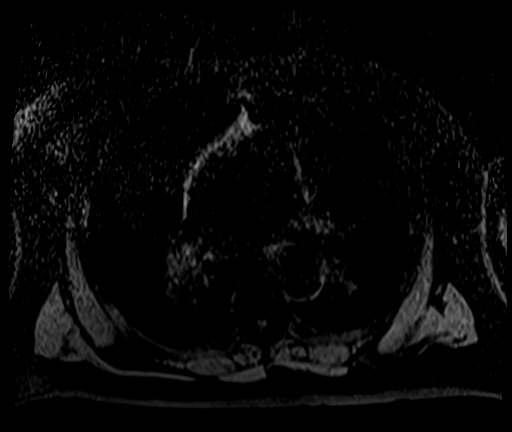

[Series 11: T1 dynamic post-contrast · axial · 2.5mm · 0.74mm/px · z∈[-122,+136]mm · 3 of 104 slices shown (1 of 4)]
[im 1/104]
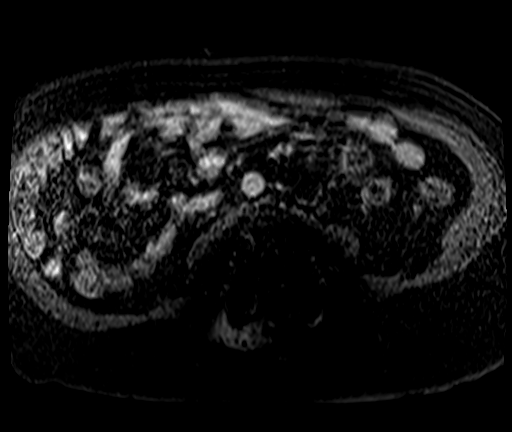
[im 52/104]
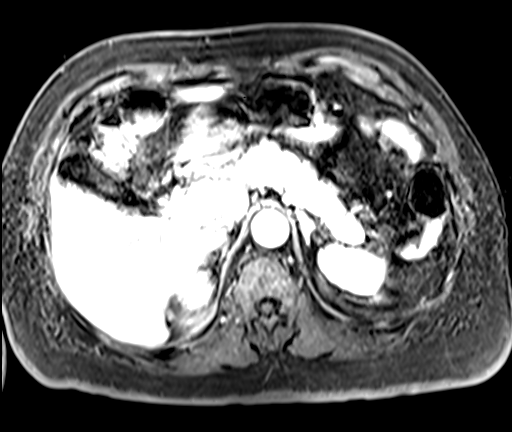
[im 104/104]
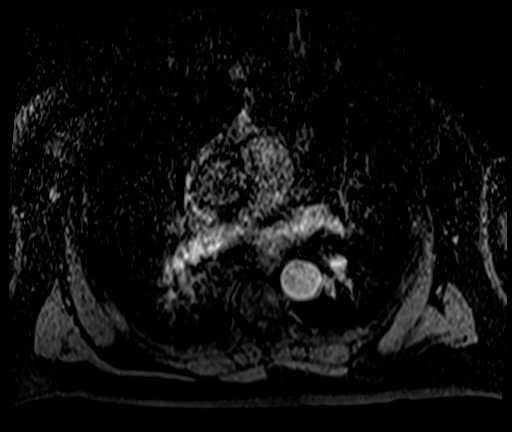

[Series 12: T1 dynamic post-contrast · axial · 2.5mm · 0.74mm/px · z∈[-122,+136]mm · 3 of 104 slices shown (2 of 4)]
[im 1/104]
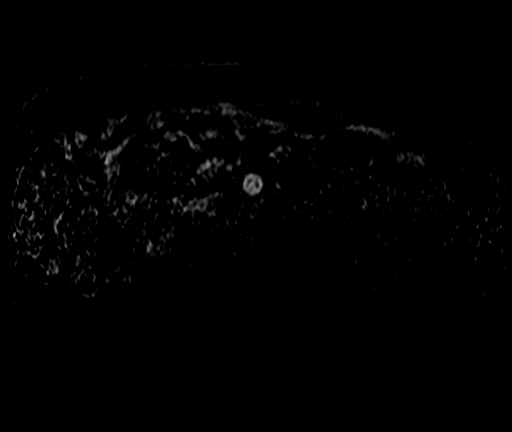
[im 52/104]
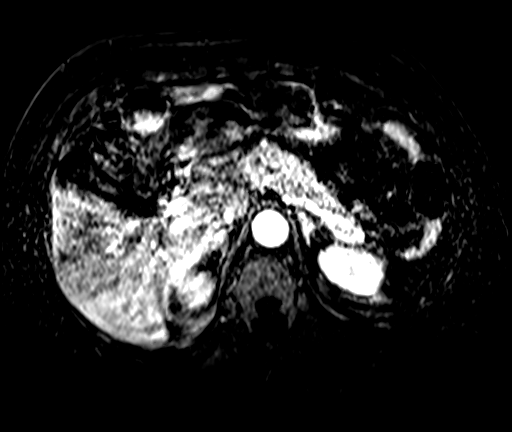
[im 104/104]
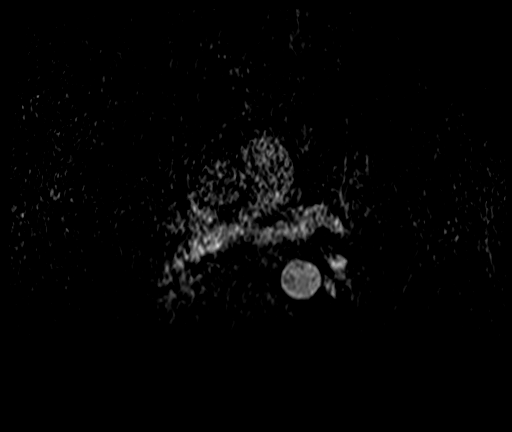

[Series 13: T1 dynamic post-contrast · axial · 2.5mm · 0.74mm/px · z∈[-122,+136]mm · 3 of 104 slices shown (3 of 4)]
[im 1/104]
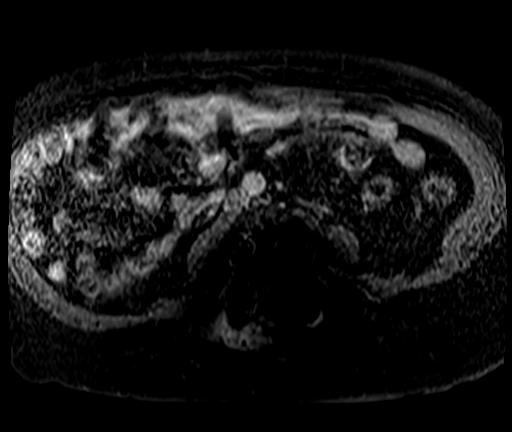
[im 52/104]
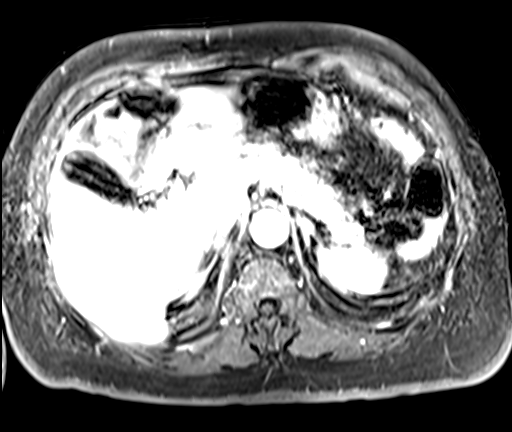
[im 104/104]
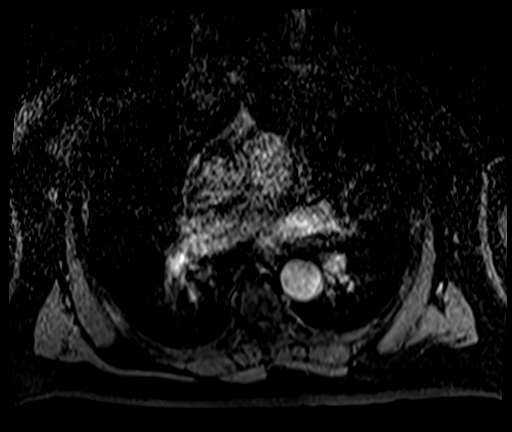

[Series 14: T1 dynamic post-contrast · axial · 2.5mm · 0.74mm/px · 1 of 104 slices shown (4 of 4)]
[im 1/104]
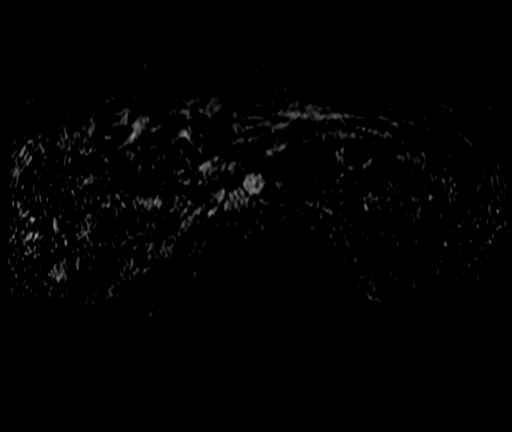

[25 of 48 positions shown; findings below may reference images not displayed]

FINDINGS: Portions of the exam, especially the pre and postcontrast dynamic
images are mild to moderately motion degraded. Motion especially
degrades the precontrast T1 weighted imaging, making evaluation for
enhancement challenging.

Lower chest: Normal heart size without pericardial or pleural
effusion.

Hepatobiliary: Tiny left hepatic lobe cyst. Normal gallbladder,
without biliary ductal dilatation.

Pancreas: Pancreas divisum, with a prominent dorsal duct entering
duodenum on [DATE]. No duct dilatation or acute inflammation.

Spleen:  Normal in size, without focal abnormality.

Adrenals/Urinary Tract: Right adrenal 1.3 cm nodule is consistent
with an adenoma on [DATE]. Exophytic left adrenal nodule laterally at
2.4 cm is also consistent with an adenoma.

Normal right kidney.

The subcentimeter questioned interpolar left renal lesion is not
identified/appreciated.

Lower pole partially exophytic left renal lesion is again
identified. Example with areas of T2 hyperintensity and T2
hypointensity measuring 3.2 x 3.4 cm on transverse image 32/7.
cm on coronal image [DATE]. Demonstrates heterogeneous T1 signal prior
to contrast, including on 39/5. Areas of apparent peripheral
enhancement on subtracted image 92/19 are indeterminate, given
motion degraded precontrast images. Precontrast T1 signal in these
regions is intermediate including on 85/10.

Stomach/Bowel: Normal stomach and abdominal bowel loops.

Vascular/Lymphatic: Aortic atherosclerosis. Abdominal adenopathy,
including jejunal mesenteric node of 1.7 cm on 86/18.

Other:  No ascites.

Musculoskeletal: Lumbar spine fixation. Right hemidiaphragm
elevation.
IMPRESSION: 1. Motion degradation, especially involving the precontrast T1
weighted images. This makes evaluation for postcontrast enhancement
challenging.
2. Complex lower pole left renal lesion with equivocal mild
peripheral postcontrast enhancement. Slightly favored to represent a
renal cell carcinoma (likely papillary type). A complex cyst is felt
less likely, but cannot be excluded given above limitations.
Potential clinical strategies include renal ultrasound with focused
evaluation of this lesion. Alternatively, further evaluation with
dedicated pre and post-contrast renal protocol abdominal CT should
be considered.
3. Abdominal adenopathy, as on CT. Cannot exclude
lymphoproliferative process. PET may be informative for further
characterization.
4. Pancreas divisum.
5. Bilateral adrenal adenomas.

## 2022-07-01 DIAGNOSIS — G4733 Obstructive sleep apnea (adult) (pediatric): Secondary | ICD-10-CM | POA: Diagnosis not present

## 2022-07-05 DIAGNOSIS — C642 Malignant neoplasm of left kidney, except renal pelvis: Secondary | ICD-10-CM | POA: Diagnosis not present

## 2022-07-06 ENCOUNTER — Ambulatory Visit: Payer: Medicare Other | Admitting: Internal Medicine

## 2022-07-07 DIAGNOSIS — M47816 Spondylosis without myelopathy or radiculopathy, lumbar region: Secondary | ICD-10-CM | POA: Diagnosis not present

## 2022-07-07 DIAGNOSIS — G894 Chronic pain syndrome: Secondary | ICD-10-CM | POA: Diagnosis not present

## 2022-07-07 DIAGNOSIS — Z79891 Long term (current) use of opiate analgesic: Secondary | ICD-10-CM | POA: Diagnosis not present

## 2022-07-15 DIAGNOSIS — Z794 Long term (current) use of insulin: Secondary | ICD-10-CM | POA: Diagnosis not present

## 2022-07-15 DIAGNOSIS — E119 Type 2 diabetes mellitus without complications: Secondary | ICD-10-CM | POA: Diagnosis not present

## 2022-07-20 ENCOUNTER — Other Ambulatory Visit: Payer: Self-pay | Admitting: Urology

## 2022-07-20 DIAGNOSIS — C642 Malignant neoplasm of left kidney, except renal pelvis: Secondary | ICD-10-CM

## 2022-07-21 ENCOUNTER — Ambulatory Visit
Admission: RE | Admit: 2022-07-21 | Discharge: 2022-07-21 | Disposition: A | Discharge status: Home / Self Care | Payer: Medicare Other | Source: Ambulatory Visit | Attending: Urology | Admitting: Urology

## 2022-07-21 DIAGNOSIS — C642 Malignant neoplasm of left kidney, except renal pelvis: Secondary | ICD-10-CM

## 2022-07-21 HISTORY — PX: IR RADIOLOGIST EVAL & MGMT: IMG5224

## 2022-07-21 NOTE — Progress Notes (Addendum)
Reason for visit: L renal mass   Care Team(s): PCP; Deatra James, MD Endocrinology; Talmage Coin, MD Urology; Winter,Christopher Clifton Custard MD Pulmonary; Waymon Budge, MD   Weight management; Langston Reusing, MD   History of Present Illness:  Max [name] is a 77 y.o. male comorbid including PMHx significant for prostate CA on surveillance, anxiety, bipolar Dx, depression, obesity (BMI 32) and metabolic syndrome with poorly controlled T2DM. Last HbA1C 9.3 (05/12/22). 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 He was in discussion for surgical resection, however due to his DM and poorly controlled A1C he was referred for minimally invasive consultation for potential ablation.   He presents to clinic with his spouse, Max [name], and I discussed with them the importance of better sBG control to minimize the risk of complications from the ablation. He denies any unintentional weight loss, hematuria or localizing symptoms.   Review of Systems: A 12-point ROS discussed, and pertinent positives are indicated in the HPI above.  All other systems are negative.  Past Medical History:  Diagnosis Date   ADD (attention deficit disorder)    Allergy    hayfever   Anxiety    Back pain    Bipolar 1 disorder (HCC)    Cataract    right eye-removed   Chronic pain    Constipation    Decreased hearing    Depression    Diabetes mellitus without complication (HCC)    Type II   Dry skin    DVT (deep venous thrombosis) (HCC) 2004   leg (not sure which leg)   Dyslipidemia    Dyspnea    with exertion   Ear drainage    Excessive hunger    Excessive thirst    Eye pain    Fatigue    Frequent urination    GERD (gastroesophageal reflux disease)    Hard of hearing    History of kidney stones    Hyperlipidemia     Hypertension    Itching    Joint pain    Lactose intolerance    Leg cramps    Mouth sores    Nasal and sinus discharge    Nausea    Nausea with vomiting 09/27/2019   Numbness and tingling    bilateral   OSA (obstructive sleep apnea)    wears CPAP   Shortness of breath    Shortness of breath on exertion    Sinus congestion    Sleep apnea    uses cpap   Stiff neck    Stress    Swallowing difficulty    Swelling of extremity    Tinnitus    Trouble in sleeping    Urinary frequency    Vision changes    Weakness    Wears glasses     Past Surgical History:  Procedure Laterality Date   arm surgery Right    BACK SURGERY  2004   COLONOSCOPY     ESOPHAGOGASTRODUODENOSCOPY     EYE SURGERY Right    cataract   IR RADIOLOGIST EVAL & MGMT  07/21/2022   SUBMANDIBULAR GLAND EXCISION Right 03/10/2016   Procedure: INTRAORAL  SUBMANDIBULAR  STONE REMOVAL;  Surgeon: Serena Colonel, MD;  Location: [facility] OR;  Service: ENT;  Laterality: Right;    Allergies: Baclofen, Butane, Other,  Topiramate, Blue dyes (parenteral), Gabapentin, Hydromorphone, Ketorolac, Poractant alfa, Red dye, Tizanidine, Buprenorphine, Pork-derived products, Latex, and Lisinopril  Medications: Prior to Admission medications   Medication Sig Start Date End Date Taking? Authorizing Provider  AMBULATORY NON FORMULARY MEDICATION Medication Name: maltrexazone 1 mcg at bedtime    [provider]  amLODipine (NORVASC) 10 MG tablet Take 10 mg by mouth daily.  06/29/12   [provider]  Blood Glucose Monitoring Suppl (ONETOUCH VERIO) w/Device KIT 1 kit by Does not apply route daily at 12 noon. Continuous Glucose Monitoring Kit 11/15/17   Langston Reusing, MD  Cholecalciferol (VITAMIN D) 125 MCG (5000 UT) CAPS Take 5,000 Units by mouth daily.    [provider]  Coenzyme Q10 (COQ10) 400 MG CAPS Take 400 mg by mouth daily.    [provider]  DULoxetine (CYMBALTA) 30 MG capsule Take 30 mg by mouth  at bedtime.    [provider]  Elastic Bandages & Supports (LUMBAR BACK BRACE/SUPPORT PAD) MISC by Does not apply route. 01/17/18   [provider]  empagliflozin (JARDIANCE) 25 MG TABS tablet Take 25 mg by mouth daily. 08/29/19   [provider]  finasteride (PROSCAR) 5 MG tablet Take 5 mg by mouth daily.    [provider]  glucose blood (ACCU-CHEK AVIVA) test strip Use as instructed 10/13/17   Langston Reusing, MD  hydrochlorothiazide (HYDRODIURIL) 25 MG tablet Take 25 mg by mouth daily.    [provider]  HYDROcodone-acetaminophen (NORCO) 7.5-325 MG tablet Take 1 tablet by mouth in the morning, at noon, in the evening, and at bedtime. 12/08/21   [provider]  Lancets (ACCU-CHEK SAFE-T PRO) lancets Use as instructed 10/13/17   Langston Reusing, MD  losartan (COZAAR) 100 MG tablet Take 100 mg by mouth daily.    [provider]  magnesium oxide (MAG-OX) 400 (240 Mg) MG tablet Take 400 mg by mouth daily.    [provider]  OVER THE COUNTER MEDICATION Take 1 tablet by mouth daily. Focus Factor    [provider]  OVER THE COUNTER MEDICATION Take 1 tablet by mouth daily. Neuriva    [provider]  OVER THE COUNTER MEDICATION Take 2 capsules by mouth daily. OMEGA XL    [provider]  OVER THE COUNTER MEDICATION Take 2 capsules by mouth daily. Ideal Prostate    [provider]  Probiotic Product (PROBIOTIC 10 ULTRA STRENGTH PO) Take 1 capsule by mouth daily.    [provider]  rosuvastatin (CRESTOR) 10 MG tablet Take 10 mg by mouth daily. 10/20/21   [provider]  Selenium 200 MCG CAPS Take 200 mcg by mouth daily.    [provider]  tirzepatide Greggory Keen) 2.5 MG/0.5ML Pen Inject 2.5 mg into the skin once a week.    [provider]  tiZANidine (ZANAFLEX) 2 MG tablet Take 2 mg by mouth every 8 (eight) hours as needed for muscle spasms. 06/05/19    [provider]     Family History  Problem Relation Age of Onset   Diabetes Mother    Hypertension Mother    Hyperlipidemia Mother    Kidney disease Mother    Cancer Mother    Breast cancer Mother    Diabetes Brother    Breast cancer Sister    Breast cancer Sister    Colon cancer Neg Hx    Colon polyps Neg Hx    Esophageal cancer Neg Hx  Rectal cancer Neg Hx    Stomach cancer Neg Hx     Social History   Socioeconomic History   Marital status: Married    Spouse name: Diplomatic Services operational officer   Number of children: Not on file   Years of education: Not on file   Highest education level: Not on file  Occupational History   Occupation: Disabled  Tobacco Use   Smoking status: Never   Smokeless tobacco: Never  Vaping Use   Vaping Use: Never used  Substance and Sexual Activity   Alcohol use: No   Drug use: No   Sexual activity: Not on file  Other Topics Concern   Not on file  Social History Narrative   Not on file   Social Determinants of Health   Financial Resource Strain: Not on file  Food Insecurity: Not on file  Transportation Needs: Not on file  Physical Activity: Not on file  Stress: Not on file  Social Connections: Not on file    ECOG Status: 0 - Asymptomatic  Review of Systems As above  Vital Signs: BP (!) 150/73   Pulse 74   Temp 98.3 F (36.8 C) (Oral)   SpO2 95% Comment: room air  Physical Exam  General: WN, NAD  CV: RRR on monitor Pulm: normal work of breathing on RA Abd: S, ND, NT MSK: Grossly normal Psych: Appropriate affect.  Imaging:  CT AP, 02/02/22 Independently reviewed, showing enhancing, 3.4 cm L exophytic mid pole renal mass     Labs:  CBC: Recent Labs    02/17/22 0653 05/12/22 1242  WBC 9.3 6.7  HGB 15.1 13.9  HCT 45.4 44.5  PLT 194 199    COAGS: Recent Labs    02/17/22 0653  INR 1.0    BMP: Recent Labs    05/12/22 1242  NA 136  K 3.9  CL 103  CO2 23  GLUCOSE 408*  BUN 16  CALCIUM 8.5*   CREATININE 0.80  GFRNONAA >60    Pathology: SURGICAL PATHOLOGY CASE: [id number] PATIENT: [facility] Surgical Pathology Report  Clinical History: indeterminate left renal lesion (cm)  FINAL MICROSCOPIC DIAGNOSIS:  A. KIDNEY, LEFT, NEEDLE CORE BIOPSY: - PAPILLARY RENAL CELL CARCINOMA, GRADE 2.   Assessment and Plan:  77 y.o. male comorbid including PMHx significant for prostate CA on surveillance, depression, obesity (BMI 32) and metabolic syndrome with T2DM. Last HbA1C 9.3 (05/12/22). Baseline sCr < 1.  Incidentally found 3 cm L renal mass, Bx proven papillary RCC Pt was followed by urology but declined for surgical resection given his poorly controlled A1C   He is interested in pursuing a minimally-invasive option for the treatment of his renal mass at this time, and is curious about percutaneous ablation  *multiphase MR (08/13/21) and CT AP (02/02/22) reviewed.  Staging CT CAP ordered by his Urologist. Will follow up on imaging. *I stressed the importance of sBG control for complication reduction.  Recommend endocrinology follow up and A1c optimization. Ideally ~7 *comorbid, and will require cardiac clearance.  *pre-anesthesia evaluation *Labs (CBC, CMP, Coags) *once cleared, OK to proceed to schedule for LEFT RENAL MASS CRYOABLATION in approximately 1 month from date *Anticipate overnight admission post procedure, with potential early release if procedure is performed early and is uneventful    The procedure has been fully reviewed with the patient/patient's authorized representative. The risks, benefits and alternatives have been explained, and the patient/patient's authorized representative has consented to the procedure.    Thank you for this interesting consult.  I  greatly enjoyed meeting Max [name] and look forward to participating in their care.  A copy of this report was sent to the requesting provider on this date.  Electronically Signed:  Roanna Banning, MD [facility] Radiology   Pager. [phone] Clinic. [phone]  I spent a total of  60 Minutes  in face to face in clinical consultation, greater than 50% of which was counseling/coordinating care for Mr Raul Torrance Minella's renal cancer.

## 2022-07-22 DIAGNOSIS — E1142 Type 2 diabetes mellitus with diabetic polyneuropathy: Secondary | ICD-10-CM | POA: Diagnosis not present

## 2022-07-22 DIAGNOSIS — G894 Chronic pain syndrome: Secondary | ICD-10-CM | POA: Diagnosis not present

## 2022-07-22 DIAGNOSIS — E1169 Type 2 diabetes mellitus with other specified complication: Secondary | ICD-10-CM | POA: Diagnosis not present

## 2022-07-22 DIAGNOSIS — Z794 Long term (current) use of insulin: Secondary | ICD-10-CM | POA: Diagnosis not present

## 2022-07-22 DIAGNOSIS — E1165 Type 2 diabetes mellitus with hyperglycemia: Secondary | ICD-10-CM | POA: Diagnosis not present

## 2022-07-22 DIAGNOSIS — M47816 Spondylosis without myelopathy or radiculopathy, lumbar region: Secondary | ICD-10-CM | POA: Diagnosis not present

## 2022-07-22 DIAGNOSIS — F32A Depression, unspecified: Secondary | ICD-10-CM | POA: Diagnosis not present

## 2022-07-25 NOTE — Progress Notes (Signed)
HPI  male never smoker followed for OSA, complicated by DM, hx DVT, HBP, morbid obesity -------------------     07/26/22- 77 year old male never smoker followed for OSA, Asthma, complicated by DM2, hx DVT, HTN,  morbid obesity, Hyperlipidemia, LRenal Cell Cancer,  CPAP auto 8-20/ [employer] Download-compliance 100%, AHI 4.8/ hr Body weight today-226 lbs Download reviewed.  He sleeps better with CPAP and denies problems.  We reviewed comfort issues.  He is not yet eligible for replacement machine. Asthma has been controlled, uncomplicated, not needing inhaler.  ROS-see HPI   + = positive Constitutional:   + weight loss, night sweats, fevers, chills, +fatigue, lassitude. HEENT:   No-  headaches, difficulty swallowing, tooth/dental problems, sore throat,       No-  sneezing, itching, ear ache, +nasal congestion, post nasal drip,  CV:  No-   chest pain, orthopnea, PND, swelling in lower extremities, anasarca, dizziness, palpitations Resp: No-   shortness of breath with exertion or at rest.              No-   productive cough,  No non-productive cough,  No- coughing up of blood.              No-   change in color of mucus.  + wheezing.   Skin: No-   rash or lesions. GI:  No-   heartburn, indigestion, abdominal pain, nausea, vomiting,  GU: . MS:  No-   joint pain or swelling.  + back pain. Neuro-     nothing unusual Psych:  No- change in mood or affect. No depression or anxiety.  No memory loss.  OBJ- Physical Exam General- Alert, Oriented, Affect-appropriate,  +  obese Skin- rash-none, lesions- none, excoriation- none Lymphadenopathy- none Head- atraumatic.             Eyes- Gross vision intact, PERRLA, conjunctivae and secretions clear            Ears- +Hearing aid/ HOH            Nose- Clear, no-Septal dev, mucus, polyps, erosion, perforation             Throat- Mallampati III , mucosa clear , drainage- none, tonsils- atrophic. +Dentures Neck- flexible , trachea midline, no stridor ,  thyroid nl, carotid no bruit Chest - symmetrical excursion , unlabored           Heart/CV- RRR , no murmur , no gallop  , no rub, nl s1 s2                           - JVD- none , edema- none, stasis changes- none, varices- none           Lung- clear,  wheeze -none, cough- none , dullness-none, rub- none           Chest wall-  Abd-  Br/ Gen/ Rectal- Not done, not indicated Extrem- cyanosis- none, clubbing, none, atrophy- none, strength- nl Neuro-

## 2022-07-26 ENCOUNTER — Ambulatory Visit: Payer: Medicare Other | Admitting: Internal Medicine

## 2022-07-26 ENCOUNTER — Encounter: Payer: Self-pay | Admitting: Internal Medicine

## 2022-07-26 VITALS — BP 138/66 | HR 102 | Ht 71.0 in | Wt 226.2 lb

## 2022-07-26 DIAGNOSIS — J4520 Mild intermittent asthma, uncomplicated: Secondary | ICD-10-CM | POA: Diagnosis not present

## 2022-07-26 DIAGNOSIS — G4733 Obstructive sleep apnea (adult) (pediatric): Secondary | ICD-10-CM | POA: Diagnosis not present

## 2022-07-26 NOTE — Patient Instructions (Signed)
You are doing well with CPAP. We can continue auto 8-20  Please call if we can help

## 2022-07-28 ENCOUNTER — Other Ambulatory Visit: Payer: Self-pay | Admitting: Interventional Radiology

## 2022-07-28 ENCOUNTER — Telehealth: Payer: Self-pay | Admitting: *Deleted

## 2022-07-28 NOTE — Telephone Encounter (Signed)
   Pre-operative Risk Assessment    Patient Name: Max [name]  DOB: 20-May-1945 MRN: [id number]    PT IS BEING REFERRED TO CARDIOLOGY FOR PRE OP CLEARANCE, I WILL SEND A MESSAGE TO CHART PREP AND OUR SCHEDULING TEAM TO SET UP NEW PT APPT.   Request for Surgical Clearance    Procedure:   RENAL CRYO ABLATION  Date of Surgery:  Clearance TBD                                 Surgeon:  DR. Roanna Banning Surgeon's Group or Practice Name:  [facility] Phone number:  [phone] ATTN: JENNIFER Fax number:  [phone]   Type of Clearance Requested:   - Medical ; NO MEDICATIONS LISTED AS NEEDING TO BE HELD   Type of Anesthesia:  General    Additional requests/questions:    Elpidio Anis   07/28/2022, 1:22 PM

## 2022-07-29 NOTE — Telephone Encounter (Signed)
Pt has new pt appt 08/12/22 with Dr. Carolan Clines. I will update all parties involved.

## 2022-08-12 ENCOUNTER — Ambulatory Visit: Payer: Medicare Other | Attending: Internal Medicine | Admitting: Internal Medicine

## 2022-08-12 VITALS — BP 122/82 | HR 85 | Ht 71.0 in | Wt 222.4 lb

## 2022-08-12 DIAGNOSIS — Z01810 Encounter for preprocedural cardiovascular examination: Secondary | ICD-10-CM

## 2022-08-12 DIAGNOSIS — C642 Malignant neoplasm of left kidney, except renal pelvis: Secondary | ICD-10-CM | POA: Diagnosis not present

## 2022-08-12 DIAGNOSIS — N2889 Other specified disorders of kidney and ureter: Secondary | ICD-10-CM | POA: Diagnosis not present

## 2022-08-12 DIAGNOSIS — I700 Atherosclerosis of aorta: Secondary | ICD-10-CM | POA: Diagnosis not present

## 2022-08-12 NOTE — Patient Instructions (Signed)
Medication Instructions:  No Changes In Medications at this time.  *If you need a refill on your cardiac medications before your next appointment, please call your pharmacy*  Lab Work: None Ordered At This Time.   If you have labs (blood work) drawn today and your tests are completely normal, you will receive your results only by: MyChart Message (if you have MyChart) OR A paper copy in the mail If you have any lab test that is abnormal or we need to change your treatment, we will call you to review the results.  Testing/Procedures: None Ordered At This Time.   Follow-Up: At  [facility], you and your health needs are our priority.  As part of our continuing mission to provide you with exceptional heart care, we have created designated Provider Care Teams.  These Care Teams include your primary Cardiologist (physician) and Advanced Practice Providers (APPs -  Physician Assistants and Nurse Practitioners) who all work together to provide you with the care you need, when you need it.   Your next appointment:   AS NEEDED   Provider:   Branch, Mary E, MD     

## 2022-08-12 NOTE — Progress Notes (Signed)
Cardiology Office Note:    Date:  08/12/2022   ID:  Ricci Barker, DOB 1945/09/01, MRN [id number]  PCP:  Deatra James, MD   [facility] Providers Cardiologist:  Maisie Fus, MD     Referring MD: Deatra James, MD   No chief complaint on file. Pre-OP  History of Present Illness:    Max [name] is a 77 y.o. male with a hx of DM2, bipolar disorder,  DVT, OSA on CPAP referral for pre-op for robotic assisted partial nephrectomy His surgery was canceled prior last time 2/2 A1c 9.3 Prior EKG, no significant scar pattern No prior cardiac hx. No prior cardiac w/u.  He walks his dog for exercise. He denies SOB and CP. Has chronic back pain.  He can travel 1 flight of stairs. No palpitations, no syncope Blood pressure is well controlled Notes diet is bland, no etoh or cigarette smoking. No red meat  Past Medical History:  Diagnosis Date   ADD (attention deficit disorder)    Allergy    hayfever   Anxiety    Back pain    Bipolar 1 disorder (HCC)    Cataract    right eye-removed   Chronic pain    Constipation    Decreased hearing    Depression    Diabetes mellitus without complication (HCC)    Type II   Dry skin    DVT (deep venous thrombosis) (HCC) 2004   leg (not sure which leg)   Dyslipidemia    Dyspnea    with exertion   Ear drainage    Excessive hunger    Excessive thirst    Eye pain    Fatigue    Frequent urination    GERD (gastroesophageal reflux disease)    Hard of hearing    History of kidney stones    Hyperlipidemia    Hypertension    Itching    Joint pain    Lactose intolerance    Leg cramps    Mouth sores    Nasal and sinus discharge    Nausea    Nausea with vomiting 09/27/2019   Numbness and tingling    bilateral   OSA (obstructive sleep apnea)    wears CPAP   Shortness of breath    Shortness of breath on exertion    Sinus congestion    Sleep apnea    uses cpap   Stiff neck    Stress    Swallowing difficulty     Swelling of extremity    Tinnitus    Trouble in sleeping    Urinary frequency    Vision changes    Weakness    Wears glasses     Past Surgical History:  Procedure Laterality Date   arm surgery Right    BACK SURGERY  2004   COLONOSCOPY     ESOPHAGOGASTRODUODENOSCOPY     EYE SURGERY Right    cataract   IR RADIOLOGIST EVAL & MGMT  07/21/2022   SUBMANDIBULAR GLAND EXCISION Right 03/10/2016   Procedure: INTRAORAL  SUBMANDIBULAR  STONE REMOVAL;  Surgeon: Serena Colonel, MD;  Location: [facility] OR;  Service: ENT;  Laterality: Right;    Current Medications: Current Outpatient Medications on File Prior to Visit  Medication Sig Dispense Refill   AMBULATORY NON FORMULARY MEDICATION Medication Name: maltrexazone 1 mcg at bedtime     amLODipine (NORVASC) 10 MG tablet Take 10 mg by mouth daily.      Blood Glucose Monitoring Suppl [facility]  VERIO) w/Device KIT 1 kit by Does not apply route daily at 12 noon. Continuous Glucose Monitoring Kit 1 kit 0   Cholecalciferol (VITAMIN D) 125 MCG (5000 UT) CAPS Take 5,000 Units by mouth daily.     Coenzyme Q10 (COQ10) 400 MG CAPS Take 400 mg by mouth daily.     DULoxetine (CYMBALTA) 30 MG capsule Take 30 mg by mouth at bedtime.     Elastic Bandages & Supports (LUMBAR BACK BRACE/SUPPORT PAD) MISC by Does not apply route.     empagliflozin (JARDIANCE) 25 MG TABS tablet Take 25 mg by mouth daily.     finasteride (PROSCAR) 5 MG tablet Take 5 mg by mouth daily.     glucose blood (ACCU-CHEK AVIVA) test strip Use as instructed 100 each 0   hydrochlorothiazide (HYDRODIURIL) 25 MG tablet Take 25 mg by mouth daily.     HYDROcodone-acetaminophen (NORCO) 7.5-325 MG tablet Take 1 tablet by mouth in the morning, at noon, in the evening, and at bedtime.     Lancets (ACCU-CHEK SAFE-T PRO) lancets Use as instructed 100 each 0   losartan (COZAAR) 100 MG tablet Take 100 mg by mouth daily.     magnesium oxide (MAG-OX) 400 (240 Mg) MG tablet Take 400 mg by mouth daily.     OVER THE  COUNTER MEDICATION Take 1 tablet by mouth daily. Focus Factor     OVER THE COUNTER MEDICATION Take 1 tablet by mouth daily. Neuriva     OVER THE COUNTER MEDICATION Take 2 capsules by mouth daily. OMEGA XL     OVER THE COUNTER MEDICATION Take 2 capsules by mouth daily. Ideal Prostate     Probiotic Product (PROBIOTIC 10 ULTRA STRENGTH PO) Take 1 capsule by mouth daily.     rosuvastatin (CRESTOR) 10 MG tablet Take 10 mg by mouth daily.     Selenium 200 MCG CAPS Take 200 mcg by mouth daily.     tirzepatide [facility]) 2.5 MG/0.5ML Pen Inject 2.5 mg into the skin once a week.     tiZANidine (ZANAFLEX) 2 MG tablet Take 2 mg by mouth every 8 (eight) hours as needed for muscle spasms.     No current facility-administered medications on file prior to visit.    Allergies:   Baclofen, Butane, Other, Topiramate, Blue dyes (parenteral), Gabapentin, Hydromorphone, Ketorolac, Poractant alfa, Red dye, Tizanidine, Buprenorphine, Pork-derived products, Latex, and Lisinopril   Social History   Socioeconomic History   Marital status: Married    Spouse name: Diplomatic Services operational officer   Number of children: Not on file   Years of education: Not on file   Highest education level: Not on file  Occupational History   Occupation: Disabled  Tobacco Use   Smoking status: Never   Smokeless tobacco: Never  Vaping Use   Vaping Use: Never used  Substance and Sexual Activity   Alcohol use: No   Drug use: No   Sexual activity: Not on file  Other Topics Concern   Not on file  Social History Narrative   Not on file   Social Determinants of Health   Financial Resource Strain: Not on file  Food Insecurity: Not on file  Transportation Needs: Not on file  Physical Activity: Not on file  Stress: Not on file  Social Connections: Not on file     Family History: The patient's family history includes Breast cancer in his mother, sister, and sister; Cancer in his mother; Diabetes in his brother and mother; Hyperlipidemia in his  mother; Hypertension in  his mother; Kidney disease in his mother. There is no history of Colon cancer, Colon polyps, Esophageal cancer, Rectal cancer, or Stomach cancer.  ROS:   Please see the history of present illness.     All other systems reviewed and are negative.  EKGs/Labs/Other Studies Reviewed:    The following studies were reviewed today:   EKG:  EKG is  ordered today.  The ekg ordered today demonstrates   08/12/2022-  NSR, PVCs , LBBB localized   Recent Labs: 05/12/2022: BUN 16; Creatinine, Ser 0.80; Hemoglobin 13.9; Platelets 199; Potassium 3.9; Sodium 136   Recent Lipid Panel    Component Value Date/Time   CHOL 171 01/03/2020 0924   TRIG 90 01/03/2020 0924   HDL 51 01/03/2020 0924   CHOLHDL 3.8 Ratio 01/09/2008 2228   VLDL 29 01/09/2008 2228   LDLCALC 103 (H) 01/03/2020 0924     Risk Assessment/Calculations:    Physical Exam:    VS:  BP 122/82   Pulse 85   Ht 5\' 11"  (1.803 m)   Wt 222 lb 6.4 oz (100.9 kg)   SpO2 96%   BMI 31.02 kg/m     Wt Readings from Last 3 Encounters:  08/12/22 222 lb 6.4 oz (100.9 kg)  07/26/22 226 lb 3.2 oz (102.6 kg)  05/12/22 222 lb (100.7 kg)     GEN:  Well nourished, well developed in no acute distress HEENT: Normal NECK: No JVD CARDIAC: RRR, no murmurs, rubs, gallops RESPIRATORY:  Clear to auscultation without rales, wheezing or rhonchi  ABDOMEN: Soft, non-tender, non-distended MUSCULOSKELETAL:  No edema; No deformity  SKIN: Warm and dry NEUROLOGIC:  Alert and oriented x 3 PSYCHIATRIC:  Normal affect   ASSESSMENT:   Pre-OP: Mr. Iwanski presents for pre-op for robotic assisted partial nephrectomy. He can walk > 4 METS without shortness of breath or chest pressure. Does have some ventricular ectopy localized near the left bundle, he is asymptomatic. If symptoms arise and PVCs are persistent, I'm happy to see him back.  Otherwise, he is in normal sinus rhythm. His risk of an adverse cardiac event is not significant.    DM2: on SGLT2. Continue statin. Discussed A1c goal <7  HTN: continue current medication PLAN:    In order of problems listed above:  Acceptable cardiac risk for robotic assisted partial nephrectomy Follow up as needed      Medication Adjustments/Labs and Tests Ordered: Current medicines are reviewed at length with the patient today.  Concerns regarding medicines are outlined above.  Orders Placed This Encounter  Procedures   EKG 12-Lead   No orders of the defined types were placed in this encounter.   Patient Instructions  Medication Instructions:  No Changes In Medications at this time.   *If you need a refill on your cardiac medications before your next appointment, please call your pharmacy*  Lab Work: None Ordered At This Time.   If you have labs (blood work) drawn today and your tests are completely normal, you will receive your results only by: MyChart Message (if you have MyChart) OR A paper copy in the mail If you have any lab test that is abnormal or we need to change your treatment, we will call you to review the results.  Testing/Procedures: None Ordered At This Time.    Follow-Up: At [facility], you and your health needs are our priority.  As part of our continuing mission to provide you with exceptional heart care, we have created designated Provider Care Teams.  These Care Teams include your primary Cardiologist (physician) and Advanced Practice Providers (APPs -  Physician Assistants and Nurse Practitioners) who all work together to provide you with the care you need, when you need it.  Your next appointment:   AS NEEDED   Provider:   Maisie Fus, MD     Signed, Maisie Fus, MD  08/12/2022 3:48 PM    [facility]

## 2022-08-14 DIAGNOSIS — Z794 Long term (current) use of insulin: Secondary | ICD-10-CM | POA: Diagnosis not present

## 2022-08-14 DIAGNOSIS — E119 Type 2 diabetes mellitus without complications: Secondary | ICD-10-CM | POA: Diagnosis not present

## 2022-08-17 ENCOUNTER — Other Ambulatory Visit (HOSPITAL_COMMUNITY): Payer: Self-pay | Admitting: Interventional Radiology

## 2022-08-17 DIAGNOSIS — N2889 Other specified disorders of kidney and ureter: Secondary | ICD-10-CM

## 2022-08-18 DIAGNOSIS — F32A Depression, unspecified: Secondary | ICD-10-CM | POA: Diagnosis not present

## 2022-08-18 DIAGNOSIS — G894 Chronic pain syndrome: Secondary | ICD-10-CM | POA: Diagnosis not present

## 2022-08-18 DIAGNOSIS — M47816 Spondylosis without myelopathy or radiculopathy, lumbar region: Secondary | ICD-10-CM | POA: Diagnosis not present

## 2022-08-25 ENCOUNTER — Encounter: Payer: Self-pay | Admitting: Internal Medicine

## 2022-08-25 ENCOUNTER — Other Ambulatory Visit: Payer: Self-pay | Admitting: Interventional Radiology

## 2022-08-25 DIAGNOSIS — N2889 Other specified disorders of kidney and ureter: Secondary | ICD-10-CM

## 2022-08-25 NOTE — Assessment & Plan Note (Signed)
Benefits from CPAP with good compliance and control.  No problems identified Plan-continue auto 8-20

## 2022-08-25 NOTE — Assessment & Plan Note (Signed)
Mild intermittent uncomplicated.  Not needing inhalers.

## 2022-08-26 ENCOUNTER — Ambulatory Visit
Admission: RE | Admit: 2022-08-26 | Discharge: 2022-08-26 | Disposition: A | Discharge status: Home / Self Care | Payer: Medicare Other | Source: Ambulatory Visit | Attending: Interventional Radiology | Admitting: Interventional Radiology

## 2022-08-26 DIAGNOSIS — N2889 Other specified disorders of kidney and ureter: Secondary | ICD-10-CM | POA: Diagnosis not present

## 2022-08-26 HISTORY — PX: IR RADIOLOGIST EVAL & MGMT: IMG5224

## 2022-08-26 NOTE — Progress Notes (Signed)
Reason for visit: L renal mass. Imaging follow up.   Care Team(s): PCP; Deatra James, MD Endocrinology; Talmage Coin, MD Urology; Winter,Christopher Clifton Custard MD Pulmonary; Waymon Budge, MD   Weight management; Langston Reusing, MD  Cardiology; Maisie Fus, MD  Virtual Visit via Telephone Note   I connected with Max. Max [name] on 08/26/22 by telephone and verified that I am speaking with the correct person using two identifiers. I discussed the limitations, risks, security and privacy concerns of performing an evaluation and management service by telephone and the availability of in-person appointments. They are joined in the call by his wife, Max [name].   History of Present Illness:   Max [name] is a 77 y.o. male comorbid including PMHx significant for prostate CA on surveillance, anxiety, bipolar Dx, depression, obesity (BMI 32) and metabolic syndrome with poorly controlled T2DM. Last HbA1C 9.3 (05/12/22). 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 He was in discussion for surgical resection, however due to his DM and poorly controlled A1C he was referred for minimally invasive consultation for potential ablation.    I had initially met with Max [name] on 07/21/22 in person and discussed the specifics of his pathology and treatment options available, settling on percutaneous cryoablation. I had stressed to him the importance of better sBG control to minimize the risk of complications from the ablation. He has since had his cardiac clearance by Dr Wyline Mood, and has met with Dr Sharl Ma for diabetes management. Him and his wife report to be rather forgetful and they requested [name] talking through the cryoablation. They reiterated that they do not wish to pursue the surgical option with potential  (partial) nephrectomy at this time. They report a follow up visit with Dr Liliane Shi in the near term. He denies any change to his health status since our last meeting, unintentional weight loss, hematuria or localizing symptoms.   Review of Systems: A 12-point ROS discussed, and pertinent positives are indicated in the HPI above.  All other systems are negative.   Past Medical History:  Diagnosis Date   ADD (attention deficit disorder)    Allergy    hayfever   Anxiety    Back pain    Bipolar 1 disorder (HCC)    Cataract    right eye-removed   Chronic pain    Constipation    Decreased hearing    Depression    Diabetes mellitus without complication (HCC)    Type II   Dry skin    DVT (deep venous thrombosis) (HCC) 2004   leg (not sure which leg)   Dyslipidemia    Dyspnea    with exertion   Ear drainage    Excessive hunger    Excessive thirst    Eye pain    Fatigue    Frequent urination    GERD (gastroesophageal reflux disease)    Hard of hearing    History of kidney stones    Hyperlipidemia    Hypertension    Itching    Joint pain    Lactose intolerance    Leg cramps    Mouth sores    Nasal and sinus discharge    Nausea    Nausea with vomiting 09/27/2019   Numbness and tingling    bilateral  OSA (obstructive sleep apnea)    wears CPAP   Shortness of breath    Shortness of breath on exertion    Sinus congestion    Sleep apnea    uses cpap   Stiff neck    Stress    Swallowing difficulty    Swelling of extremity    Tinnitus    Trouble in sleeping    Urinary frequency    Vision changes    Weakness    Wears glasses     Past Surgical History:  Procedure Laterality Date   arm surgery Right    BACK SURGERY  2004   COLONOSCOPY     ESOPHAGOGASTRODUODENOSCOPY     EYE SURGERY Right    cataract   IR RADIOLOGIST EVAL & MGMT  07/21/2022   SUBMANDIBULAR GLAND EXCISION Right 03/10/2016   Procedure: INTRAORAL  SUBMANDIBULAR  STONE REMOVAL;  Surgeon: Serena Colonel,  MD;  Location: [facility] OR;  Service: ENT;  Laterality: Right;    Allergies: Baclofen, Butane, Other, Topiramate, Blue dyes (parenteral), Gabapentin, Hydromorphone, Ketorolac, Poractant alfa, Red dye, Tizanidine, Buprenorphine, Pork-derived products, Latex, and Lisinopril  Medications: Prior to Admission medications   Medication Sig Start Date End Date Taking? Authorizing Provider  AMBULATORY NON FORMULARY MEDICATION Medication Name: maltrexazone 1 mcg at bedtime    [provider]  amLODipine (NORVASC) 10 MG tablet Take 10 mg by mouth daily.  06/29/12   [provider]  Blood Glucose Monitoring Suppl (ONETOUCH VERIO) w/Device KIT 1 kit by Does not apply route daily at 12 noon. Continuous Glucose Monitoring Kit 11/15/17   Langston Reusing, MD  Cholecalciferol (VITAMIN D) 125 MCG (5000 UT) CAPS Take 5,000 Units by mouth daily.    [provider]  Coenzyme Q10 (COQ10) 400 MG CAPS Take 400 mg by mouth daily.    [provider]  DULoxetine (CYMBALTA) 30 MG capsule Take 30 mg by mouth at bedtime.    [provider]  Elastic Bandages & Supports (LUMBAR BACK BRACE/SUPPORT PAD) MISC by Does not apply route. 01/17/18   [provider]  empagliflozin (JARDIANCE) 25 MG TABS tablet Take 25 mg by mouth daily. 08/29/19   [provider]  finasteride (PROSCAR) 5 MG tablet Take 5 mg by mouth daily.    [provider]  glucose blood (ACCU-CHEK AVIVA) test strip Use as instructed 10/13/17   Langston Reusing, MD  hydrochlorothiazide (HYDRODIURIL) 25 MG tablet Take 25 mg by mouth daily.    [provider]  HYDROcodone-acetaminophen (NORCO) 7.5-325 MG tablet Take 1 tablet by mouth in the morning, at noon, in the evening, and at bedtime. 12/08/21   [provider]  Lancets (ACCU-CHEK SAFE-T PRO) lancets Use as instructed 10/13/17   Langston Reusing, MD  losartan (COZAAR) 100 MG tablet Take 100 mg by mouth daily.    [provider]  magnesium oxide (MAG-OX) 400 (240 Mg) MG tablet Take 400 mg by mouth daily.    [provider]  OVER THE COUNTER MEDICATION Take 1 tablet by mouth daily. Focus Factor    [provider]  OVER THE COUNTER MEDICATION Take 1 tablet by mouth daily. Neuriva    [provider]  OVER THE COUNTER MEDICATION Take 2 capsules by mouth daily. OMEGA XL    [provider]  OVER THE COUNTER MEDICATION Take 2 capsules by mouth daily. Ideal Prostate    [provider]  Probiotic Product (PROBIOTIC 10 ULTRA STRENGTH PO) Take 1 capsule  by mouth daily.    [provider]  rosuvastatin (CRESTOR) 10 MG tablet Take 10 mg by mouth daily. 10/20/21   [provider]  Selenium 200 MCG CAPS Take 200 mcg by mouth daily.    [provider]  tirzepatide [facility]) 2.5 MG/0.5ML Pen Inject 2.5 mg into the skin once a week.    [provider]  tiZANidine (ZANAFLEX) 2 MG tablet Take 2 mg by mouth every 8 (eight) hours as needed for muscle spasms. 06/05/19   [provider]     Family History  Problem Relation Age of Onset   Diabetes Mother    Hypertension Mother    Hyperlipidemia Mother    Kidney disease Mother    Cancer Mother    Breast cancer Mother    Diabetes Brother    Breast cancer Sister    Breast cancer Sister    Colon cancer Neg Hx    Colon polyps Neg Hx    Esophageal cancer Neg Hx    Rectal cancer Neg Hx    Stomach cancer Neg Hx     Social History   Socioeconomic History   Marital status: Married    Spouse name: Diplomatic Services operational officer   Number of children: Not on file   Years of education: Not on file   Highest education level: Not on file  Occupational History   Occupation: Disabled  Tobacco Use   Smoking status: Never   Smokeless tobacco: Never  Vaping Use   Vaping Use: Never used  Substance and Sexual Activity   Alcohol use: No   Drug use: No   Sexual activity: Not on file  Other Topics Concern   Not  on file  Social History Narrative   Not on file   Social Determinants of Health   Financial Resource Strain: Not on file  Food Insecurity: Not on file  Transportation Needs: Not on file  Physical Activity: Not on file  Stress: Not on file  Social Connections: Not on file    Vital Signs: There were no vitals taken for this visit.  Physical Exam Deferred secondary to virtual visit.  Imaging:  CT AP, 02/02/22 Independently reviewed, showing enhancing, 3.4 cm L exophytic mid pole renal mass     Labs:  CBC: Recent Labs    02/17/22 0653 05/12/22 1242  WBC 9.3 6.7  HGB 15.1 13.9  HCT 45.4 44.5  PLT 194 199    COAGS: Recent Labs    02/17/22 0653  INR 1.0    BMP: Recent Labs    05/12/22 1242  NA 136  K 3.9  CL 103  CO2 23  GLUCOSE 408*  BUN 16  CALCIUM 8.5*  CREATININE 0.80  GFRNONAA >60    Pathology: SURGICAL PATHOLOGY CASE: [id number] PATIENT: Max [name] Surgical Pathology Report   Clinical History: indeterminate left renal lesion (cm)   FINAL MICROSCOPIC DIAGNOSIS:   A. KIDNEY, LEFT, NEEDLE CORE BIOPSY: - PAPILLARY RENAL CELL CARCINOMA, GRADE 2.   Assessment and Plan:  77 y.o. male comorbid including PMHx significant for prostate CA on surveillance, depression, obesity (BMI 32) and metabolic syndrome with T2DM. Last HbA1C 9.3 (05/12/22). Baseline sCr < 1.   Incidentally found 3 cm L renal mass, Bx proven papillary RCC Pt was followed by urology but declined for surgical resection given his poorly controlled A1C    He remains interested in pursuing a minimally-invasive option for the treatment of his renal mass at this time, with percutaneous ablation   *multiphase  Max (08/13/21), CT AP (02/02/22) and staging CT CAP (08/12/22) reviewed.  No additional imaging required. *I continued to stress the importance of sBG control for complication reduction, with A1c optimization. Ideally ~7 *comorbid, cardiac clearance completed,  08/12/22 *pre-anesthesia evaluation PENDING *Labs (CBC, CMP, Coags) and HbA1C ordered *Pt tentatively scheduled for LEFT RENAL MASS CRYOABLATION on 09/15/22 at [facility] Anticipate overnight admission post procedure, with potential early release if procedure is performed early and is uneventful   Electronically Signed:  Roanna Banning, MD [name]   Pager. [phone] Clinic. [phone]  I spent a total of 25 Minutes of non-face-to-face time in clinical consultation, greater than 50% of which was counseling/coordinating care for  Max Corderro Nissley Gullikson's renal cancer.

## 2022-08-27 ENCOUNTER — Other Ambulatory Visit: Payer: Self-pay | Admitting: Interventional Radiology

## 2022-08-27 DIAGNOSIS — N2889 Other specified disorders of kidney and ureter: Secondary | ICD-10-CM

## 2022-08-30 ENCOUNTER — Other Ambulatory Visit: Payer: Self-pay | Admitting: Radiology

## 2022-08-30 DIAGNOSIS — N2889 Other specified disorders of kidney and ureter: Secondary | ICD-10-CM

## 2022-08-30 NOTE — Progress Notes (Signed)
COVID Vaccine received:  []  No [x]  Yes Date of any COVID positive Test in last 90 days:  PCP - Deatra James, MD   At [facility]  [phone]  Cardiologist - Carolan Clines, MD   Clearance in 08-12-22  Note Endocrinology Talmage Coin, MD Urology: Meda Coffee, MD Pulmonary: Jetty Duhamel, MD  (LOV 07-26-2022)  Chest x-ray -  EKG - 08-12-2022  Stress Test -  ECHO -  Cardiac Cath -   PCR screen: []  Ordered & Completed           []   No Order but Needs PROFEND           [x]   N/A for this surgery  Surgery Plan:  []  Ambulatory                            [x]  Outpatient in bed                            []  Admit  Anesthesia:    [x]  General  []  Spinal                           []   Choice []   MAC  Bowel Prep - []  No  []   Yes ______  Pacemaker / ICD device [x]  No []  Yes   Spinal Cord Stimulator:[x]  No []  Yes       History of Sleep Apnea? []  No [x]  Yes   CPAP used?- []  No [x]  Yes    Does the patient monitor blood sugar?          []  No []  Yes  []  N/A  Patient has: []  NO Hx DM   []  Pre-DM                 []  DM1  [x]   DM2 Does patient have a [employer] or Dexacom? []  No []  Yes   Fasting Blood Sugar Ranges-  Checks Blood Sugar _____ times a day  GLP1 agonist / usual dose - Mounjaro ??? GLP1 instructions: Hold x 7 days SGLT-2 inhibitors / usual dose - Jardiance  ?? SGLT-2 instructions: Hold x 72 hours.  Other Diabetic medications/ instructions:   Blood Thinner / Instructions:   none Aspirin Instructions:None  ERAS Protocol Ordered: []  No  []  Yes PRE-SURGERY []  ENSURE  []  G2  []  No Drink Ordered  Patient is to be NPO after:   Comments: No Orders but called Radiology to get them placed   Can go up a flight of stairs and perform activities of daily living without stopping and without symptoms of chest pain or shortness of breath.   Anesthesia review: ADD, Bipolar, anxiety, uncontrolled DM2, Hx DVT, OSA-CPAP, asthma GERD,  HOH   Patient denies shortness of breath,  fever, cough and chest pain at PAT appointment.  Patient verbalized understanding and agreement to the Pre-Surgical Instructions that were given to them at this PAT appointment. Patient was also educated of the need to review these PAT instructions again prior to his surgery.I reviewed the appropriate phone numbers to call if they have any and questions or concerns.

## 2022-08-30 NOTE — Patient Instructions (Signed)
SURGICAL WAITING ROOM VISITATION Patients having surgery or a procedure may have no more than 2 support people in the waiting area - these visitors may rotate in the visitor waiting room.   Due to an increase in RSV and influenza rates and associated hospitalizations, children ages 93 and under may not visit patients in [facility] hospitals. If the patient needs to stay at the hospital during part of their recovery, the visitor guidelines for inpatient rooms apply.  PRE-OP VISITATION  Pre-op nurse will coordinate an appropriate time for 1 support person to accompany the patient in pre-op.  This support person may not rotate.  This visitor will be contacted when the time is appropriate for the visitor to come back in the pre-op area.  Please refer to the [facility] website for the visitor guidelines for Inpatients (after your surgery is over and you are in a regular room).  You are not required to quarantine at this time prior to your surgery. However, you must do this: Hand Hygiene often Do NOT share personal items Notify your provider if you are in close contact with someone who has COVID or you develop fever 100.4 or greater, new onset of sneezing, cough, sore throat, shortness of breath or body aches.  If you test positive for Covid or have been in contact with anyone that has tested positive in the last 10 days please notify you surgeon.    Your procedure is scheduled on:  Wednesday   September 15, 2022  Report to [facility] Main Entrance: [facility] Jacobsen entrance where the [employer] is available.   Report to admitting at:  06:30   AM  +++++Call this number if you have any questions or problems the morning of surgery [phone]  DO NOT EAT OR DRINK ANYTHING AFTER MIDNIGHT THE NIGHT PRIOR TO YOUR SURGERY / PROCEDURE.   FOLLOW BOWEL PREP AND ANY ADDITIONAL PRE OP INSTRUCTIONS YOU RECEIVED FROM YOUR SURGEON'S OFFICE!!!   Oral Hygiene is also important to reduce your risk of  infection.        Remember - BRUSH YOUR TEETH THE MORNING OF SURGERY WITH YOUR REGULAR TOOTHPASTE  Do NOT smoke after Midnight the night before surgery.  Take ONLY these medicines the morning of surgery with A SIP OF WATER: ?"???   If You have been diagnosed with Sleep Apnea - Bring CPAP mask and tubing day of surgery. We will provide you with a CPAP machine on the day of your surgery.                   You may not have any metal on your body including  jewelry, and body piercing  Do not wear  lotions, powders, cologne, or deodorant  Men may shave face and neck.  Contacts, Hearing Aids, dentures or bridgework may not be worn into surgery. DENTURES WILL BE REMOVED PRIOR TO SURGERY PLEASE DO NOT APPLY "Poly grip" OR ADHESIVES!!!  You may bring a small overnight bag with you on the day of surgery, only pack items that are not valuable. [facility] IS NOT RESPONSIBLE   FOR VALUABLES THAT ARE LOST OR STOLEN.   Do not bring your home medications to the hospital. The Pharmacy will dispense medications listed on your medication list to you during your admission in the Hospital.  Special Instructions: Bring a copy of your healthcare power of attorney and living will documents the day of surgery, if you wish to have them scanned into your [facility]  Medical Records- EPIC  Please read over the following fact sheets you were given: IF YOU HAVE QUESTIONS ABOUT YOUR PRE-OP INSTRUCTIONS, PLEASE CALL [phone].   [place] - Preparing for Surgery Before surgery, you can play an important role.  Because skin is not sterile, your skin needs to be as free of germs as possible.  You can reduce the number of germs on your skin by washing with CHG (chlorahexidine gluconate) soap before surgery.  CHG is an antiseptic cleaner which kills germs and bonds with the skin to continue killing germs even after washing. Please DO NOT use if you have an allergy to CHG or antibacterial soaps.  If your skin becomes  reddened/irritated stop using the CHG and inform your nurse when you arrive at Short Stay. Do not shave (including legs and underarms) for at least 48 hours prior to the first CHG shower.  You may shave your face/neck.  Please follow these instructions carefully:  1.  Shower with CHG Soap the night before surgery and the  morning of surgery.  2.  If you choose to wash your hair, wash your hair first as usual with your normal  shampoo.  3.  After you shampoo, rinse your hair and body thoroughly to remove the shampoo.                             4.  Use CHG as you would any other liquid soap.  You can apply chg directly to the skin and wash.  Gently with a scrungie or clean washcloth.  5.  Apply the CHG Soap to your body ONLY FROM THE NECK DOWN.   Do not use on face/ open                           Wound or open sores. Avoid contact with eyes, ears mouth and genitals (private parts).                       Wash face,  Genitals (private parts) with your normal soap.             6.  Wash thoroughly, paying special attention to the area where your  surgery  will be performed.  7.  Thoroughly rinse your body with warm water from the neck down.  8.  DO NOT shower/wash with your normal soap after using and rinsing off the CHG Soap.            9.  Pat yourself dry with a clean towel.            10.  Wear clean pajamas.            11.  Place clean sheets on your bed the night of your first shower and do not  sleep with pets.  ON THE DAY OF SURGERY : Do not apply any lotions/deodorants the morning of surgery.  Please wear clean clothes to the hospital/surgery center.    FAILURE TO FOLLOW THESE INSTRUCTIONS MAY RESULT IN THE CANCELLATION OF YOUR SURGERY  PATIENT SIGNATURE_________________________________  NURSE SIGNATURE__________________________________  ________________________________________________________________________

## 2022-08-31 ENCOUNTER — Encounter (HOSPITAL_COMMUNITY): Payer: Self-pay

## 2022-08-31 ENCOUNTER — Other Ambulatory Visit: Payer: Self-pay

## 2022-08-31 ENCOUNTER — Ambulatory Visit (HOSPITAL_COMMUNITY)
Admission: RE | Admit: 2022-08-31 | Discharge: 2022-08-31 | Disposition: A | Discharge status: Home / Self Care | Payer: Medicare Other | Source: Ambulatory Visit | Attending: Radiology | Admitting: Radiology

## 2022-08-31 ENCOUNTER — Encounter (HOSPITAL_COMMUNITY)
Admission: RE | Admit: 2022-08-31 | Discharge: 2022-08-31 | Disposition: A | Discharge status: Home / Self Care | Payer: Medicare Other | Source: Ambulatory Visit | Attending: Interventional Radiology | Admitting: Interventional Radiology

## 2022-08-31 VITALS — BP 126/77 | HR 80 | Temp 98.3°F | Resp 97 | Ht 71.0 in | Wt 217.0 lb

## 2022-08-31 DIAGNOSIS — E119 Type 2 diabetes mellitus without complications: Secondary | ICD-10-CM | POA: Insufficient documentation

## 2022-08-31 DIAGNOSIS — Z01818 Encounter for other preprocedural examination: Secondary | ICD-10-CM | POA: Insufficient documentation

## 2022-08-31 DIAGNOSIS — E1165 Type 2 diabetes mellitus with hyperglycemia: Secondary | ICD-10-CM

## 2022-08-31 DIAGNOSIS — N2889 Other specified disorders of kidney and ureter: Secondary | ICD-10-CM | POA: Insufficient documentation

## 2022-08-31 LAB — CBC WITH DIFFERENTIAL/PLATELET
Abs Immature Granulocytes: 0.03 10*3/uL (ref 0.00–0.07)
Basophils Absolute: 0 10*3/uL (ref 0.0–0.1)
Basophils Relative: 0 %
Eosinophils Absolute: 0 10*3/uL (ref 0.0–0.5)
Eosinophils Relative: 0 %
HCT: 48.2 % (ref 39.0–52.0)
Hemoglobin: 15.7 g/dL (ref 13.0–17.0)
Immature Granulocytes: 0 %
Lymphocytes Relative: 16 %
Lymphs Abs: 1.6 10*3/uL (ref 0.7–4.0)
MCH: 27.4 pg (ref 26.0–34.0)
MCHC: 32.6 g/dL (ref 30.0–36.0)
MCV: 84.3 fL (ref 80.0–100.0)
Monocytes Absolute: 0.8 10*3/uL (ref 0.1–1.0)
Monocytes Relative: 8 %
Neutro Abs: 7.6 10*3/uL (ref 1.7–7.7)
Neutrophils Relative %: 76 %
Platelets: 201 10*3/uL (ref 150–400)
RBC: 5.72 MIL/uL (ref 4.22–5.81)
RDW: 13.2 % (ref 11.5–15.5)
WBC: 10.1 10*3/uL (ref 4.0–10.5)
nRBC: 0 % (ref 0.0–0.2)

## 2022-08-31 LAB — PROTIME-INR
INR: 1 (ref 0.8–1.2)
Prothrombin Time: 13.6 seconds (ref 11.4–15.2)

## 2022-08-31 LAB — BASIC METABOLIC PANEL
Anion gap: 9 (ref 5–15)
BUN: 24 mg/dL — ABNORMAL HIGH (ref 8–23)
CO2: 22 mmol/L (ref 22–32)
Calcium: 8.7 mg/dL — ABNORMAL LOW (ref 8.9–10.3)
Chloride: 107 mmol/L (ref 98–111)
Creatinine, Ser: 0.73 mg/dL (ref 0.61–1.24)
GFR, Estimated: >60 mL/min (ref >60)
Glucose, Bld: 139 mg/dL — ABNORMAL HIGH (ref 70–99)
Potassium: 4.1 mmol/L (ref 3.5–5.1)
Sodium: 138 mmol/L (ref 135–145)

## 2022-09-01 LAB — COMPREHENSIVE METABOLIC PANEL
ALT: 26 U/L (ref 0–44)
AST: 21 U/L (ref 15–41)
Albumin: 4.2 g/dL (ref 3.5–5.0)
Alkaline Phosphatase: 70 U/L (ref 38–126)
Anion gap: 13 (ref 5–15)
BUN: 26 mg/dL — ABNORMAL HIGH (ref 8–23)
CO2: 19 mmol/L — ABNORMAL LOW (ref 22–32)
Calcium: 8.6 mg/dL — ABNORMAL LOW (ref 8.9–10.3)
Chloride: 108 mmol/L (ref 98–111)
Creatinine, Ser: 0.85 mg/dL (ref 0.61–1.24)
GFR, Estimated: >60 mL/min (ref >60)
Glucose, Bld: 141 mg/dL — ABNORMAL HIGH (ref 70–99)
Potassium: 4.2 mmol/L (ref 3.5–5.1)
Sodium: 140 mmol/L (ref 135–145)
Total Bilirubin: 0.3 mg/dL (ref 0.3–1.2)
Total Protein: 7.3 g/dL (ref 6.5–8.1)

## 2022-09-01 LAB — HEMOGLOBIN A1C
Hgb A1c MFr Bld: 8.2 % — ABNORMAL HIGH (ref 4.8–5.6)
Mean Plasma Glucose: 189 mg/dL

## 2022-09-03 ENCOUNTER — Telehealth: Payer: Self-pay | Admitting: Physician Assistant

## 2022-09-03 DIAGNOSIS — C649 Malignant neoplasm of unspecified kidney, except renal pelvis: Secondary | ICD-10-CM | POA: Diagnosis not present

## 2022-09-03 DIAGNOSIS — E1142 Type 2 diabetes mellitus with diabetic polyneuropathy: Secondary | ICD-10-CM | POA: Diagnosis not present

## 2022-09-03 DIAGNOSIS — E785 Hyperlipidemia, unspecified: Secondary | ICD-10-CM | POA: Diagnosis not present

## 2022-09-03 DIAGNOSIS — Z794 Long term (current) use of insulin: Secondary | ICD-10-CM | POA: Diagnosis not present

## 2022-09-03 DIAGNOSIS — I10 Essential (primary) hypertension: Secondary | ICD-10-CM | POA: Diagnosis not present

## 2022-09-03 NOTE — Progress Notes (Unsigned)
[facility] [phone]   [phone]  INITIAL CONSULTATION:  Patient Care Team: Deatra James, MD as PCP - General (Family Medicine) Wyline Mood Alben Spittle, MD as PCP - Cardiology (Cardiology)  CHIEF COMPLAINTS/PURPOSE OF CONSULTATION:  Abdominal lymphadenopathy  ONCOLOGIC HISTORY: 02/17/2022: Left kidney biopsy-papillary renal cell carcinoma, grade 2.  08/12/2022: CT CAP- Interval enlargement of small bowel mesenteric lymph nodes, largest measuring 3.5 x 2.3 cm, previously 3.1 x 1.7 cm. Unchanged prominent retroperitoneal lymph nodes. Unchanged exophytic lesion of the left kidney measuring 3.1 x 2.9 cm, concerning for small papillary renal cell carcinoma.  09/06/2022: Establish care at [facility]  HISTORY OF PRESENTING ILLNESS:  Ricci Barker 77 y.o. male with medical history significant for bipolar 1 disorder,  T2DM, hyperlipidemia, OSA, GERD, HTN, and DVT presents to the diagnostic clinic for abnormal CT scan concerning for abdominal lymphadenopathy. He is accompanied by his wife for this visit.   On exam today, Mr. Mosqueda reports that his energy levels are stable. He is able to complete his ADLs on his own. He has stable appetite without any significant weight changes. He does have chronic back pain that has been present for several years and wears a back brace. He denies nausea, vomiting or abdominal pain. His bowel habits are unchanged without recurrent episodes of diarrhea or constipation. He denies any fevers, chills, sweats, shortness of breath, chest pain or cough. He has no other complaints. Rest of the ROS is below.   MEDICAL HISTORY:  Past Medical History:  Diagnosis Date   ADD (attention deficit disorder)    Allergy    hayfever   Anxiety    Back pain    Bipolar 1 disorder (HCC)    Cataract    right eye-removed   Chronic pain    Constipation    Decreased hearing    Depression    Diabetes mellitus without complication  (HCC)    Type II   Dry skin    DVT (deep venous thrombosis) (HCC) 2004   leg (not sure which leg)   Dyslipidemia    Dyspnea    with exertion   Ear drainage    Excessive hunger    Excessive thirst    Eye pain    Fatigue    Frequent urination    GERD (gastroesophageal reflux disease)    Hard of hearing    History of kidney stones    Hyperlipidemia    Hypertension    Itching    Joint pain    Lactose intolerance    Leg cramps    Mouth sores    Nasal and sinus discharge    Nausea    Nausea with vomiting 09/27/2019   Numbness and tingling    bilateral   OSA (obstructive sleep apnea)    wears CPAP   Shortness of breath    Shortness of breath on exertion    Sinus congestion    Sleep apnea    uses cpap   Stiff neck    Stress    Swallowing difficulty    Swelling of extremity    Tinnitus    Trouble in sleeping    Urinary frequency    Vision changes    Weakness    Wears glasses     SURGICAL HISTORY: Past Surgical History:  Procedure Laterality Date   arm surgery Right    BACK SURGERY  2004   COLONOSCOPY     ESOPHAGOGASTRODUODENOSCOPY     EYE SURGERY  Right    cataract   IR RADIOLOGIST EVAL & MGMT  07/21/2022   IR RADIOLOGIST EVAL & MGMT  08/26/2022   SUBMANDIBULAR GLAND EXCISION Right 03/10/2016   Procedure: INTRAORAL  SUBMANDIBULAR  STONE REMOVAL;  Surgeon: Serena Colonel, MD;  Location: [facility] OR;  Service: ENT;  Laterality: Right;    SOCIAL HISTORY: Social History   Socioeconomic History   Marital status: Married    Spouse name: Diplomatic Services operational officer   Number of children: Not on file   Years of education: Not on file   Highest education level: Not on file  Occupational History   Occupation: Disabled  Tobacco Use   Smoking status: Never   Smokeless tobacco: Never  Vaping Use   Vaping Use: Never used  Substance and Sexual Activity   Alcohol use: No   Drug use: No   Sexual activity: Yes  Other Topics Concern   Not on file  Social History Narrative   Not on file    Social Determinants of Health   Financial Resource Strain: Not on file  Food Insecurity: Not on file  Transportation Needs: Not on file  Physical Activity: Not on file  Stress: Not on file  Social Connections: Not on file  Intimate Partner Violence: Not on file    FAMILY HISTORY: Family History  Problem Relation Age of Onset   Diabetes Mother    Hypertension Mother    Hyperlipidemia Mother    Kidney disease Mother    Cancer Mother    Breast cancer Mother    Diabetes Brother    Breast cancer Sister    Breast cancer Sister    Colon cancer Neg Hx    Colon polyps Neg Hx    Esophageal cancer Neg Hx    Rectal cancer Neg Hx    Stomach cancer Neg Hx     ALLERGIES:  is allergic to baclofen, butane, other, topiramate, blue dyes (parenteral), gabapentin, hydromorphone, ketorolac, poractant alfa, red dye, tizanidine, buprenorphine, pork-derived products, latex, and lisinopril.  MEDICATIONS:  Current Outpatient Medications  Medication Sig Dispense Refill   acetaminophen (TYLENOL) 500 MG tablet Take 500 mg by mouth every 6 (six) hours as needed for moderate pain.     amLODipine (NORVASC) 10 MG tablet Take 10 mg by mouth daily.      Bioflavonoid Products (BIOFLEX) TABS Take 1 tablet by mouth daily.     Coenzyme Q10 (COQ-10 PO) Take 1 capsule by mouth daily.     DULoxetine (CYMBALTA) 30 MG capsule Take 30 mg by mouth at bedtime.     finasteride (PROSCAR) 5 MG tablet Take 5 mg by mouth daily.     HYDROcodone-acetaminophen (NORCO) 7.5-325 MG tablet Take 1 tablet by mouth 4 (four) times daily as needed for moderate pain.     losartan (COZAAR) 100 MG tablet Take 100 mg by mouth daily.     Magnesium 400 MG TABS Take 400 mg by mouth daily.     Menthol, Topical Analgesic, (ABSORBINE PAIN RELIEVING EX) Apply 1 patch topically daily as needed (pain).     morphine (MS CONTIN) 15 MG 12 hr tablet Take 15 mg by mouth 2 (two) times daily as needed for pain.     OVER THE COUNTER MEDICATION Take 1  tablet by mouth daily. Focus Factor     OVER THE COUNTER MEDICATION Take 1 capsule by mouth daily. OMEGA XL     OVER THE COUNTER MEDICATION Take 1 capsule by mouth daily. Ideal Prostate  OVER THE COUNTER MEDICATION Take 1 capsule by mouth daily. Omega Plus Pro     rosuvastatin (CRESTOR) 10 MG tablet Take 10 mg by mouth every evening.     tirzepatide [facility]) 10 MG/0.5ML Pen Inject 10 mg into the skin every Thursday.     tiZANidine (ZANAFLEX) 2 MG tablet Take 2 mg by mouth every 8 (eight) hours as needed for muscle spasms.     TURMERIC PO Take 1,500 mg by mouth daily.     No current facility-administered medications for this visit.    REVIEW OF SYSTEMS:   Constitutional: ( - ) fevers, ( - )  chills , ( - ) night sweats Eyes: ( - ) blurriness of vision, ( - ) double vision, ( - ) watery eyes Ears, nose, mouth, throat, and face: ( - ) mucositis, ( - ) sore throat Respiratory: ( - ) cough, ( - ) dyspnea, ( - ) wheezes Cardiovascular: ( - ) palpitation, ( - ) chest discomfort, ( - ) lower extremity swelling Gastrointestinal:  ( - ) nausea, ( - ) heartburn, ( - ) change in bowel habits Skin: ( - ) abnormal skin rashes Lymphatics: ( - ) new lymphadenopathy, ( - ) easy bruising Neurological: ( - ) numbness, ( - ) tingling, ( - ) new weaknesses Behavioral/Psych: ( - ) mood change, ( - ) new changes  All other systems were reviewed with the patient and are negative.  PHYSICAL EXAMINATION: ECOG PERFORMANCE STATUS: 1 - Symptomatic but completely ambulatory  Vitals:   09/06/22 0952  BP: (!) 159/80  Pulse: 79  Resp: 16  Temp: 98.1 F (36.7 C)  SpO2: 94%   Filed Weights   09/06/22 0952  Weight: 221 lb 1.6 oz (100.3 kg)    GENERAL: well appearing male in NAD  SKIN: skin color, texture, turgor are normal, no rashes or significant lesions EYES: conjunctiva are pink and non-injected, sclera clear OROPHARYNX: no exudate, no erythema; lips, buccal mucosa, and tongue normal  NECK:  supple, non-tender LYMPH:  no palpable lymphadenopathy in the cervical, axillary or supraclavicular lymph nodes.  LUNGS: clear to auscultation and percussion with normal breathing effort HEART: regular rate & rhythm and no murmurs and no lower extremity edema ABDOMEN: soft, non-tender, non-distended, normal bowel sounds Musculoskeletal: no cyanosis of digits and no clubbing  PSYCH: alert & oriented x 3, fluent speech NEURO: no focal motor/sensory deficits  LABORATORY DATA:  I have reviewed the data as listed    Latest Ref Rng & Units 09/06/2022   10:27 AM 08/31/2022   10:10 AM 05/12/2022   12:42 PM  CBC  WBC 4.0 - 10.5 K/uL 6.9  10.1  6.7   Hemoglobin 13.0 - 17.0 g/dL 16.1  09.6  04.5   Hematocrit 39.0 - 52.0 % 45.9  48.2  44.5   Platelets 150 - 400 K/uL 195  201  199        Latest Ref Rng & Units 09/06/2022   10:27 AM 08/31/2022   10:10 AM 08/31/2022   10:00 AM  CMP  Glucose 70 - 99 mg/dL 409  811  914   BUN 8 - 23 mg/dL 20  24  26    Creatinine 0.61 - 1.24 mg/dL 7.82  9.56  2.13   Sodium 135 - 145 mmol/L 139  138  140   Potassium 3.5 - 5.1 mmol/L 3.8  4.1  4.2   Chloride 98 - 111 mmol/L 107  107  108   CO2  22 - 32 mmol/L 24  22  19    Calcium 8.9 - 10.3 mg/dL 9.0  8.7  8.6   Total Protein 6.5 - 8.1 g/dL 7.3   7.3   Total Bilirubin 0.3 - 1.2 mg/dL 0.4   0.3   Alkaline Phos 38 - 126 U/L 73   70   AST 15 - 41 U/L 17   21   ALT 0 - 44 U/L 21   26      RADIOGRAPHIC STUDIES: I have personally reviewed the radiological images as listed and agreed with the findings in the report. DG Chest 1 View  Result Date: 09/05/2022 CLINICAL DATA:  [id number] Preop examination [id number] EXAM: CHEST  1 VIEW COMPARISON:  December 22, 2011, Aug 12, 2022 FINDINGS: The cardiomediastinal silhouette is unchanged in contour. No pleural effusion. No pneumothorax. No acute pleuroparenchymal abnormality. IMPRESSION: No acute cardiopulmonary abnormality. Electronically Signed   By: Meda Klinefelter M.D.   On:  09/05/2022 11:49   IR Radiologist Eval & Mgmt  Result Date: 08/26/2022 EXAM: ESTABLISHED PATIENT VISIT CHIEF COMPLAINT: See below HISTORY OF PRESENT ILLNESS: See below REVIEW OF SYSTEMS: See below PHYSICAL EXAMINATION: See below ASSESSMENT AND PLAN: Please refer to completed note in the electronic medical record on [facility] Roanna Banning, MD [facility] Electronically Signed   By: Roanna Banning M.D.   On: 08/26/2022 08:28    ASSESSMENT & PLAN KENAAN RUETER is a 77 y.o. male who presents to the clinic for evaluation for abdominal lymphadenopathy. We reviewed possible etiologies including infectious process, inflammatory process or underlying malignancy. We discussed underlying diagnostic workup including laboratory evaluation today followed PET/CT scan to identify if enlarged lymph nodes are FDG avid and to identify other target lesions. Based on PET scan results, we will request a core needle biopsy to confirm tissue diagnosis. Mr. Reichmann expressed understanding of the plan provided and has agreed to move forward with the workup.   #Abdominal lymphadenopathy: --Seen on CT imaging from 08/12/2022.  --Labs today to check CBC, CMP, LDH, flow cytometry, ESR, CRP, CEA, PSA and chromogranin levels  --Will obtain PET/CT scan to further evaluate enlarged lymph nodes and identify other target lesions --Will request a core needle biopsy of one of the enlarged lymph nodes.  --RTC once workup is complete.  # Left papillary renal cell carcinoma: --Confirmed by biopsy on 02/17/2022 --Scheduled to undergo CT guided tissue ablation on 09/15/2022.    Orders Placed This Encounter  Procedures   CT OUTSIDE FILMS BODY/ABD/PELVIS    Standing Status:   Future    Number of Occurrences:   1    Standing Expiration Date:   09/06/2023    Order Specific Question:   Where was this CD imported?    Answer:   [facility]   CBC with Differential  (Cancer Center Only)    Standing Status:   Future    Number of Occurrences:   1    Standing Expiration Date:   09/06/2023   CMP (Cancer Center only)    Standing Status:   Future    Number of Occurrences:   1    Standing Expiration Date:   09/06/2023   [facility] (Access)-[facility] ONLY    Standing Status:   Future    Number of Occurrences:   1    Standing Expiration Date:   09/06/2023   Chromogranin A    Standing Status:   Future    Number of  Occurrences:   1    Standing Expiration Date:   09/06/2023   Flow Cytometry, Peripheral Blood (Oncology)    Standing Status:   Future    Number of Occurrences:   1    Standing Expiration Date:   09/06/2023   Lactate dehydrogenase (LDH)    Standing Status:   Future    Number of Occurrences:   1    Standing Expiration Date:   09/06/2023   Sedimentation rate    Standing Status:   Future    Number of Occurrences:   1    Standing Expiration Date:   09/06/2023   C-reactive protein    Standing Status:   Future    Number of Occurrences:   1    Standing Expiration Date:   09/06/2023   Prostate-Specific AG, Serum    All questions were answered. The patient knows to call the clinic with any problems, questions or concerns.  I have spent a total of 60 minutes minutes of face-to-face and non-face-to-face time, preparing to see the patient, obtaining and/or reviewing separately obtained history, performing a medically appropriate examination, counseling and educating the patient, ordering tests/procedures, documenting clinical information in the electronic health record, independently  and care coordination.   Georga Kaufmann, PA-C Department of Hematology/Oncology [facility] at [facility] Phone: [phone]   Patient was seen with Dr. Leonides Schanz  I have read the above note and personally examined the patient. I agree with the assessment and plan as noted above.  Briefly Mr. Treyquan Lambertson is a 77 year old male who presents for evaluation of  intra-abdominal lymphadenopathy.  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  Pending the results of the chromogranin study will determine if   Ulysees Barns, MD Department of Hematology/Oncology [facility] at [facility] Phone: [phone] Pager: [phone] Email: [email or web address]

## 2022-09-03 NOTE — Telephone Encounter (Signed)
scheduled per referral, pt has been called and confirmed date and time. Pt is aware of location and to arrive early for check in   

## 2022-09-06 ENCOUNTER — Inpatient Hospital Stay
Admission: RE | Admit: 2022-09-06 | Discharge: 2022-09-06 | Disposition: A | Discharge status: Home / Self Care | Payer: Self-pay | Source: Ambulatory Visit | Attending: Physician Assistant | Admitting: Physician Assistant

## 2022-09-06 ENCOUNTER — Inpatient Hospital Stay: Payer: Medicare Other

## 2022-09-06 ENCOUNTER — Inpatient Hospital Stay: Payer: Medicare Other | Attending: Physician Assistant | Admitting: Physician Assistant

## 2022-09-06 ENCOUNTER — Other Ambulatory Visit: Payer: Self-pay

## 2022-09-06 VITALS — BP 159/80 | HR 79 | Temp 98.1°F | Resp 16 | Wt 221.1 lb

## 2022-09-06 DIAGNOSIS — M549 Dorsalgia, unspecified: Secondary | ICD-10-CM | POA: Insufficient documentation

## 2022-09-06 DIAGNOSIS — R590 Localized enlarged lymph nodes: Secondary | ICD-10-CM

## 2022-09-06 DIAGNOSIS — Z803 Family history of malignant neoplasm of breast: Secondary | ICD-10-CM | POA: Diagnosis not present

## 2022-09-06 DIAGNOSIS — Z859 Personal history of malignant neoplasm, unspecified: Secondary | ICD-10-CM | POA: Diagnosis not present

## 2022-09-06 DIAGNOSIS — C642 Malignant neoplasm of left kidney, except renal pelvis: Secondary | ICD-10-CM | POA: Insufficient documentation

## 2022-09-06 DIAGNOSIS — R935 Abnormal findings on diagnostic imaging of other abdominal regions, including retroperitoneum: Secondary | ICD-10-CM | POA: Insufficient documentation

## 2022-09-06 DIAGNOSIS — G8929 Other chronic pain: Secondary | ICD-10-CM | POA: Insufficient documentation

## 2022-09-06 LAB — CMP (CANCER CENTER ONLY)
ALT: 21 U/L (ref 0–44)
AST: 17 U/L (ref 15–41)
Albumin: 4.5 g/dL (ref 3.5–5.0)
Alkaline Phosphatase: 73 U/L (ref 38–126)
Anion gap: 8 (ref 5–15)
BUN: 20 mg/dL (ref 8–23)
CO2: 24 mmol/L (ref 22–32)
Calcium: 9 mg/dL (ref 8.9–10.3)
Chloride: 107 mmol/L (ref 98–111)
Creatinine: 0.8 mg/dL (ref 0.61–1.24)
GFR, Estimated: >60 mL/min (ref >60)
Glucose, Bld: 188 mg/dL — ABNORMAL HIGH (ref 70–99)
Potassium: 3.8 mmol/L (ref 3.5–5.1)
Sodium: 139 mmol/L (ref 135–145)
Total Bilirubin: 0.4 mg/dL (ref 0.3–1.2)
Total Protein: 7.3 g/dL (ref 6.5–8.1)

## 2022-09-06 LAB — CBC WITH DIFFERENTIAL (CANCER CENTER ONLY)
Abs Immature Granulocytes: 0.01 10*3/uL (ref 0.00–0.07)
Basophils Absolute: 0 10*3/uL (ref 0.0–0.1)
Basophils Relative: 1 %
Eosinophils Absolute: 0.1 10*3/uL (ref 0.0–0.5)
Eosinophils Relative: 1 %
HCT: 45.9 % (ref 39.0–52.0)
Hemoglobin: 15.6 g/dL (ref 13.0–17.0)
Immature Granulocytes: 0 %
Lymphocytes Relative: 22 %
Lymphs Abs: 1.5 10*3/uL (ref 0.7–4.0)
MCH: 27.6 pg (ref 26.0–34.0)
MCHC: 34 g/dL (ref 30.0–36.0)
MCV: 81.1 fL (ref 80.0–100.0)
Monocytes Absolute: 0.5 10*3/uL (ref 0.1–1.0)
Monocytes Relative: 8 %
Neutro Abs: 4.7 10*3/uL (ref 1.7–7.7)
Neutrophils Relative %: 68 %
Platelet Count: 195 10*3/uL (ref 150–400)
RBC: 5.66 MIL/uL (ref 4.22–5.81)
RDW: 13.2 % (ref 11.5–15.5)
WBC Count: 6.9 10*3/uL (ref 4.0–10.5)
nRBC: 0 % (ref 0.0–0.2)

## 2022-09-06 LAB — C-REACTIVE PROTEIN: CRP: 0.5 mg/dL (ref <1.0)

## 2022-09-06 LAB — SEDIMENTATION RATE: Sed Rate: 3 mm/hr (ref 0–16)

## 2022-09-06 LAB — CEA (ACCESS): CEA (CHCC): 1.85 ng/mL (ref 0.00–5.00)

## 2022-09-06 LAB — LACTATE DEHYDROGENASE: LDH: 165 U/L (ref 98–192)

## 2022-09-07 DIAGNOSIS — E119 Type 2 diabetes mellitus without complications: Secondary | ICD-10-CM | POA: Diagnosis not present

## 2022-09-07 DIAGNOSIS — Z794 Long term (current) use of insulin: Secondary | ICD-10-CM | POA: Diagnosis not present

## 2022-09-08 LAB — PROSTATE-SPECIFIC AG, SERUM (LABCORP): Prostate Specific Ag, Serum: 3.4 ng/mL (ref 0.0–4.0)

## 2022-09-08 LAB — SURGICAL PATHOLOGY

## 2022-09-09 LAB — CHROMOGRANIN A: Chromogranin A (ng/mL): 77.9 ng/mL (ref 0.0–101.8)

## 2022-09-09 LAB — FLOW CYTOMETRY

## 2022-09-10 ENCOUNTER — Encounter: Payer: Self-pay | Admitting: Physician Assistant

## 2022-09-14 ENCOUNTER — Other Ambulatory Visit: Payer: Self-pay | Admitting: Radiology

## 2022-09-14 DIAGNOSIS — C642 Malignant neoplasm of left kidney, except renal pelvis: Secondary | ICD-10-CM | POA: Diagnosis not present

## 2022-09-14 NOTE — H&P (Signed)
Referring Physician(s): Winter,C  Supervising Physician: Marliss Coots  Patient Status:  [facility] OP TBA  Chief Complaint:  Left renal cell carcinoma  Subjective: Patient familiar to IR service from left renal mass biopsy on 02/17/2022 and consultation with Dr. Milford Cage on 07/21/2022 to discuss treatment options for left renal cell carcinoma.  He is a 77 year old male with past medical history significant for ADD, anxiety, bipolar 1 disorder, metabolic syndrome with poorly controlled diabetes, lower extremity DVT, hyperlipidemia, GERD, hearing difficulty, nephrolithiasis, hypertension, sleep apnea, prostate CA on surveillance, obesity and above-noted left papillary renal cell carcinoma.  Patient was in discussion for surgical resection of the left renal cell carcinoma however due to his diabetes and poorly controlled A1c he was referred for minimally invasive consultation for potential ablation.  Patient also has history of abdominal lymphadenopathy noted on CT from May of this year.  PET scan pending. following discussions with Dr. Milford Cage patient was deemed an appropriate candidate for image guided cryoablation of the left renal cell carcinoma and presents today for the procedure.    Past Medical History:  Diagnosis Date   ADD (attention deficit disorder)    Allergy    hayfever   Anxiety    Back pain    Bipolar 1 disorder (HCC)    Cataract    right eye-removed   Chronic pain    Constipation    Decreased hearing    Depression    Diabetes mellitus without complication (HCC)    Type II   Dry skin    DVT (deep venous thrombosis) (HCC) 2004   leg (not sure which leg)   Dyslipidemia    Dyspnea    with exertion   Ear drainage    Excessive hunger    Excessive thirst    Eye pain    Fatigue    Frequent urination    GERD (gastroesophageal reflux disease)    Hard of hearing    History of kidney stones    Hyperlipidemia    Hypertension    Itching    Joint pain    Lactose  intolerance    Leg cramps    Mouth sores    Nasal and sinus discharge    Nausea    Nausea with vomiting 09/27/2019   Numbness and tingling    bilateral   OSA (obstructive sleep apnea)    wears CPAP   Shortness of breath    Shortness of breath on exertion    Sinus congestion    Sleep apnea    uses cpap   Stiff neck    Stress    Swallowing difficulty    Swelling of extremity    Tinnitus    Trouble in sleeping    Urinary frequency    Vision changes    Weakness    Wears glasses    Past Surgical History:  Procedure Laterality Date   arm surgery Right    BACK SURGERY  2004   COLONOSCOPY     ESOPHAGOGASTRODUODENOSCOPY     EYE SURGERY Right    cataract   IR RADIOLOGIST EVAL & MGMT  07/21/2022   IR RADIOLOGIST EVAL & MGMT  08/26/2022   SUBMANDIBULAR GLAND EXCISION Right 03/10/2016   Procedure: INTRAORAL  SUBMANDIBULAR  STONE REMOVAL;  Surgeon: Serena Colonel, MD;  Location: [facility] OR;  Service: ENT;  Laterality: Right;      Allergies: Baclofen, Butane, Other, Topiramate, Blue dyes (parenteral), Gabapentin, Hydromorphone, Ketorolac, Poractant alfa, Red dye, Tizanidine, Buprenorphine, Pork-derived products, Latex,  and Lisinopril  Medications: Prior to Admission medications   Medication Sig Start Date End Date Taking? Authorizing Provider  acetaminophen (TYLENOL) 500 MG tablet Take 500 mg by mouth every 6 (six) hours as needed for moderate pain.   Yes [provider]  amLODipine (NORVASC) 10 MG tablet Take 10 mg by mouth daily.  06/29/12  Yes [provider]  Bioflavonoid Products (BIOFLEX) TABS Take 1 tablet by mouth daily.   Yes [provider]  Coenzyme Q10 (COQ-10 PO) Take 1 capsule by mouth daily.   Yes [provider]  DULoxetine (CYMBALTA) 30 MG capsule Take 30 mg by mouth at bedtime.   Yes [provider]  finasteride (PROSCAR) 5 MG tablet Take 5 mg by mouth daily.   Yes [provider]  HYDROcodone-acetaminophen (NORCO)  7.5-325 MG tablet Take 1 tablet by mouth 4 (four) times daily as needed for moderate pain. 12/08/21  Yes [provider]  losartan (COZAAR) 100 MG tablet Take 100 mg by mouth daily.   Yes [provider]  Magnesium 400 MG TABS Take 400 mg by mouth daily.   Yes [provider]  Menthol, Topical Analgesic, (ABSORBINE PAIN RELIEVING EX) Apply 1 patch topically daily as needed (pain).   Yes [provider]  morphine (MS CONTIN) 15 MG 12 hr tablet Take 15 mg by mouth 2 (two) times daily as needed for pain.   Yes [provider]  OVER THE COUNTER MEDICATION Take 1 tablet by mouth daily. Focus Factor   Yes [provider]  OVER THE COUNTER MEDICATION Take 1 capsule by mouth daily. OMEGA XL   Yes [provider]  OVER THE COUNTER MEDICATION Take 1 capsule by mouth daily. Ideal Prostate   Yes [provider]  OVER THE COUNTER MEDICATION Take 1 capsule by mouth daily. Omega Plus Pro   Yes [provider]  tirzepatide Greggory Keen) 10 MG/0.5ML Pen Inject 10 mg into the skin every Thursday.   Yes [provider]  tiZANidine (ZANAFLEX) 2 MG tablet Take 2 mg by mouth every 8 (eight) hours as needed for muscle spasms. 06/05/19  Yes [provider]  TURMERIC PO Take 1,500 mg by mouth daily.   Yes [provider]  rosuvastatin (CRESTOR) 10 MG tablet Take 10 mg by mouth every evening. 10/20/21   [provider]     Vital Signs:     Physical Exam  Imaging: No results found.  Labs:  CBC: Recent Labs    02/17/22 0653 05/12/22 1242 08/31/22 1010 09/06/22 1027  WBC 9.3 6.7 10.1 6.9  HGB 15.1 13.9 15.7 15.6  HCT 45.4 44.5 48.2 45.9  PLT 194 199 201 195    COAGS: Recent Labs    02/17/22 0653 08/31/22 1010  INR 1.0 1.0    BMP: Recent Labs    05/12/22 1242 08/31/22 1000 08/31/22 1010 09/06/22 1027  NA 136 140 138 139  K 3.9 4.2 4.1 3.8  CL 103 108 107 107  CO2 23 19* 22 24   GLUCOSE 408* 141* 139* 188*  BUN 16 26* 24* 20  CALCIUM 8.5* 8.6* 8.7* 9.0  CREATININE 0.80 0.85 0.73 0.80  GFRNONAA >60 >60 >60 >60    LIVER FUNCTION TESTS: Recent Labs    08/31/22 1000 09/06/22 1027  BILITOT 0.3 0.4  AST 21 17  ALT 26 21  ALKPHOS 70 73  PROT 7.3 7.3  ALBUMIN 4.2 4.5    Assessment and Plan: 77 year old male with past  medical history significant for ADD, anxiety, bipolar 1 disorder, metabolic syndrome with poorly controlled diabetes, lower extremity DVT, hyperlipidemia, GERD, hearing difficulty, nephrolithiasis, hypertension, sleep apnea, prostate CA on surveillance, obesity and left papillary renal cell carcinoma (diagnosed Nov 2023).  Patient was in discussion for surgical resection of the left renal cell carcinoma however due to his diabetes and poorly controlled A1c he was referred for minimally invasive consultation for potential ablation.  Patient also has history of abdominal lymphadenopathy noted on CT from May of this year.  PET scan pending. Following recent discussions with Dr. Milford Cage patient was deemed an appropriate candidate for image guided cryoablation of the left renal cell carcinoma and presents today for the procedure.  Details/risks of procedure, including but not limited to, internal bleeding, infection, injury to adjacent structures, anesthesia related complications discussed with the patient/spouse with their understanding and consent.  This procedure involves the use of CT and because of the nature of the planned procedure, it is possible that we will have prolonged use of CT  Potential radiation risks to you include (but are not limited to) the following: - A slightly elevated risk for cancer  several years later in life. This risk is typically less than 0.5% percent. This risk is low in comparison to the normal incidence of human cancer, which is 33% for women and 50% for men according to the [employer]. - Radiation induced  injury can include skin redness, resembling a rash, tissue breakdown / ulcers and hair loss (which can be temporary or permanent).   The likelihood of either of these occurring depends on the difficulty of the procedure and whether you are sensitive to radiation due to previous procedures, disease, or genetic conditions.   IF your procedure requires a prolonged use of radiation, you will be notified and given written instructions for further action.  It is your responsibility to monitor the irradiated area for the 2 weeks following the procedure and to notify your physician if you are concerned that you have suffered a radiation induced injury.      Electronically Signed: D. Jeananne Rama, PA-C 09/14/2022, 5:12 PM   I spent a total of 30 minutes at the the patient's bedside AND on the patient's hospital floor or unit, greater than 50% of which was counseling/coordinating care for image guided cryoablation of left renal cell carcinoma

## 2022-09-14 NOTE — Anesthesia Preprocedure Evaluation (Signed)
Anesthesia Evaluation  Patient identified by MRN, date of birth, ID band Patient awake    Reviewed: Allergy & Precautions, NPO status , Patient's Chart, lab work & pertinent test results  Airway Mallampati: II  TM Distance: >3 FB Neck ROM: Full    Dental  (+) Edentulous Upper, Edentulous Lower, Dental Advisory Given   Pulmonary shortness of breath, asthma , sleep apnea    Pulmonary exam normal breath sounds clear to auscultation       Cardiovascular hypertension,  Rhythm:Regular Rate:Normal     Neuro/Psych  PSYCHIATRIC DISORDERS Anxiety Depression Bipolar Disorder      GI/Hepatic ,GERD  Controlled,,  Endo/Other  diabetes, Type 2    Renal/GU Renal diseaseL Renal mass Lab Results      Component                Value               Date                      CREATININE               0.80                05/12/2022                BUN                      16                  05/12/2022                NA                       136                 05/12/2022                K                        3.9                 05/12/2022                    Musculoskeletal Hx of chronic pain   Abdominal  (+) + obese  Peds  Hematology Lab Results      Component                Value               Date                      WBC                      6.7                 05/12/2022                HGB                      13.9                05/12/2022                HCT  44.5                05/12/2022                    PLT                      199                 05/12/2022              Anesthesia Other Findings ALL: See List  Reproductive/Obstetrics                             Anesthesia Physical Anesthesia Plan  ASA: 3  Anesthesia Plan: General   Post-op Pain Management: Ofirmev IV (intra-op)*   Induction: Intravenous, Rapid sequence and Cricoid pressure planned  PONV Risk Score and  Plan: 2 and Treatment may vary due to age or medical condition, Ondansetron and Dexamethasone  Airway Management Planned: Oral ETT  Additional Equipment: None  Intra-op Plan:   Post-operative Plan: Extubation in OR  Informed Consent: I have reviewed the patients History and Physical, chart, labs and discussed the procedure including the risks, benefits and alternatives for the proposed anesthesia with the patient or authorized representative who has indicated his/her understanding and acceptance.     Dental advisory given  Plan Discussed with: CRNA  Anesthesia Plan Comments:        Anesthesia Quick Evaluation

## 2022-09-15 ENCOUNTER — Observation Stay (HOSPITAL_COMMUNITY)
Admission: RE | Admit: 2022-09-15 | Discharge: 2022-09-16 | Disposition: A | Discharge status: Home / Self Care | Payer: Medicare Other | Source: Ambulatory Visit | Attending: Interventional Radiology | Admitting: Interventional Radiology

## 2022-09-15 ENCOUNTER — Encounter (HOSPITAL_COMMUNITY): Payer: Self-pay | Admitting: Interventional Radiology

## 2022-09-15 ENCOUNTER — Observation Stay (HOSPITAL_COMMUNITY)
Admission: RE | Admit: 2022-09-15 | Discharge: 2022-09-15 | Disposition: A | Discharge status: Home / Self Care | Payer: Medicare Other | Source: Ambulatory Visit | Attending: Interventional Radiology | Admitting: Interventional Radiology

## 2022-09-15 ENCOUNTER — Encounter (HOSPITAL_COMMUNITY): Payer: Self-pay

## 2022-09-15 ENCOUNTER — Other Ambulatory Visit: Payer: Self-pay

## 2022-09-15 ENCOUNTER — Ambulatory Visit (HOSPITAL_BASED_OUTPATIENT_CLINIC_OR_DEPARTMENT_OTHER): Payer: Medicare Other | Admitting: Anesthesiology

## 2022-09-15 ENCOUNTER — Ambulatory Visit (HOSPITAL_COMMUNITY): Payer: Medicare Other | Admitting: Anesthesiology

## 2022-09-15 ENCOUNTER — Encounter (HOSPITAL_COMMUNITY)
Admission: RE | Disposition: A | Discharge status: Home / Self Care | Payer: Self-pay | Source: Ambulatory Visit | Attending: Interventional Radiology

## 2022-09-15 DIAGNOSIS — C652 Malignant neoplasm of left renal pelvis: Principal | ICD-10-CM | POA: Insufficient documentation

## 2022-09-15 DIAGNOSIS — G4730 Sleep apnea, unspecified: Secondary | ICD-10-CM | POA: Diagnosis not present

## 2022-09-15 DIAGNOSIS — E119 Type 2 diabetes mellitus without complications: Secondary | ICD-10-CM | POA: Insufficient documentation

## 2022-09-15 DIAGNOSIS — N2889 Other specified disorders of kidney and ureter: Secondary | ICD-10-CM

## 2022-09-15 DIAGNOSIS — Z79899 Other long term (current) drug therapy: Secondary | ICD-10-CM | POA: Diagnosis not present

## 2022-09-15 DIAGNOSIS — Z86718 Personal history of other venous thrombosis and embolism: Secondary | ICD-10-CM | POA: Diagnosis not present

## 2022-09-15 DIAGNOSIS — I10 Essential (primary) hypertension: Secondary | ICD-10-CM

## 2022-09-15 DIAGNOSIS — C642 Malignant neoplasm of left kidney, except renal pelvis: Secondary | ICD-10-CM | POA: Diagnosis not present

## 2022-09-15 DIAGNOSIS — Z794 Long term (current) use of insulin: Secondary | ICD-10-CM

## 2022-09-15 DIAGNOSIS — E1165 Type 2 diabetes mellitus with hyperglycemia: Secondary | ICD-10-CM

## 2022-09-15 HISTORY — PX: RADIOLOGY WITH ANESTHESIA: SHX6223

## 2022-09-15 LAB — GLUCOSE, CAPILLARY
Glucose-Capillary: 116 mg/dL — ABNORMAL HIGH (ref 70–99)
Glucose-Capillary: 154 mg/dL — ABNORMAL HIGH (ref 70–99)
Glucose-Capillary: 178 mg/dL — ABNORMAL HIGH (ref 70–99)

## 2022-09-15 SURGERY — IR WITH ANESTHESIA
Anesthesia: General

## 2022-09-15 MED ORDER — OXYCODONE HCL 5 MG PO TABS
10.0000 mg | ORAL_TABLET | Freq: Four times a day (QID) | ORAL | Status: DC | PRN
  Administered 2022-09-15 – 2022-09-16 (×2): 10 mg via ORAL
  Filled 2022-09-15 (×2): qty 2

## 2022-09-15 MED ORDER — PHENYLEPHRINE HCL (PRESSORS) 10 MG/ML IV SOLN
INTRAVENOUS | Status: DC | PRN
  Administered 2022-09-15 (×2): 80 ug via INTRAVENOUS

## 2022-09-15 MED ORDER — MAGNESIUM OXIDE -MG SUPPLEMENT 400 (240 MG) MG PO TABS
400.0000 mg | ORAL_TABLET | Freq: Every day | ORAL | Status: DC
  Administered 2022-09-15 – 2022-09-16 (×2): 400 mg via ORAL
  Filled 2022-09-15 (×2): qty 1

## 2022-09-15 MED ORDER — DIPHENHYDRAMINE HCL 50 MG/ML IJ SOLN
INTRAMUSCULAR | Status: AC
  Filled 2022-09-15: qty 1

## 2022-09-15 MED ORDER — CHLORHEXIDINE GLUCONATE 0.12 % MT SOLN: 15.0000 mL | Freq: Once | OROMUCOSAL | Status: DC

## 2022-09-15 MED ORDER — MIDAZOLAM HCL 2 MG/2ML IJ SOLN
INTRAMUSCULAR | Status: AC
  Filled 2022-09-15: qty 2

## 2022-09-15 MED ORDER — SODIUM CHLORIDE 0.9 % IV SOLN
INTRAVENOUS | Status: AC
  Filled 2022-09-15: qty 250

## 2022-09-15 MED ORDER — PROPOFOL 10 MG/ML IV BOLUS
INTRAVENOUS | Status: DC | PRN
  Administered 2022-09-15 (×2): 50 mg via INTRAVENOUS
  Administered 2022-09-15: 150 mg via INTRAVENOUS

## 2022-09-15 MED ORDER — OXYCODONE HCL 5 MG PO TABS: 10.0000 mg | ORAL_TABLET | Freq: Four times a day (QID) | ORAL | Status: DC | PRN

## 2022-09-15 MED ORDER — ONDANSETRON HCL 4 MG/2ML IJ SOLN: 4.0000 mg | Freq: Four times a day (QID) | INTRAMUSCULAR | Status: DC | PRN

## 2022-09-15 MED ORDER — FENTANYL CITRATE (PF) 250 MCG/5ML IJ SOLN
INTRAMUSCULAR | Status: AC
  Filled 2022-09-15: qty 5

## 2022-09-15 MED ORDER — SODIUM CHLORIDE 0.9 % IV SOLN: INTRAVENOUS | Status: DC

## 2022-09-15 MED ORDER — MIDAZOLAM HCL 5 MG/5ML IJ SOLN
INTRAMUSCULAR | Status: DC | PRN
  Administered 2022-09-15: 2 mg via INTRAVENOUS

## 2022-09-15 MED ORDER — INSULIN ASPART 100 UNIT/ML IJ SOLN: 0.0000 [IU] | INTRAMUSCULAR | Status: DC | PRN

## 2022-09-15 MED ORDER — ACETAMINOPHEN 500 MG PO TABS: 500.0000 mg | ORAL_TABLET | Freq: Four times a day (QID) | ORAL | Status: DC | PRN

## 2022-09-15 MED ORDER — LOSARTAN POTASSIUM 50 MG PO TABS
100.0000 mg | ORAL_TABLET | Freq: Every day | ORAL | Status: DC
  Administered 2022-09-15 – 2022-09-16 (×2): 100 mg via ORAL
  Filled 2022-09-15 (×2): qty 2

## 2022-09-15 MED ORDER — SODIUM CHLORIDE 0.9 % IV SOLN
INTRAVENOUS | Status: AC
  Filled 2022-09-15: qty 20

## 2022-09-15 MED ORDER — ROCURONIUM BROMIDE 100 MG/10ML IV SOLN
INTRAVENOUS | Status: DC | PRN
  Administered 2022-09-15: 30 mg via INTRAVENOUS
  Administered 2022-09-15: 70 mg via INTRAVENOUS
  Administered 2022-09-15: 50 mg via INTRAVENOUS

## 2022-09-15 MED ORDER — IOHEXOL 350 MG/ML SOLN
100.0000 mL | Freq: Once | INTRAVENOUS | Status: AC | PRN
  Administered 2022-09-15: 100 mL via INTRAVENOUS

## 2022-09-15 MED ORDER — SENNOSIDES-DOCUSATE SODIUM 8.6-50 MG PO TABS: 1.0000 | ORAL_TABLET | Freq: Every day | ORAL | Status: DC | PRN

## 2022-09-15 MED ORDER — SODIUM CHLORIDE 0.9 % IV SOLN
2.0000 g | Freq: Once | INTRAVENOUS | Status: AC
  Administered 2022-09-15: 2 g via INTRAVENOUS

## 2022-09-15 MED ORDER — BIOFLEX PO TABS: 1.0000 | ORAL_TABLET | Freq: Every day | ORAL | Status: DC

## 2022-09-15 MED ORDER — FENTANYL CITRATE PF 50 MCG/ML IJ SOSY: 25.0000 ug | PREFILLED_SYRINGE | INTRAMUSCULAR | Status: DC | PRN

## 2022-09-15 MED ORDER — LACTATED RINGERS IV SOLN: INTRAVENOUS | Status: DC

## 2022-09-15 MED ORDER — SODIUM CHLORIDE (PF) 0.9 % IJ SOLN
INTRAMUSCULAR | Status: AC
  Filled 2022-09-15: qty 50

## 2022-09-15 MED ORDER — BUPIVACAINE HCL (PF) 0.5 % IJ SOLN
INTRAMUSCULAR | Status: AC
  Filled 2022-09-15: qty 30

## 2022-09-15 MED ORDER — MORPHINE SULFATE (PF) 4 MG/ML IV SOLN: 2.0000 mg | INTRAVENOUS | Status: DC | PRN

## 2022-09-15 MED ORDER — INSULIN ASPART 100 UNIT/ML IJ SOLN
4.0000 [IU] | Freq: Three times a day (TID) | INTRAMUSCULAR | Status: DC
  Administered 2022-09-15 – 2022-09-16 (×2): 4 [IU] via SUBCUTANEOUS

## 2022-09-15 MED ORDER — DEXAMETHASONE SODIUM PHOSPHATE 10 MG/ML IJ SOLN
INTRAMUSCULAR | Status: DC | PRN
  Administered 2022-09-15: 10 mg via INTRAVENOUS

## 2022-09-15 MED ORDER — AMLODIPINE BESYLATE 10 MG PO TABS
10.0000 mg | ORAL_TABLET | Freq: Every day | ORAL | Status: DC
  Administered 2022-09-15 – 2022-09-16 (×2): 10 mg via ORAL
  Filled 2022-09-15 (×2): qty 1

## 2022-09-15 MED ORDER — LIDOCAINE HCL (CARDIAC) PF 100 MG/5ML IV SOSY
PREFILLED_SYRINGE | INTRAVENOUS | Status: DC | PRN
  Administered 2022-09-15: 100 mg via INTRAVENOUS

## 2022-09-15 MED ORDER — PROMETHAZINE HCL 25 MG/ML IJ SOLN: 6.2500 mg | INTRAMUSCULAR | Status: DC | PRN

## 2022-09-15 MED ORDER — TIRZEPATIDE 10 MG/0.5ML ~~LOC~~ SOAJ: 10.0000 mg | SUBCUTANEOUS | Status: DC

## 2022-09-15 MED ORDER — SUGAMMADEX SODIUM 500 MG/5ML IV SOLN
INTRAVENOUS | Status: DC | PRN
  Administered 2022-09-15: 400 mg via INTRAVENOUS

## 2022-09-15 MED ORDER — INSULIN ASPART 100 UNIT/ML IJ SOLN
0.0000 [IU] | Freq: Three times a day (TID) | INTRAMUSCULAR | Status: DC
  Administered 2022-09-15 – 2022-09-16 (×2): 3 [IU] via SUBCUTANEOUS

## 2022-09-15 MED ORDER — DULOXETINE HCL 30 MG PO CPEP
30.0000 mg | ORAL_CAPSULE | Freq: Every day | ORAL | Status: DC
  Administered 2022-09-15: 30 mg via ORAL
  Filled 2022-09-15: qty 1

## 2022-09-15 MED ORDER — ONDANSETRON HCL 4 MG/2ML IJ SOLN
INTRAMUSCULAR | Status: DC | PRN
  Administered 2022-09-15: 4 mg via INTRAVENOUS

## 2022-09-15 MED ORDER — DIPHENHYDRAMINE HCL 50 MG/ML IJ SOLN
INTRAMUSCULAR | Status: DC | PRN
  Administered 2022-09-15: 50 mg via INTRAVENOUS

## 2022-09-15 MED ORDER — FINASTERIDE 5 MG PO TABS
5.0000 mg | ORAL_TABLET | Freq: Every day | ORAL | Status: DC
  Administered 2022-09-15 – 2022-09-16 (×2): 5 mg via ORAL
  Filled 2022-09-15 (×2): qty 1

## 2022-09-15 MED ORDER — OXYCODONE-ACETAMINOPHEN 5-325 MG PO TABS
1.0000 | ORAL_TABLET | ORAL | Status: AC
  Administered 2022-09-15: 1 via ORAL
  Filled 2022-09-15: qty 1

## 2022-09-15 MED ORDER — ORAL CARE MOUTH RINSE
15.0000 mL | Freq: Once | OROMUCOSAL | Status: AC
  Administered 2022-09-15: 15 mL via OROMUCOSAL

## 2022-09-15 MED ORDER — FENTANYL CITRATE (PF) 100 MCG/2ML IJ SOLN
INTRAMUSCULAR | Status: DC | PRN
  Administered 2022-09-15 (×2): 100 ug via INTRAVENOUS
  Administered 2022-09-15: 50 ug via INTRAVENOUS

## 2022-09-15 MED ORDER — SENNA 8.6 MG PO TABS
1.0000 | ORAL_TABLET | Freq: Every day | ORAL | Status: DC
  Filled 2022-09-15: qty 1

## 2022-09-15 NOTE — Transfer of Care (Signed)
Immediate Anesthesia Transfer of Care Note  Patient: Max [name]  Procedure(s) Performed: CT CRYO ABLATION  Patient Location: PACU  Anesthesia Type:General  Level of Consciousness: awake, drowsy, and patient cooperative  Airway & Oxygen Therapy: Patient Spontanous Breathing and Patient connected to nasal cannula oxygen  Post-op Assessment: Report given to RN, Post -op Vital signs reviewed and stable, and Patient moving all extremities X 4  Post vital signs: Reviewed and stable  Last Vitals:  Vitals Value Taken Time  BP 161/76 09/15/22 1320  Temp    Pulse 72 09/15/22 1322  Resp 0 09/15/22 1322  SpO2 95 % 09/15/22 1322  Vitals shown include unvalidated device data.  Last Pain:  Vitals:   09/15/22 0700  PainSc: 0-No pain         Complications: No notable events documented.

## 2022-09-15 NOTE — Sedation Documentation (Signed)
Patient with Latex allergy - 16 Fr latex Foley inserted prior to Left Renal Mass cryoablation - Dr. Milford Cage made aware and ordered Benadryl 50 mg IV to be given.

## 2022-09-15 NOTE — Anesthesia Procedure Notes (Signed)
Procedure Name: Intubation Date/Time: 09/15/2022 8:59 AM  Performed by: Shanon Payor, CRNAPre-anesthesia Checklist: Patient identified, Emergency Drugs available, Suction available, Patient being monitored and Timeout performed Patient Re-evaluated:Patient Re-evaluated prior to induction Oxygen Delivery Method: Circle system utilized Preoxygenation: Pre-oxygenation with 100% oxygen Induction Type: IV induction Ventilation: Mask ventilation without difficulty Laryngoscope Size: Mac and 4 Grade View: Grade I Tube type: Oral Tube size: 7.5 mm Number of attempts: 1 Airway Equipment and Method: Stylet Placement Confirmation: ETT inserted through vocal cords under direct vision, positive ETCO2, CO2 detector and breath sounds checked- equal and bilateral Secured at: 23 cm Tube secured with: Tape Dental Injury: Teeth and Oropharynx as per pre-operative assessment

## 2022-09-15 NOTE — Progress Notes (Signed)
   09/15/22 2300  BiPAP/CPAP/SIPAP  BiPAP/CPAP/SIPAP Pt Type Adult  BiPAP/CPAP/SIPAP Resmed (home unit)  Mask Type Nasal pillows  Respiratory Rate 16 breaths/min  FiO2 (%) 21 %  Heater Temperature  (sterile water added to humidifier)  Patient Home Equipment Yes  Auto Titrate Yes (8-20cm h2o)  Safety Check Completed by RT for Home Unit Yes, no issues noted  BiPAP/CPAP /SiPAP Vitals  Pulse Rate 91  Resp 16  SpO2 93 %

## 2022-09-15 NOTE — Procedures (Signed)
Vascular and Interventional Radiology Procedure Note  Patient: Max [name] DOB: 09-03-1945 Medical Record Number: [id number] Note Date/Time: 09/15/22 9:37 AM   Performing Physician: Roanna Banning, MD Assistant(s): None  Diagnosis: L renal mass. Bx proven G2 papillary RCC (02/17/22)   Procedure:  PERCUTANEOUS CRYOABLATION of LEFT RENAL CARCINOMA   Anesthesia: General Anesthesia Complications: None Estimated Blood Loss: Minimal Specimens: Sent for Pathology     Findings:  Successful CT-guided cryoablation using 3x IceForce 2.1 CX probes. Contrasted post-ablation CT with adequate lesional coverage.  Hemostasis of the tract was achieved using Manual Pressure.   Plan: Bed rest for 2 hours.  See detailed procedure note with images in PACS. The patient tolerated the procedure well without incident or complication and was returned to PACU in stable condition.    Roanna Banning, MD [facility]   Pager. [phone] Clinic. [phone]

## 2022-09-15 NOTE — Progress Notes (Addendum)
Patient ID: Max [name], male   DOB: January 27, 1946, 77 y.o.   MRN: [id number] Patient status post left renal cell carcinoma cryoablation earlier today; has only minimal left flank discomfort.  Afebrile, BP 159/91, heart rate 77, O2 sats 100% room air; puncture site left flank clean and dry, minimal tenderness to palpation, patient has voided yellow urine.  Plan for overnight observation, advance diet as tolerated; follow-up with Dr. Milford Cage in IR clinic in 3 to 4 weeks.

## 2022-09-16 ENCOUNTER — Encounter (HOSPITAL_COMMUNITY): Payer: Self-pay | Admitting: Interventional Radiology

## 2022-09-16 ENCOUNTER — Ambulatory Visit: Admission: RE | Admit: 2022-09-16 | Payer: Medicare Other | Source: Ambulatory Visit

## 2022-09-16 DIAGNOSIS — E119 Type 2 diabetes mellitus without complications: Secondary | ICD-10-CM | POA: Diagnosis not present

## 2022-09-16 DIAGNOSIS — C652 Malignant neoplasm of left renal pelvis: Secondary | ICD-10-CM | POA: Diagnosis not present

## 2022-09-16 DIAGNOSIS — I10 Essential (primary) hypertension: Secondary | ICD-10-CM | POA: Diagnosis not present

## 2022-09-16 DIAGNOSIS — Z86718 Personal history of other venous thrombosis and embolism: Secondary | ICD-10-CM | POA: Diagnosis not present

## 2022-09-16 DIAGNOSIS — Z79899 Other long term (current) drug therapy: Secondary | ICD-10-CM | POA: Diagnosis not present

## 2022-09-16 LAB — GLUCOSE, CAPILLARY: Glucose-Capillary: 156 mg/dL — ABNORMAL HIGH (ref 70–99)

## 2022-09-16 NOTE — Progress Notes (Signed)
Patient, wife and daughter provided with discharge education, Patient and wife verbalized understanding. IV's removed.

## 2022-09-16 NOTE — Anesthesia Postprocedure Evaluation (Signed)
Anesthesia Post Note  Patient: Max [name]  Procedure(s) Performed: CT CRYO ABLATION     Patient location during evaluation: PACU Anesthesia Type: General Level of consciousness: sedated and patient cooperative Pain management: pain level controlled Vital Signs Assessment: post-procedure vital signs reviewed and stable Respiratory status: spontaneous breathing Cardiovascular status: stable Anesthetic complications: no   No notable events documented.  Last Vitals:  Vitals:   09/16/22 0213 09/16/22 0602  BP: (!) 155/90 (!) 144/82  Pulse: 78 74  Resp: 18 16  Temp: 36.9 C 36.8 C  SpO2: 97% 97%    Last Pain:  Vitals:   09/16/22 0942  TempSrc:   PainSc: 0-No pain                 Lewie Loron

## 2022-09-16 NOTE — Discharge Summary (Signed)
Patient ID: GRANDERSON FLADGER MRN: [id number] DOB/AGE: [birth year] 77 y.o.  Admit date: 09/15/2022 Discharge date: 09/16/2022  Supervising Physician: Simonne Come  Patient Status: [facility] - In-pt  Admission Diagnoses: renal mass, left  Discharge Diagnoses:  Principal Problem:   Renal cancer, left [facility]) Active Problems:   Renal cell cancer, left [facility])   Discharged Condition: good  Hospital Course:   Patient presented to [facility] IR for an image-guided left renal mass cryoablation with Dr. Milford Cage on 09/15/22. Procedure occurred without major complications and patient was transferred to floor in stable condition (VSS, left flank puncture site stable) for overnight observation. No major events occurred overnight.   Patient awake and alert, son at bedside. He has no complaints at this time. Foley catheter removed  without difficulty. Patient denies left flank pain or hematuria. Left flank puncture site stable. Plan to discharge home today and follow-up with Dr. Milford Cage for televisit 3-4 weeks after discharge.   Consults: None  Significant Diagnostic Studies: none   Treatments: Image guided cryoablation   Discharge Exam: Blood pressure (!) 144/82, pulse 74, temperature 98.2 F (36.8 C), temperature source Oral, resp. rate 16, height 5\' 11"  (1.803 m), weight 221 lb 1.6 oz (100.3 kg), SpO2 97 %.  Physical Exam Vitals and nursing note reviewed.  Constitutional:      General: Patient is not in acute distress.    Appearance: Normal appearance. Patient is not ill-appearing.  HENT:     Head: Normocephalic and atraumatic.      Cardiovascular:     Rate and Rhythm: Normal rate and regular rhythm.     Pulses: Normal pulses.    Pulmonary:     Effort: Pulmonary effort is normal.   Abdominal:     General: Abdomen is flat. Bowel sounds are normal.     Palpations: Abdomen is soft.  Musculoskeletal:     Cervical back: Neck supple.  Skin:    General: Skin is warm and dry.     Coloration:  Skin is not jaundiced or pale.  Positive dressing on left flank puncture site. Site is unremarkable with no erythema, edema, tenderness, bleeding or drainage. Minimal amount of old, dry blood noted on the dressing. Dressing otherwise clean, dry, and intact.   Neurological:     Mental Status: Patient is alert and oriented to person, place, and time.  Psychiatric:        Mood and Affect: Mood normal.        Behavior: Behavior normal.        Judgment: Judgment normal.    Disposition: Discharge disposition: 01-Home or Self Care       Discharge Instructions     Call MD for:   Complete by: As directed    Call MD for:  difficulty breathing, headache or visual disturbances   Complete by: As directed    Call MD for:  extreme fatigue   Complete by: As directed    Call MD for:  hives   Complete by: As directed    Call MD for:  persistant dizziness or light-headedness   Complete by: As directed    Call MD for:  persistant nausea and vomiting   Complete by: As directed    Call MD for:  redness, tenderness, or signs of infection (pain, swelling, redness, odor or green/yellow discharge around incision site)   Complete by: As directed    Call MD for:  severe uncontrolled pain   Complete by: As directed    Call MD  for:  temperature >100.4   Complete by: As directed    Diet - low sodium heart healthy   Complete by: As directed    Discharge wound care:   Complete by: As directed    May place a Band-aid to left flank puncture site until fully heals. Keep the left flank punctures site clean and dry, do not submerge for 7 days. Can take shower.   Increase activity slowly   Complete by: As directed    Lifting restrictions   Complete by: As directed    No lifting, stooping, bending more than 10 pounds for 1 week.      Allergies as of 09/16/2022       Reactions   Baclofen Swelling   Swelling in neck , sometimes throat   Butane Anxiety, Other (See Comments)   Hallucinations REACTIONS  ARE SIDE EFFECTS    Other Itching, Other (See Comments)   Causes itching-sensitivity Glucocorticoid   Topiramate Other (See Comments)   Hallucinations    Blue Dyes (parenteral) Other (See Comments)   unknown   Gabapentin Other (See Comments)   Causes agression   Hydromorphone Swelling   SWELLING REACTION UNSPECIFIED    Ketorolac Other (See Comments)   Side pain   Poractant Alfa Other (See Comments)   Pt. Refuses pork derived products   Red Dye Other (See Comments)   unknown   Tizanidine Swelling   SWELLING REACTION UNSPECIFIED    Buprenorphine Other (See Comments)   UNSPECIFIED REACTION   Pork-derived Products    Pt. Refuses pork derived products   Latex Rash   Localized rash   Lisinopril Cough        Medication List     TAKE these medications    ABSORBINE PAIN RELIEVING EX Apply 1 patch topically daily as needed (pain).   acetaminophen 500 MG tablet Commonly known as: TYLENOL Take 500 mg by mouth every 6 (six) hours as needed for moderate pain.   amLODipine 10 MG tablet Commonly known as: NORVASC Take 10 mg by mouth daily.   Bioflex Tabs Take 1 tablet by mouth daily.   COQ-10 PO Take 1 capsule by mouth daily.   DULoxetine 30 MG capsule Commonly known as: CYMBALTA Take 30 mg by mouth at bedtime.   finasteride 5 MG tablet Commonly known as: PROSCAR Take 5 mg by mouth daily.   HYDROcodone-acetaminophen 7.5-325 MG tablet Commonly known as: NORCO Take 1 tablet by mouth 4 (four) times daily as needed for moderate pain.   losartan 100 MG tablet Commonly known as: COZAAR Take 100 mg by mouth daily.   Magnesium 400 MG Tabs Take 400 mg by mouth daily.   morphine 15 MG 12 hr tablet Commonly known as: MS CONTIN Take 15 mg by mouth 2 (two) times daily as needed for pain.   Mounjaro 10 MG/0.5ML Pen Generic drug: tirzepatide Inject 10 mg into the skin every Thursday.   OVER THE COUNTER MEDICATION Take 1 tablet by mouth daily. Focus Factor   OVER  THE COUNTER MEDICATION Take 1 capsule by mouth daily. OMEGA XL   OVER THE COUNTER MEDICATION Take 1 capsule by mouth daily. Ideal Prostate   OVER THE COUNTER MEDICATION Take 1 capsule by mouth daily. Omega Plus Pro   rosuvastatin 10 MG tablet Commonly known as: CRESTOR Take 10 mg by mouth every evening.   tiZANidine 2 MG tablet Commonly known as: ZANAFLEX Take 2 mg by mouth every 8 (eight) hours as needed for muscle spasms.  TURMERIC PO Take 1,500 mg by mouth daily.               Discharge Care Instructions  (From admission, onward)           Start     Ordered   09/16/22 0000  Discharge wound care:       Comments: May place a Band-aid to left flank puncture site until fully heals. Keep the left flank punctures site clean and dry, do not submerge for 7 days. Can take shower.   09/16/22 1610            Follow-up Information     Mugweru, Cletis Athens, MD Follow up.   Specialties: Interventional Radiology, Diagnostic Radiology, Radiology Why: Follow up visit in 3-4 weeks, our schedulers will call you to set up the appointment. If you have any questions, please call [phone]. Contact information: [address] [place] [address] [phone]                  Electronically Signed: Willette Brace, PA-C 09/16/2022, 9:08 AM   I have spent Less Than 30 Minutes discharging Max [name].

## 2022-09-16 NOTE — Discharge Instructions (Signed)
Cryoablation of Renal Tumors, Care After  This sheet gives you information about how to care for yourself after your procedure. Your health care provider may also give you more specific instructions. If you have problems or questions, contact your health care provider.  What can I expect after the procedure? After your procedure, it is common to have pain and discomfort in the upper abdomen. You may be given prescription pain medicines to control this if the pain is severe.   Follow these instructions at home: Puncture site care Follow instructions from your health care provider about how to take care of your incision. Make sure you:  - Wash your hands with soap and water before you change your bandage (dressing). If soap and water are not available, use hand sanitizer.  - Ok to shower 24  hours following procedure. Recommend showering with bandage on, remove bandage immediately after showering and pat area dry. No further dressing changes needed after this- ensure area remains clean and dry until fully healed.  - No submerging (swimming, bathing) for 7 days post-procedure. Check your puncture site area every day for signs of infection. Check for: - Redness, swelling, or pain. - Fluid or blood. - Warmth. - Pus or a bad smell. Activity Rest as often as needing during the first few days of recovery. No stooping, bending, or lifting more than 10 pounds for 1 week.  General instructions To prevent or treat constipation while you are taking prescription pain medicine, your health care provider may recommend that you: - Drink enough fluid to keep your urine clear or pale yellow. - Take over-the-counter or prescription medicines. - Eat foods that are high in fiber, such as fresh fruits and vegetables, whole grains, and beans. - Limit foods that are high in fat and processed sugars, such as fried and sweet foods. - Do not use any products that contain nicotine or tobacco, such as cigarettes and  e-cigarettes. If you need help quitting, ask your health care provider. - Keep all follow-up visits as told by your health care provider. This is important.  3-4 week televisit with the doctor who performed the procedure. Our office will call you to set up the appointment.   Contact a health care provider if: You cannot pass gas. You are unable to have a bowel movement within 3 days. You have a skin rash.  Get help right away if: You have a fever. You have severe or lasting pain in your abdomen, shoulder, or back. You have trouble swallowing or breathing. You have severe weakness or dizziness. You have chest pain or shortness of breath.   This information is not intended to replace advice given to you by your health care provider. Make sure you discuss any questions you have with your health care provider.

## 2022-09-16 NOTE — Progress Notes (Signed)
   09/16/22 1009  TOC Brief Assessment  Insurance and Status Reviewed  Patient has primary care physician Yes  Home environment has been reviewed Resides with spouse  Prior level of function: Independent at baseline  Prior/Current Home Services No current home services  Social Determinants of Health Reivew SDOH reviewed no interventions necessary  Readmission risk has been reviewed Yes  Transition of care needs no transition of care needs at this time

## 2022-09-20 ENCOUNTER — Other Ambulatory Visit: Payer: Medicare Other

## 2022-09-21 ENCOUNTER — Ambulatory Visit
Admission: RE | Admit: 2022-09-21 | Discharge: 2022-09-21 | Disposition: A | Discharge status: Home / Self Care | Payer: Medicare Other | Source: Ambulatory Visit | Attending: Physician Assistant | Admitting: Physician Assistant

## 2022-09-21 DIAGNOSIS — I2510 Atherosclerotic heart disease of native coronary artery without angina pectoris: Secondary | ICD-10-CM | POA: Diagnosis not present

## 2022-09-21 DIAGNOSIS — E279 Disorder of adrenal gland, unspecified: Secondary | ICD-10-CM | POA: Diagnosis not present

## 2022-09-21 DIAGNOSIS — N200 Calculus of kidney: Secondary | ICD-10-CM | POA: Insufficient documentation

## 2022-09-21 DIAGNOSIS — F32A Depression, unspecified: Secondary | ICD-10-CM | POA: Diagnosis not present

## 2022-09-21 DIAGNOSIS — N2889 Other specified disorders of kidney and ureter: Secondary | ICD-10-CM | POA: Insufficient documentation

## 2022-09-21 DIAGNOSIS — C642 Malignant neoplasm of left kidney, except renal pelvis: Secondary | ICD-10-CM

## 2022-09-21 DIAGNOSIS — M47816 Spondylosis without myelopathy or radiculopathy, lumbar region: Secondary | ICD-10-CM | POA: Diagnosis not present

## 2022-09-21 DIAGNOSIS — I700 Atherosclerosis of aorta: Secondary | ICD-10-CM | POA: Diagnosis not present

## 2022-09-21 DIAGNOSIS — I517 Cardiomegaly: Secondary | ICD-10-CM | POA: Diagnosis not present

## 2022-09-21 DIAGNOSIS — G894 Chronic pain syndrome: Secondary | ICD-10-CM | POA: Diagnosis not present

## 2022-09-21 DIAGNOSIS — R590 Localized enlarged lymph nodes: Secondary | ICD-10-CM

## 2022-09-21 MED ORDER — FLUDEOXYGLUCOSE F - 18 (FDG) INJECTION
11.5000 | Freq: Once | INTRAVENOUS | Status: AC | PRN
  Administered 2022-09-21: 12.57 via INTRAVENOUS

## 2022-09-22 LAB — GLUCOSE, CAPILLARY: Glucose-Capillary: 98 mg/dL (ref 70–99)

## 2022-09-23 ENCOUNTER — Telehealth: Payer: Self-pay | Admitting: Physician Assistant

## 2022-09-23 DIAGNOSIS — R590 Localized enlarged lymph nodes: Secondary | ICD-10-CM

## 2022-09-23 NOTE — Telephone Encounter (Signed)
I called Ms. Max [name] to review the PET scan results from 09/21/2022. Findings show hypermetabolic abdominal retroperitoneal and mesenteric lymph nodes. We recommend a core needle biopsy to evaluate for malignancy. Patient expressed understanding of the plan. We will follow up once biopsy is reviewed.

## 2022-09-24 ENCOUNTER — Other Ambulatory Visit (HOSPITAL_COMMUNITY): Payer: Medicare Other

## 2022-09-24 NOTE — Progress Notes (Signed)
Oley Balm, MD  Leodis Rains D PROCEDURE / BIOPSY REVIEW Date: 09/24/22  Requested Biopsy site: mesenteric LAN Reason for request: r/o lymphoma Imaging review: Best seen on CT 09/21/22 Im 100 Se 4  Decision: Approved Imaging modality to perform: CT Schedule with: Moderate Sedation Schedule for: Any VIR  Additional comments: @VIR :  minimal window, may be worse with peristalsis, worth a look under CT again if we can get to it safely  but may need laparoscopic bx  Please contact me with questions, concerns, or if issue pertaining to this request arise.  Dayne Oley Balm, MD [facility]

## 2022-10-04 DIAGNOSIS — C642 Malignant neoplasm of left kidney, except renal pelvis: Secondary | ICD-10-CM | POA: Diagnosis not present

## 2022-10-07 DIAGNOSIS — Z794 Long term (current) use of insulin: Secondary | ICD-10-CM | POA: Diagnosis not present

## 2022-10-07 DIAGNOSIS — E119 Type 2 diabetes mellitus without complications: Secondary | ICD-10-CM | POA: Diagnosis not present

## 2022-10-13 ENCOUNTER — Other Ambulatory Visit: Payer: Self-pay | Admitting: Student

## 2022-10-13 DIAGNOSIS — R590 Localized enlarged lymph nodes: Secondary | ICD-10-CM

## 2022-10-13 DIAGNOSIS — Z01812 Encounter for preprocedural laboratory examination: Secondary | ICD-10-CM

## 2022-10-14 ENCOUNTER — Ambulatory Visit (HOSPITAL_COMMUNITY)
Admission: RE | Admit: 2022-10-14 | Discharge: 2022-10-14 | Disposition: A | Discharge status: Home / Self Care | Payer: Medicare Other | Source: Ambulatory Visit | Attending: Physician Assistant | Admitting: Physician Assistant

## 2022-10-14 ENCOUNTER — Other Ambulatory Visit: Payer: Self-pay

## 2022-10-14 DIAGNOSIS — Z8553 Personal history of malignant neoplasm of renal pelvis: Secondary | ICD-10-CM | POA: Diagnosis not present

## 2022-10-14 DIAGNOSIS — M549 Dorsalgia, unspecified: Secondary | ICD-10-CM | POA: Diagnosis not present

## 2022-10-14 DIAGNOSIS — R591 Generalized enlarged lymph nodes: Secondary | ICD-10-CM | POA: Diagnosis not present

## 2022-10-14 DIAGNOSIS — K219 Gastro-esophageal reflux disease without esophagitis: Secondary | ICD-10-CM | POA: Diagnosis not present

## 2022-10-14 DIAGNOSIS — F32A Depression, unspecified: Secondary | ICD-10-CM | POA: Insufficient documentation

## 2022-10-14 DIAGNOSIS — G4733 Obstructive sleep apnea (adult) (pediatric): Secondary | ICD-10-CM | POA: Insufficient documentation

## 2022-10-14 DIAGNOSIS — F319 Bipolar disorder, unspecified: Secondary | ICD-10-CM | POA: Diagnosis not present

## 2022-10-14 DIAGNOSIS — Z01812 Encounter for preprocedural laboratory examination: Secondary | ICD-10-CM

## 2022-10-14 DIAGNOSIS — F419 Anxiety disorder, unspecified: Secondary | ICD-10-CM | POA: Diagnosis not present

## 2022-10-14 DIAGNOSIS — E119 Type 2 diabetes mellitus without complications: Secondary | ICD-10-CM | POA: Insufficient documentation

## 2022-10-14 DIAGNOSIS — R590 Localized enlarged lymph nodes: Secondary | ICD-10-CM | POA: Insufficient documentation

## 2022-10-14 DIAGNOSIS — I10 Essential (primary) hypertension: Secondary | ICD-10-CM | POA: Diagnosis not present

## 2022-10-14 LAB — CBC
HCT: 44.2 % (ref 39.0–52.0)
Hemoglobin: 14.5 g/dL (ref 13.0–17.0)
MCH: 27 pg (ref 26.0–34.0)
MCHC: 32.8 g/dL (ref 30.0–36.0)
MCV: 82.2 fL (ref 80.0–100.0)
Platelets: 218 10*3/uL (ref 150–400)
RBC: 5.38 MIL/uL (ref 4.22–5.81)
RDW: 13.7 % (ref 11.5–15.5)
WBC: 8.7 10*3/uL (ref 4.0–10.5)
nRBC: 0 % (ref 0.0–0.2)

## 2022-10-14 LAB — PROTIME-INR
INR: 1.1 (ref 0.8–1.2)
Prothrombin Time: 14.3 seconds (ref 11.4–15.2)

## 2022-10-14 LAB — GLUCOSE, CAPILLARY
Glucose-Capillary: 172 mg/dL — ABNORMAL HIGH (ref 70–99)
Glucose-Capillary: 140 mg/dL — ABNORMAL HIGH (ref 70–99)

## 2022-10-14 MED ORDER — FENTANYL CITRATE (PF) 100 MCG/2ML IJ SOLN
INTRAMUSCULAR | Status: AC
  Filled 2022-10-14: qty 2

## 2022-10-14 MED ORDER — MIDAZOLAM HCL 2 MG/2ML IJ SOLN
INTRAMUSCULAR | Status: AC | PRN
  Administered 2022-10-14 (×2): 1 mg via INTRAVENOUS

## 2022-10-14 MED ORDER — SODIUM CHLORIDE 0.9 % IV SOLN: INTRAVENOUS | Status: DC

## 2022-10-14 MED ORDER — LORAZEPAM 2 MG/ML IJ SOLN: INTRAMUSCULAR | Status: AC | PRN

## 2022-10-14 MED ORDER — FENTANYL CITRATE (PF) 100 MCG/2ML IJ SOLN
INTRAMUSCULAR | Status: AC | PRN
  Administered 2022-10-14 (×4): 25 ug via INTRAVENOUS

## 2022-10-14 MED ORDER — MIDAZOLAM HCL 2 MG/2ML IJ SOLN
INTRAMUSCULAR | Status: AC
  Filled 2022-10-14: qty 2

## 2022-10-14 MED ORDER — HYDROCODONE-ACETAMINOPHEN 5-325 MG PO TABS: 1.0000 | ORAL_TABLET | ORAL | Status: DC | PRN

## 2022-10-14 MED ORDER — LIDOCAINE HCL (PF) 1 % IJ SOLN
20.0000 mL | Freq: Once | INTRAMUSCULAR | Status: AC
  Administered 2022-10-14: 20 mL

## 2022-10-14 NOTE — Procedures (Signed)
Interventional Radiology Procedure:   Indications: Enlarged abdominal lymph nodes, concern for lymphoproliferative process.   Procedure: CT guided left retroperitoneal lymph node biopsy  Findings: No percutaneous window to biopsy mesenteric nodes.  Core biopsies obtained from small left retroperitoneal node.  Complications: None     EBL: Minimal  Plan: Bedrest 2 hours, then discharge to home.   Shonta Bourque R. Lowella Dandy, MD  Pager: [phone]

## 2022-10-14 NOTE — H&P (Signed)
Chief Complaint: Patient was seen in consultation today for mesenteric lymphadenopathy  Referring Physician(s): Briant Cedar  Supervising Physician: Richarda Overlie  Patient Status: [facility] - Out-pt  History of Present Illness: Max [name] is a 77 y.o. male with complex PMH including anxiety, back pain, bipolar 1 disorder, depression, type II diabetes mellitus, GERD, hypertension, obstructive sleep apnea, and renal cancer being seen today in relation to mesenteric lymphadenopathy. Patient is known to IR from left renal cryoablation procedure on 09/15/22. Patient underwent PET scan on 09/21/22 which revealed hypermetabolic retroperitoneal and mesenteric lymph nodes. Patient is under the care of Georga Kaufmann, PA-C from Oncology service who has referred patient to IR for image-guided lymph node biopsy.   Past Medical History:  Diagnosis Date   ADD (attention deficit disorder)    Allergy    hayfever   Anxiety    Back pain    Bipolar 1 disorder (HCC)    Cataract    right eye-removed   Chronic pain    Constipation    Decreased hearing    Depression    Diabetes mellitus without complication (HCC)    Type II   Dry skin    DVT (deep venous thrombosis) (HCC) 2004   leg (not sure which leg)   Dyslipidemia    Dyspnea    with exertion   Ear drainage    Excessive hunger    Excessive thirst    Eye pain    Fatigue    Frequent urination    GERD (gastroesophageal reflux disease)    Hard of hearing    History of kidney stones    Hyperlipidemia    Hypertension    Itching    Joint pain    Lactose intolerance    Leg cramps    Mouth sores    Nasal and sinus discharge    Nausea    Nausea with vomiting 09/27/2019   Numbness and tingling    bilateral   OSA (obstructive sleep apnea)    wears CPAP   Shortness of breath    Shortness of breath on exertion    Sinus congestion    Sleep apnea    uses cpap   Stiff neck    Stress    Swallowing difficulty    Swelling of extremity     Tinnitus    Trouble in sleeping    Urinary frequency    Vision changes    Weakness    Wears glasses     Past Surgical History:  Procedure Laterality Date   arm surgery Right    BACK SURGERY  2004   COLONOSCOPY     ESOPHAGOGASTRODUODENOSCOPY     EYE SURGERY Right    cataract   IR RADIOLOGIST EVAL & MGMT  07/21/2022   IR RADIOLOGIST EVAL & MGMT  08/26/2022   RADIOLOGY WITH ANESTHESIA N/A 09/15/2022   Procedure: CT CRYO ABLATION;  Surgeon: Roanna Banning, MD;  Location: [facility] ORS;  Service: Radiology;  Laterality: N/A;   SUBMANDIBULAR GLAND EXCISION Right 03/10/2016   Procedure: INTRAORAL  SUBMANDIBULAR  STONE REMOVAL;  Surgeon: Serena Colonel, MD;  Location: [facility] OR;  Service: ENT;  Laterality: Right;    Allergies: Baclofen, Butane, Other, Topiramate, Poractant alfa, Pork-derived products, Latex, and Lisinopril  Medications: Prior to Admission medications   Medication Sig Start Date End Date Taking? Authorizing Provider  acetaminophen (TYLENOL) 500 MG tablet Take 500 mg by mouth every 6 (six) hours as needed for moderate pain.   Yes [provider]  amLODipine (NORVASC) 10 MG tablet Take 10 mg by mouth daily.  06/29/12  Yes [provider]  Coenzyme Q10 (COQ-10 PO) Take 1 capsule by mouth daily.   Yes [provider]  DULoxetine (CYMBALTA) 30 MG capsule Take 30 mg by mouth at bedtime.   Yes [provider]  finasteride (PROSCAR) 5 MG tablet Take 5 mg by mouth daily.   Yes [provider]  losartan (COZAAR) 100 MG tablet Take 100 mg by mouth daily.   Yes [provider]  Magnesium 400 MG TABS Take 400 mg by mouth daily.   Yes [provider]  OVER THE COUNTER MEDICATION Take 1 capsule by mouth daily. OMEGA XL   Yes [provider]  OVER THE COUNTER MEDICATION Take 1 capsule by mouth daily. Omega Plus Pro   Yes [provider]  tirzepatide Greggory Keen) 10 MG/0.5ML Pen Inject 10 mg into the skin every Thursday.   Yes  [provider]  Bioflavonoid Products (BIOFLEX) TABS Take 1 tablet by mouth daily.    [provider]  HYDROcodone-acetaminophen (NORCO) 7.5-325 MG tablet Take 1 tablet by mouth 4 (four) times daily as needed for moderate pain. 12/08/21   [provider]  Menthol, Topical Analgesic, (ABSORBINE PAIN RELIEVING EX) Apply 1 patch topically daily as needed (pain).    [provider]  morphine (MS CONTIN) 15 MG 12 hr tablet Take 15 mg by mouth 2 (two) times daily as needed for pain.    [provider]  OVER THE COUNTER MEDICATION Take 1 tablet by mouth daily. Focus Factor    [provider]  OVER THE COUNTER MEDICATION Take 1 capsule by mouth daily. Ideal Prostate    [provider]  rosuvastatin (CRESTOR) 10 MG tablet Take 10 mg by mouth every evening. 10/20/21   [provider]  tiZANidine (ZANAFLEX) 2 MG tablet Take 2 mg by mouth every 8 (eight) hours as needed for muscle spasms. 06/05/19   [provider]  TURMERIC PO Take 1,500 mg by mouth daily.    [provider]     Family History  Problem Relation Age of Onset   Diabetes Mother    Hypertension Mother    Hyperlipidemia Mother    Kidney disease Mother    Cancer Mother    Breast cancer Mother    Diabetes Brother    Breast cancer Sister    Breast cancer Sister    Colon cancer Neg Hx    Colon polyps Neg Hx    Esophageal cancer Neg Hx    Rectal cancer Neg Hx    Stomach cancer Neg Hx     Social History   Socioeconomic History   Marital status: Married    Spouse name: Diplomatic Services operational officer   Number of children: Not on file   Years of education: Not on file   Highest education level: Not on file  Occupational History   Occupation: Disabled  Tobacco Use   Smoking status: Never   Smokeless tobacco: Never  Vaping Use   Vaping status: Never Used  Substance and Sexual Activity   Alcohol use: No   Drug use: No   Sexual activity: Yes  Other Topics Concern    Not on file  Social History Narrative   Not on file   Social Determinants of Health   Financial Resource Strain: Not on file  Food Insecurity: No Food Insecurity (09/15/2022)   Hunger Vital Sign    Worried About Running  Out of Food in the Last Year: Never true    Ran Out of Food in the Last Year: Never true  Transportation Needs: No Transportation Needs (09/15/2022)   PRAPARE - Administrator, Civil Service (Medical): No    Lack of Transportation (Non-Medical): No  Physical Activity: Not on file  Stress: Not on file  Social Connections: Unknown (08/06/2021)   Received from [facility]   Social Network    Social Network: Not on file    Code status: Full code  Review of Systems: A 12 point ROS discussed and pertinent positives are indicated in the HPI above.  All other systems are negative.  Review of Systems  Constitutional:  Negative for chills and fever.  Respiratory:  Negative for chest tightness and shortness of breath.   Cardiovascular:  Negative for chest pain and leg swelling.  Gastrointestinal:  Negative for abdominal pain, diarrhea, nausea and vomiting.  Neurological:  Negative for dizziness and headaches.  Psychiatric/Behavioral:  Negative for confusion.     Vital Signs: BP (!) 159/77   Pulse 70   Resp 16   Ht 5\' 11"  (1.803 m)   Wt 202 lb (91.6 kg)   SpO2 95%   BMI 28.17 kg/m    Physical Exam Vitals reviewed.  Constitutional:      General: He is not in acute distress.    Appearance: He is not ill-appearing.  HENT:     Mouth/Throat:     Mouth: Mucous membranes are moist.  Cardiovascular:     Rate and Rhythm: Normal rate and regular rhythm.     Pulses: Normal pulses.     Heart sounds: Normal heart sounds.  Pulmonary:     Effort: Pulmonary effort is normal.     Breath sounds: Normal breath sounds.  Abdominal:     Palpations: Abdomen is soft.     Tenderness: There is no abdominal tenderness.  Musculoskeletal:     Right lower leg: No  edema.     Left lower leg: No edema.  Skin:    General: Skin is warm and dry.  Neurological:     Mental Status: He is alert and oriented to person, place, and time.  Psychiatric:        Mood and Affect: Mood normal.        Behavior: Behavior normal.        Thought Content: Thought content normal.        Judgment: Judgment normal.     Imaging: NM PET Image Initial (PI) Skull Base To Thigh  Result Date: 09/23/2022 CLINICAL DATA:  Initial treatment strategy for abdominal lymphadenopathy. Renal cell carcinoma 02/17/2022 on biopsy. Recent ablation of left renal mass. EXAM: NUCLEAR MEDICINE PET SKULL BASE TO THIGH TECHNIQUE: 12.6 mCi F-18 FDG was injected intravenously. Full-ring PET imaging was performed from the skull base to thigh after the radiotracer. CT data was obtained and used for attenuation correction and anatomic localization. Fasting blood glucose: 98 mg/dl COMPARISON:  21/30/8657 chest abdomen and pelvic CTs. FINDINGS: Mediastinal blood pool activity: SUV max 2.2 Liver activity: SUV max 2.8 NECK: No areas of abnormal hypermetabolism. Incidental CT findings: No cervical adenopathy. CHEST: No pulmonary parenchymal or thoracic nodal hypermetabolism. Incidental CT findings: Mild cardiomegaly. Aortic and coronary artery calcification. Pulmonary artery enlargement, outflow tract 3.7 cm. Azygous fissure. Right hemidiaphragm elevation. ABDOMEN/PELVIS: Lateral limb left adrenal nodule measures 2.7 cm, 10 HU, and a S.U.V. max of 5.9 on 81/4. Similar in size and morphology to 08/04/2021.  Abdominal retroperitoneal small hypermetabolic nodes. Example left periaortic 9 mm node at a S.U.V. max of 4.5 on 97/4. This node is relatively similar in size on 08/04/2021. Hypermetabolic small bowel mesenteric adenopathy. An index jejunal mesenteric nodal mass measures 2.9 x 2.1 cm and a S.U.V. max of 6.5 on 100/4. Ileocolic mesenteric node measures 1.3 x 1.9 cm and a S.U.V. max of 6.2 on 114/4. No splenic  hypermetabolism. Incidental CT findings: Deferred to recent diagnostic CT. Bilateral renal collecting system calculi of up to 4 mm. 1.4 cm right adrenal nodule is similar back to 08/04/2021 and measures 11 HU. Left renal mass with surrounding ablation induced interstitial thickening. SKELETON: No abnormal marrow activity. Incidental CT findings: Lumbar spine fixation. IMPRESSION: 1. Abdominal retroperitoneal and small bowel mesenteric hypermetabolic nodes, favoring lymphoma. Presuming lymphoma, ([place]) 4 2. Hypermetabolic left adrenal nodule is low-density and stable back to 08/04/2021, favoring an adenoma. Right adrenal nodule is not hypermetabolic consistent with an adenoma. 3. Incidental findings, including: Coronary artery atherosclerosis. Aortic Atherosclerosis (ICD10-I70.0). Pulmonary artery enlargement suggests pulmonary arterial hypertension. Bilateral nephrolithiasis. Electronically Signed   By: Jeronimo Greaves M.D.   On: 09/23/2022 16:22   CT GUIDE TISSUE ABLATION  Result Date: 09/15/2022 CLINICAL DATA:  L renal mass. 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 EXAM: Procedures: 1. CT-GUIDED PERCUTANEOUS CRYOABLATION OF LEFT RENAL MASS ANESTHESIA/SEDATION: Sedation by the Anesthesia Team was performed. Please see anesthesiology log for details. MEDICATIONS: Rocephin 1 gm IV .The antibiotic was administered in an appropriate time interval prior to needle puncture of the skin. 30 mL 0.5% bupivacaine administered of the LEFT flank musculature. CONTRAST:  OMNIPAQUE IOHEXOL 350 MG/ML SOLN PROCEDURE: RADIATION DOSE REDUCTION: This exam was performed according to the departmental dose-optimization program which includes automated exposure control, adjustment of the mA and/or kV according to patient size and/or use of iterative reconstruction technique. The procedure, risks, benefits, and alternatives were explained to the patient and/or patient's representative. Questions regarding the procedure were  encouraged and answered. The patient understands and consents to the procedure. The patient was placed under general anesthesia. Initial un-enhanced CT was performed in a prone position to localize the inferior pole LEFT renal mass. The patient's LEFT flank was prepped with chlorhexidine in a sterile fashion, and a sterile drape was applied covering the operative field. A sterile gown and sterile gloves were used for the procedure. Under CT guidance, an 3x IceForce 2.1 CX percutaneous cryoablation probes were advanced into the LEFT renal mass. Probe positioning was confirmed by CT prior to cryoablation. Additionally, 3 hydro dissection needles were placed, to adequately separate the mass away from colon laterally and small bowel inferomedially. Cryoablation was performed using a standard protocol. An initial 10 minute cycle of cryoablation was performed. This was followed by a 8 minute thaw cycle. A second 10 minute cycle of cryoablation was then performed. During ablation, periodic CT imaging was performed to monitor ice ball formation and morphology. After active thaw, the cryoablation probes were removed. A contrasted post-procedural CT was performed. A dressing was placed. The patient tolerated the procedure well without immediate post procedural complication COMPLICATIONS: None immediate. FINDINGS: 1. Interval enlargement of now 3.4 cm LEFT inferior polar solid renal mass, consistent with biopsy-proven papillary RCC. 2. CT guided cryoablation using 3x IceForce 2.1 CX probes with adequate lesional coverage by the ice ball. IMPRESSION: Successful CT-guided percutaneous cryoablation of the LEFT renal carcinoma. PLAN: The patient will return to Vascular Interventional Radiology (VIR) for follow-up in 3 months with a contrasted,  multiphasic CT renal mass protocol. Roanna Banning, MD [facility] Radiology Electronically Signed   By: Roanna Banning M.D.   On: 09/15/2022  21:50    Labs:  CBC: Recent Labs    02/17/22 0653 05/12/22 1242 08/31/22 1010 09/06/22 1027  WBC 9.3 6.7 10.1 6.9  HGB 15.1 13.9 15.7 15.6  HCT 45.4 44.5 48.2 45.9  PLT 194 199 201 195    COAGS: Recent Labs    02/17/22 0653 08/31/22 1010  INR 1.0 1.0    BMP: Recent Labs    05/12/22 1242 08/31/22 1000 08/31/22 1010 09/06/22 1027  NA 136 140 138 139  K 3.9 4.2 4.1 3.8  CL 103 108 107 107  CO2 23 19* 22 24  GLUCOSE 408* 141* 139* 188*  BUN 16 26* 24* 20  CALCIUM 8.5* 8.6* 8.7* 9.0  CREATININE 0.80 0.85 0.73 0.80  GFRNONAA >60 >60 >60 >60    LIVER FUNCTION TESTS: Recent Labs    08/31/22 1000 09/06/22 1027  BILITOT 0.3 0.4  AST 21 17  ALT 26 21  ALKPHOS 70 73  PROT 7.3 7.3  ALBUMIN 4.2 4.5    TUMOR MARKERS: Recent Labs    09/06/22 1027  CEA 1.85    Assessment and Plan:  Trenell Concannon is a 77 yo male with left renal cancer being seen today in relation to hypermetabolic mesenteric lymphadenopathy. Patient has been referred to IR for image-guided mesenteric lymph node biopsy. Case was reviewed and approved by Dr Deanne Coffer with the caveat that there is a minimal window for biopsy that may make biopsy unsafe to be performed today. Patient presents today in their usual state of health and is NPO.   Risks and benefits of image-guided mesenteric lymph node biopsy was discussed with the patient and/or patient's family including, but not limited to bleeding, infection, damage to adjacent structures or low yield requiring additional tests. Additionally, the possibility that there would not be a safe window for biopsy today was discussed with the patient.  All of the questions were answered and there is agreement to proceed.  Consent signed and in chart.   Thank you for this interesting consult.  I greatly enjoyed meeting MURRIEL EIDEM and look forward to participating in their care.  A copy of this report was sent to the requesting provider on this  date.  Electronically Signed: Kennieth Francois, PA-C 10/14/2022, 9:12 AM   I spent a total of  15 Minutes in face to face in clinical consultation, greater than 50% of which was counseling/coordinating care for mesenteric lymphadenopathy.

## 2022-10-14 NOTE — Sedation Documentation (Signed)
Delay due to patient and family had concerns about procedure. Md gave details and address family concerns. Patient had to be reposition from supine to prone due to ct images.

## 2022-10-19 DIAGNOSIS — G4733 Obstructive sleep apnea (adult) (pediatric): Secondary | ICD-10-CM | POA: Diagnosis not present

## 2022-10-20 LAB — SURGICAL PATHOLOGY

## 2022-10-21 ENCOUNTER — Telehealth: Payer: Self-pay | Admitting: Physician Assistant

## 2022-10-21 DIAGNOSIS — Z794 Long term (current) use of insulin: Secondary | ICD-10-CM | POA: Diagnosis not present

## 2022-10-21 DIAGNOSIS — E1142 Type 2 diabetes mellitus with diabetic polyneuropathy: Secondary | ICD-10-CM | POA: Diagnosis not present

## 2022-10-21 DIAGNOSIS — E1169 Type 2 diabetes mellitus with other specified complication: Secondary | ICD-10-CM | POA: Diagnosis not present

## 2022-10-21 DIAGNOSIS — E1165 Type 2 diabetes mellitus with hyperglycemia: Secondary | ICD-10-CM | POA: Diagnosis not present

## 2022-10-21 DIAGNOSIS — C8590 Non-Hodgkin lymphoma, unspecified, unspecified site: Secondary | ICD-10-CM

## 2022-10-21 NOTE — Telephone Encounter (Signed)
I called [name] to review the pathology report from the retroperitoneal lymph node biopsy from  10/14/2022. 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 Surgical biopsy would be needed to further confirm diagnosis. Dr. Leonides Schanz recommends surveillance and return in 3 months with serial CT imaging.   [name] expressed understanding of the plan provided

## 2022-11-05 ENCOUNTER — Ambulatory Visit
Admission: RE | Admit: 2022-11-05 | Discharge: 2022-11-05 | Disposition: A | Discharge status: Home / Self Care | Payer: Medicare HMO | Source: Ambulatory Visit | Attending: Student | Admitting: Student

## 2022-11-05 DIAGNOSIS — C642 Malignant neoplasm of left kidney, except renal pelvis: Secondary | ICD-10-CM | POA: Diagnosis not present

## 2022-11-05 HISTORY — PX: IR RADIOLOGIST EVAL & MGMT: IMG5224

## 2022-11-05 NOTE — Progress Notes (Signed)
Reason for visit: L renal RCC. Post procedural follow up.   Care Team(s): PCP; Deatra James, Max [name] Endocrinology; Talmage Coin, Max [name] Urology; Winter,Christopher Clifton Custard Max [name] Pulmonary; Waymon Budge, Max [name]   Weight management; Langston Reusing, Max [name]  Cardiology; Maisie Fus, Max [name]   Virtual Visit via Telephone Note   I connected with Max [name] on 11/05/22 by telephone and verified that I am speaking with the correct person using two identifiers. I discussed the limitations, risks, security and privacy concerns of performing an evaluation and management service by telephone and the availability of in-person appointments. They are joined in the call by his wife, Max [name].  History of present illness:  Max [name] is a 77 y.o. male comorbid including PMHx significant for prostate CA on surveillance, anxiety, bipolar Dx, depression, obesity (BMI 32) and metabolic syndrome with poorly controlled T2DM. Last HbA1C 9.3 (05/12/22). Pt is followed by Urology, Dr Max [name], for an incidentally found 3 cm L renal mass on 08/05/21 during kidney stone workup, after he presented with flank pain and is known to VIR s/p Bx on 02/17/22 revealing papillary RCC. He was in discussion for surgical resection, however due to his DM and poorly controlled A1C he was referred for minimally invasive treatment with percutaneous ablation.  He underwent successful cryoablation by me on 09/15/22 and was discharged without event after overnight observation. He denies any significant post procedural discomfort, hematuria or other localizing symptoms.   Review of Systems: A 12-point ROS discussed, and pertinent positives are indicated in the HPI above.  All other systems are negative.   Past Medical History:  Diagnosis Date   ADD (attention deficit disorder)    Allergy    hayfever   Anxiety    Back pain    Bipolar 1 disorder (HCC)    Cataract    right eye-removed   Chronic pain    Constipation    Decreased  hearing    Depression    Diabetes mellitus without complication (HCC)    Type II   Dry skin    DVT (deep venous thrombosis) (HCC) 2004   leg (not sure which leg)   Dyslipidemia    Dyspnea    with exertion   Ear drainage    Excessive hunger    Excessive thirst    Eye pain    Fatigue    Frequent urination    GERD (gastroesophageal reflux disease)    Hard of hearing    History of kidney stones    Hyperlipidemia    Hypertension    Itching    Joint pain    Lactose intolerance    Leg cramps    Mouth sores    Nasal and sinus discharge    Nausea    Nausea with vomiting 09/27/2019   Numbness and tingling    bilateral   OSA (obstructive sleep apnea)    wears CPAP   Shortness of breath    Shortness of breath on exertion    Sinus congestion    Sleep apnea    uses cpap   Stiff neck    Stress    Swallowing difficulty    Swelling of extremity    Tinnitus    Trouble in sleeping    Urinary frequency    Vision changes    Weakness    Wears glasses     Past Surgical History:  Procedure Laterality Date   arm surgery Right    BACK SURGERY  2004  COLONOSCOPY     ESOPHAGOGASTRODUODENOSCOPY     EYE SURGERY Right    cataract   IR RADIOLOGIST EVAL & MGMT  07/21/2022   IR RADIOLOGIST EVAL & MGMT  08/26/2022   IR RADIOLOGIST EVAL & MGMT  11/05/2022   RADIOLOGY WITH ANESTHESIA N/A 09/15/2022   Procedure: CT CRYO ABLATION;  Surgeon: Max Banning, Max [name];  Location: [facility];  Service: Radiology;  Laterality: N/A;   SUBMANDIBULAR GLAND EXCISION Right 03/10/2016   Procedure: INTRAORAL  SUBMANDIBULAR  STONE REMOVAL;  Surgeon: Serena Colonel, Max [name];  Location: [facility] OR;  Service: ENT;  Laterality: Right;    Allergies: Baclofen, Butane, Other, Topiramate, Poractant alfa, Pork-derived products, Latex, and Lisinopril  Medications: Prior to Admission medications   Medication Sig Start Date End Date Taking? Authorizing Provider  acetaminophen (TYLENOL) 500 MG tablet Take 500 mg by mouth every 6 (six)  hours as needed for moderate pain.    Provider, Historical, Max [name]  amLODipine (NORVASC) 10 MG tablet Take 10 mg by mouth daily.  06/29/12   Provider, Historical, Max [name]  Bioflavonoid Products (BIOFLEX) TABS Take 1 tablet by mouth daily.    Provider, Historical, Max [name]  Coenzyme Q10 (COQ-10 PO) Take 1 capsule by mouth daily.    Provider, Historical, Max [name]  DULoxetine (CYMBALTA) 30 MG capsule Take 30 mg by mouth at bedtime.    Provider, Historical, Max [name]  finasteride (PROSCAR) 5 MG tablet Take 5 mg by mouth daily.    Provider, Historical, Max [name]  HYDROcodone-acetaminophen (NORCO) 7.5-325 MG tablet Take 1 tablet by mouth 4 (four) times daily as needed for moderate pain. 12/08/21   Provider, Historical, Max [name]  losartan (COZAAR) 100 MG tablet Take 100 mg by mouth daily.    Provider, Historical, Max [name]  Magnesium 400 MG TABS Take 400 mg by mouth daily.    Provider, Historical, Max [name]  Menthol, Topical Analgesic, (ABSORBINE PAIN RELIEVING EX) Apply 1 patch topically daily as needed (pain).    Provider, Historical, Max [name]  morphine (MS CONTIN) 15 MG 12 hr tablet Take 15 mg by mouth 2 (two) times daily as needed for pain.    Provider, Historical, Max [name]  OVER THE COUNTER MEDICATION Take 1 tablet by mouth daily. Focus Factor    Provider, Historical, Max [name]  OVER THE COUNTER MEDICATION Take 1 capsule by mouth daily. OMEGA XL    Provider, Historical, Max [name]  OVER THE COUNTER MEDICATION Take 1 capsule by mouth daily. Ideal Prostate    Provider, Historical, Max [name]  OVER THE COUNTER MEDICATION Take 1 capsule by mouth daily. Omega Plus Pro    Provider, Historical, Max [name]  rosuvastatin (CRESTOR) 10 MG tablet Take 10 mg by mouth every evening. 10/20/21   Provider, Historical, Max [name]  tirzepatide Greggory Keen) 10 MG/0.5ML Pen Inject 10 mg into the skin every Thursday.    Provider, Historical, Max [name]  tiZANidine (ZANAFLEX) 2 MG tablet Take 2 mg by mouth every 8 (eight) hours as needed for muscle spasms. 06/05/19   Provider, Historical, Max [name]  TURMERIC PO Take 1,500 mg by mouth daily.     Provider, Historical, Max [name]     Family History  Problem Relation Age of Onset   Diabetes Mother    Hypertension Mother    Hyperlipidemia Mother    Kidney disease Mother    Cancer Mother    Breast cancer Mother    Diabetes Brother    Breast cancer Sister    Breast cancer Sister    Colon cancer Neg Hx    Colon polyps Neg Hx    Esophageal cancer  Neg Hx    Rectal cancer Neg Hx    Stomach cancer Neg Hx     Social History   Socioeconomic History   Marital status: Married    Spouse name: Diplomatic Services operational officer   Number of children: Not on file   Years of education: Not on file   Highest education level: Not on file  Occupational History   Occupation: Disabled  Tobacco Use   Smoking status: Never   Smokeless tobacco: Never  Vaping Use   Vaping status: Never Used  Substance and Sexual Activity   Alcohol use: No   Drug use: No   Sexual activity: Yes  Other Topics Concern   Not on file  Social History Narrative   Not on file   Social Determinants of Health   Financial Resource Strain: Not on file  Food Insecurity: No Food Insecurity (09/15/2022)   Hunger Vital Sign    Worried About Running Out of Food in the Last Year: Never true    Ran Out of Food in the Last Year: Never true  Transportation Needs: No Transportation Needs (09/15/2022)   PRAPARE - Administrator, Civil Service (Medical): No    Lack of Transportation (Non-Medical): No  Physical Activity: Not on file  Stress: Not on file  Social Connections: Unknown (08/06/2021)   Received [facility]   Social Network    Social Network: Not on file     Vital Signs: There were no vitals taken for this visit.  Physical Exam Deferred secondary to virtual visit.  Imaging:  IR L renal mass cryoablation: 09/15/22 Adequate L inferior renal mass targeting and ablation.    Labs:  CBC: Recent Labs    05/12/22 1242 08/31/22 1010 09/06/22 1027 10/14/22 0833  WBC 6.7 10.1 6.9 8.7  HGB 13.9 15.7  15.6 14.5  HCT 44.5 48.2 45.9 44.2  PLT 199 201 195 218    COAGS: Recent Labs    02/17/22 0653 08/31/22 1010 10/14/22 0945  INR 1.0 1.0 1.1    BMP: Recent Labs    05/12/22 1242 08/31/22 1000 08/31/22 1010 09/06/22 1027  NA 136 140 138 139  K 3.9 4.2 4.1 3.8  CL 103 108 107 107  CO2 23 19* 22 24  GLUCOSE 408* 141* 139* 188*  BUN 16 26* 24* 20  CALCIUM 8.5* 8.6* 8.7* 9.0  CREATININE 0.80 0.85 0.73 0.80  GFRNONAA >60 >60 >60 >60    Oncology:  SURGICAL PATHOLOGY CASE: [id number] PATIENT: [facility] Surgical Pathology Report   Clinical History: indeterminate left renal lesion (cm)   FINAL MICROSCOPIC DIAGNOSIS:   A. KIDNEY, LEFT, NEEDLE CORE BIOPSY: - PAPILLARY RENAL CELL CARCINOMA, GRADE 2    Assessment and Plan:  77 y.o. male comorbid including PMHx significant for prostate CA on surveillance, depression, obesity (BMI 32) and metabolic syndrome with T2DM. Last HbA1C 9.3 (05/12/22). Baseline sCr < 1.   Incidentally found 3 cm L renal mass, Bx proven papillary RCC. Pt was followed by urology but declined for surgical resection given his comorbidities and poorly controlled A1C.  Pt now s/p L renal mass cryoablation on 09/15/22.  *Pt back to baseline. No post procedural concern. *follow up imaging w multiphasic CT, 3 mos post ablation (12/16/22), then 6 mos imaging interval / 9 mos post ablation (06/15/23), no sooner. *Will see him back, in virtual clinic, for imaging review. *Pt will also follow up with his Urologist, Dr. Liliane [name]   Electronically Signed:  Roanna Banning,  Max [name] [facility] Radiology   Pager. [phone] Clinic. [phone]  I spent a total of 25 Minutes in face to face in clinical consultation, greater than 50% of which was counseling/coordinating care for Mr Max [name] renal cancer follow up

## 2022-12-01 DIAGNOSIS — M545 Low back pain, unspecified: Secondary | ICD-10-CM | POA: Diagnosis not present

## 2022-12-07 DIAGNOSIS — M545 Low back pain, unspecified: Secondary | ICD-10-CM | POA: Diagnosis not present

## 2022-12-21 DIAGNOSIS — M545 Low back pain, unspecified: Secondary | ICD-10-CM | POA: Diagnosis not present

## 2022-12-27 DIAGNOSIS — M5416 Radiculopathy, lumbar region: Secondary | ICD-10-CM | POA: Diagnosis not present

## 2023-01-14 ENCOUNTER — Ambulatory Visit (HOSPITAL_COMMUNITY)
Admission: RE | Admit: 2023-01-14 | Discharge: 2023-01-14 | Disposition: A | Discharge status: Home / Self Care | Payer: Medicare HMO | Source: Ambulatory Visit | Attending: Physician Assistant | Admitting: Physician Assistant

## 2023-01-14 ENCOUNTER — Encounter (HOSPITAL_COMMUNITY): Payer: Self-pay

## 2023-01-14 DIAGNOSIS — Z8572 Personal history of non-Hodgkin lymphomas: Secondary | ICD-10-CM | POA: Diagnosis not present

## 2023-01-14 DIAGNOSIS — Z794 Long term (current) use of insulin: Secondary | ICD-10-CM | POA: Diagnosis not present

## 2023-01-14 DIAGNOSIS — C8590 Non-Hodgkin lymphoma, unspecified, unspecified site: Secondary | ICD-10-CM | POA: Insufficient documentation

## 2023-01-14 DIAGNOSIS — R590 Localized enlarged lymph nodes: Secondary | ICD-10-CM | POA: Diagnosis not present

## 2023-01-14 DIAGNOSIS — E1165 Type 2 diabetes mellitus with hyperglycemia: Secondary | ICD-10-CM | POA: Diagnosis not present

## 2023-01-14 DIAGNOSIS — N200 Calculus of kidney: Secondary | ICD-10-CM | POA: Diagnosis not present

## 2023-01-14 LAB — POCT I-STAT CREATININE: Creatinine, Ser: 0.7 mg/dL (ref 0.61–1.24)

## 2023-01-14 MED ORDER — IOHEXOL 300 MG/ML  SOLN
100.0000 mL | Freq: Once | INTRAMUSCULAR | Status: AC | PRN
  Administered 2023-01-14: 100 mL via INTRAVENOUS

## 2023-01-14 MED ORDER — SODIUM CHLORIDE (PF) 0.9 % IJ SOLN
INTRAMUSCULAR | Status: AC
  Filled 2023-01-14: qty 50

## 2023-01-17 ENCOUNTER — Inpatient Hospital Stay (HOSPITAL_BASED_OUTPATIENT_CLINIC_OR_DEPARTMENT_OTHER): Payer: Medicare HMO | Admitting: Hematology and Oncology

## 2023-01-17 ENCOUNTER — Inpatient Hospital Stay: Payer: Medicare HMO | Attending: Hematology and Oncology

## 2023-01-17 ENCOUNTER — Other Ambulatory Visit: Payer: Self-pay | Admitting: Hematology and Oncology

## 2023-01-17 VITALS — BP 150/73 | HR 76 | Temp 98.0°F | Resp 16 | Wt 210.8 lb

## 2023-01-17 DIAGNOSIS — R591 Generalized enlarged lymph nodes: Secondary | ICD-10-CM | POA: Diagnosis not present

## 2023-01-17 DIAGNOSIS — C8590 Non-Hodgkin lymphoma, unspecified, unspecified site: Secondary | ICD-10-CM

## 2023-01-17 DIAGNOSIS — R590 Localized enlarged lymph nodes: Secondary | ICD-10-CM | POA: Diagnosis not present

## 2023-01-17 DIAGNOSIS — Z803 Family history of malignant neoplasm of breast: Secondary | ICD-10-CM | POA: Diagnosis not present

## 2023-01-17 DIAGNOSIS — C642 Malignant neoplasm of left kidney, except renal pelvis: Secondary | ICD-10-CM | POA: Insufficient documentation

## 2023-01-17 LAB — CBC WITH DIFFERENTIAL (CANCER CENTER ONLY)
Abs Immature Granulocytes: 0.02 10*3/uL (ref 0.00–0.07)
Basophils Absolute: 0 10*3/uL (ref 0.0–0.1)
Basophils Relative: 0 %
Eosinophils Absolute: 0 10*3/uL (ref 0.0–0.5)
Eosinophils Relative: 0 %
HCT: 44.5 % (ref 39.0–52.0)
Hemoglobin: 14.8 g/dL (ref 13.0–17.0)
Immature Granulocytes: 0 %
Lymphocytes Relative: 17 %
Lymphs Abs: 1.6 10*3/uL (ref 0.7–4.0)
MCH: 27 pg (ref 26.0–34.0)
MCHC: 33.3 g/dL (ref 30.0–36.0)
MCV: 81.1 fL (ref 80.0–100.0)
Monocytes Absolute: 0.7 10*3/uL (ref 0.1–1.0)
Monocytes Relative: 7 %
Neutro Abs: 6.9 10*3/uL (ref 1.7–7.7)
Neutrophils Relative %: 76 %
Platelet Count: 204 10*3/uL (ref 150–400)
RBC: 5.49 MIL/uL (ref 4.22–5.81)
RDW: 13.1 % (ref 11.5–15.5)
WBC Count: 9.2 10*3/uL (ref 4.0–10.5)
nRBC: 0 % (ref 0.0–0.2)

## 2023-01-17 LAB — CMP (CANCER CENTER ONLY)
ALT: 17 U/L (ref 0–44)
AST: 17 U/L (ref 15–41)
Albumin: 4.2 g/dL (ref 3.5–5.0)
Alkaline Phosphatase: 98 U/L (ref 38–126)
Anion gap: 8 (ref 5–15)
BUN: 11 mg/dL (ref 8–23)
CO2: 24 mmol/L (ref 22–32)
Calcium: 9 mg/dL (ref 8.9–10.3)
Chloride: 103 mmol/L (ref 98–111)
Creatinine: 0.79 mg/dL (ref 0.61–1.24)
GFR, Estimated: >60 mL/min (ref >60)
Glucose, Bld: 223 mg/dL — ABNORMAL HIGH (ref 70–99)
Potassium: 4 mmol/L (ref 3.5–5.1)
Sodium: 135 mmol/L (ref 135–145)
Total Bilirubin: 0.3 mg/dL (ref 0.3–1.2)
Total Protein: 7.1 g/dL (ref 6.5–8.1)

## 2023-01-17 LAB — LACTATE DEHYDROGENASE: LDH: 163 U/L (ref 98–192)

## 2023-01-17 NOTE — Progress Notes (Signed)
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  08/12/2022: CT CAP- Interval enlargement of small bowel mesenteric lymph nodes, largest measuring 3.5 x 2.3 cm, previously 3.1 x 1.7 cm. Unchanged prominent retroperitoneal lymph nodes. Unchanged exophytic lesion of the left kidney measuring 3.1 x 2.9 cm, concerning for small papillary renal cell carcinoma.  09/06/2022: Establish care at [facility] 10/14/2022: lymph node biopsy shows an atypical lymphoid infiltrate, consistent with low grade lymphoma.   Interval History:  Max [name] 77 y.o. male with medical history significant for low grade lymphoma who presents for a follow up visit. The patient's last visit was on 09/06/2022. In the interim since the last visit he underwent a biopsy of follow up CT scan.   On exam today Max [name] reports he has had no changes in his health in the interim since her last visit.  He has no bumps or lumps concerning for lymphadenopathy of the underarms or neck.  He reports his energy is good at 8 out of 10 and that he is eating well.  He reports that his weight is steady.  He notes that "his dog walks him".  He notes that he is not having any nausea, vomiting, or diarrhea.  He does have some occasional cramping or musculoskeletal pain on his right side.  He describes it as "tenderness".  Otherwise he has not had any trouble with fevers, chills, sweats, nausea, vomiting or diarrhea.  A full 10 point ROS is otherwise negative.  The bulk of our discussion focused on the diagnosis of a low-grade lymphoma and the plan moving forward.  The patient voices understanding and willingness to proceed with continued monitoring and  surveillance with consideration of excisional biopsy or treatment in the event that the lymphoma were to flare.  MEDICAL HISTORY:  Past Medical History:  Diagnosis Date   ADD (attention deficit disorder)    Allergy    hayfever   Anxiety    Back pain    Bipolar 1 disorder (HCC)    Cataract    right eye-removed   Chronic pain    Constipation    Decreased hearing    Depression    Diabetes mellitus without complication (HCC)    Type II   Dry skin    DVT (deep venous thrombosis) (HCC) 2004   leg (not sure which leg)   Dyslipidemia    Dyspnea    with exertion   Ear drainage    Excessive hunger    Excessive thirst    Eye pain    Fatigue    Frequent urination    GERD (gastroesophageal reflux disease)    Hard of hearing    History of kidney stones    Hyperlipidemia    Hypertension    Itching    Joint pain    Lactose intolerance    Leg cramps    Mouth sores    Nasal and sinus discharge    Nausea    Nausea with vomiting 09/27/2019   Numbness and tingling    bilateral   OSA (obstructive sleep apnea)    wears CPAP   Shortness of breath    Shortness of breath on exertion    Sinus congestion    Sleep apnea  uses cpap   Stiff neck    Stress    Swallowing difficulty    Swelling of extremity    Tinnitus    Trouble in sleeping    Urinary frequency    Vision changes    Weakness    Wears glasses     SURGICAL HISTORY: Past Surgical History:  Procedure Laterality Date   arm surgery Right    BACK SURGERY  2004   COLONOSCOPY     ESOPHAGOGASTRODUODENOSCOPY     EYE SURGERY Right    cataract   IR RADIOLOGIST EVAL & MGMT  07/21/2022   IR RADIOLOGIST EVAL & MGMT  08/26/2022   IR RADIOLOGIST EVAL & MGMT  11/05/2022   RADIOLOGY WITH ANESTHESIA N/A 09/15/2022   Procedure: CT CRYO ABLATION;  Surgeon: Roanna Banning, MD;  Location: [facility];  Service: Radiology;  Laterality: N/A;   SUBMANDIBULAR GLAND EXCISION Right 03/10/2016   Procedure: INTRAORAL  SUBMANDIBULAR  STONE  REMOVAL;  Surgeon: Serena Colonel, MD;  Location: [facility] OR;  Service: ENT;  Laterality: Right;    SOCIAL HISTORY: Social History   Socioeconomic History   Marital status: Married    Spouse name: Diplomatic Services operational officer   Number of children: Not on file   Years of education: Not on file   Highest education level: Not on file  Occupational History   Occupation: Disabled  Tobacco Use   Smoking status: Never   Smokeless tobacco: Never  Vaping Use   Vaping status: Never Used  Substance and Sexual Activity   Alcohol use: No   Drug use: No   Sexual activity: Yes  Other Topics Concern   Not on file  Social History Narrative   Not on file   Social Determinants of Health   Financial Resource Strain: Not on file  Food Insecurity: No Food Insecurity (09/15/2022)   Hunger Vital Sign    Worried About Running Out of Food in the Last Year: Never true    Ran Out of Food in the Last Year: Never true  Transportation Needs: No Transportation Needs (09/15/2022)   PRAPARE - Administrator, Civil Service (Medical): No    Lack of Transportation (Non-Medical): No  Physical Activity: Not on file  Stress: Not on file  Social Connections: Unknown (08/06/2021)   [facility]   Social Network    Social Network: Not on file  Intimate Partner Violence: Not At Risk (09/15/2022)   Humiliation, Afraid, Rape, and Kick questionnaire    Fear of Current or Ex-Partner: No    Emotionally Abused: No    Physically Abused: No    Sexually Abused: No    FAMILY HISTORY: Family History  Problem Relation Age of Onset   Diabetes Mother    Hypertension Mother    Hyperlipidemia Mother    Kidney disease Mother    Cancer Mother    Breast cancer Mother    Diabetes Brother    Breast cancer Sister    Breast cancer Sister    Colon cancer Neg Hx    Colon polyps Neg Hx    Esophageal cancer Neg Hx    Rectal cancer Neg Hx    Stomach cancer Neg Hx     ALLERGIES:  is allergic to baclofen,  butane, other, topiramate, poractant alfa, pork-derived products, latex, and lisinopril.  MEDICATIONS:  Current Outpatient Medications  Medication Sig Dispense Refill   acetaminophen (TYLENOL) 500 MG tablet Take 500 mg by mouth every 6 (six) hours as  needed for moderate pain.     amLODipine (NORVASC) 10 MG tablet Take 10 mg by mouth daily.      Bioflavonoid Products (BIOFLEX) TABS Take 1 tablet by mouth daily.     Coenzyme Q10 (COQ-10 PO) Take 1 capsule by mouth daily.     DULoxetine (CYMBALTA) 30 MG capsule Take 30 mg by mouth at bedtime.     finasteride (PROSCAR) 5 MG tablet Take 5 mg by mouth daily.     HYDROcodone-acetaminophen (NORCO) 7.5-325 MG tablet Take 1 tablet by mouth 4 (four) times daily as needed for moderate pain.     losartan (COZAAR) 100 MG tablet Take 100 mg by mouth daily.     Magnesium 400 MG TABS Take 400 mg by mouth daily.     Menthol, Topical Analgesic, (ABSORBINE PAIN RELIEVING EX) Apply 1 patch topically daily as needed (pain).     morphine (MS CONTIN) 15 MG 12 hr tablet Take 15 mg by mouth 2 (two) times daily as needed for pain.     OVER THE COUNTER MEDICATION Take 1 tablet by mouth daily. Focus Factor     OVER THE COUNTER MEDICATION Take 1 capsule by mouth daily. OMEGA XL     OVER THE COUNTER MEDICATION Take 1 capsule by mouth daily. Ideal Prostate     OVER THE COUNTER MEDICATION Take 1 capsule by mouth daily. Omega Plus Pro     rosuvastatin (CRESTOR) 10 MG tablet Take 10 mg by mouth every evening.     tirzepatide [facility]) 10 MG/0.5ML Pen Inject 10 mg into the skin every Thursday.     tiZANidine (ZANAFLEX) 2 MG tablet Take 2 mg by mouth every 8 (eight) hours as needed for muscle spasms.     TURMERIC PO Take 1,500 mg by mouth daily.     No current facility-administered medications for this visit.    REVIEW OF SYSTEMS:   Constitutional: ( - ) fevers, ( - )  chills , ( - ) night sweats Eyes: ( - ) blurriness of vision, ( - ) double vision, ( - ) watery  eyes Ears, nose, mouth, throat, and face: ( - ) mucositis, ( - ) sore throat Respiratory: ( - ) cough, ( - ) dyspnea, ( - ) wheezes Cardiovascular: ( - ) palpitation, ( - ) chest discomfort, ( - ) lower extremity swelling Gastrointestinal:  ( - ) nausea, ( - ) heartburn, ( - ) change in bowel habits Skin: ( - ) abnormal skin rashes Lymphatics: ( - ) new lymphadenopathy, ( - ) easy bruising Neurological: ( - ) numbness, ( - ) tingling, ( - ) new weaknesses Behavioral/Psych: ( - ) mood change, ( - ) new changes  All other systems were reviewed with the patient and are negative.  PHYSICAL EXAMINATION: ECOG PERFORMANCE STATUS: 0 - Asymptomatic  Vitals:   01/17/23 1101  BP: (!) 150/73  Pulse: 76  Resp: 16  Temp: 98 F (36.7 C)  SpO2: 97%   Filed Weights   01/17/23 1101  Weight: 210 lb 12.8 oz (95.6 kg)    GENERAL: Well-appearing elderly African-American male, alert, no distress and comfortable SKIN: skin color, texture, turgor are normal, no rashes or significant lesions EYES: conjunctiva are pink and non-injected, sclera clear NECK: supple, non-tender LYMPH:  no palpable lymphadenopathy in the cervical, axillary or inguinal LUNGS: clear to auscultation and percussion with normal breathing effort HEART: regular rate & rhythm and no murmurs and no lower extremity edema Musculoskeletal: no cyanosis  of digits and no clubbing  PSYCH: alert & oriented x 3, fluent speech NEURO: no focal motor/sensory deficits  LABORATORY DATA:  I have reviewed the data as listed    Latest Ref Rng & Units 01/17/2023   10:31 AM 10/14/2022    8:33 AM 09/06/2022   10:27 AM  CBC  WBC 4.0 - 10.5 K/uL 9.2  8.7  6.9   Hemoglobin 13.0 - 17.0 g/dL 46.9  62.9  52.8   Hematocrit 39.0 - 52.0 % 44.5  44.2  45.9   Platelets 150 - 400 K/uL 204  218  195        Latest Ref Rng & Units 01/17/2023   10:31 AM 01/14/2023    8:42 AM 09/06/2022   10:27 AM  CMP  Glucose 70 - 99 mg/dL 413   244   BUN 8 - 23 mg/dL  11   20   Creatinine 0.10 - 1.24 mg/dL 2.72  5.36  6.44   Sodium 135 - 145 mmol/L 135   139   Potassium 3.5 - 5.1 mmol/L 4.0   3.8   Chloride 98 - 111 mmol/L 103   107   CO2 22 - 32 mmol/L 24   24   Calcium 8.9 - 10.3 mg/dL 9.0   9.0   Total Protein 6.5 - 8.1 g/dL 7.1   7.3   Total Bilirubin 0.3 - 1.2 mg/dL 0.3   0.4   Alkaline Phos 38 - 126 U/L 98   73   AST 15 - 41 U/L 17   17   ALT 0 - 44 U/L 17   21    RADIOGRAPHIC STUDIES: CT CHEST ABDOMEN PELVIS W CONTRAST  Result Date: 01/14/2023 CLINICAL DATA:  History of low-grade lymphoma, surveillance. * Tracking Code: BO * EXAM: CT CHEST, ABDOMEN, AND PELVIS WITH CONTRAST TECHNIQUE: Multidetector CT imaging of the chest, abdomen and pelvis was performed following the standard protocol during bolus administration of intravenous contrast. RADIATION DOSE REDUCTION: This exam was performed according to the departmental dose-optimization program which includes automated exposure control, adjustment of the mA and/or kV according to patient size and/or use of iterative reconstruction technique. CONTRAST:  OMNIPAQUE IOHEXOL 300 MG/ML  SOLN COMPARISON:  Multiple priors including most recent PET-CT September 21, 2022. FINDINGS: CT CHEST FINDINGS Cardiovascular: Aortic atherosclerosis. Three-vessel coronary artery calcifications. Normal size heart. No central pulmonary embolus on this nondedicated study. Enlarged main pulmonary artery measuring 3.7 cm. Mediastinum/Nodes: No suspicious thyroid nodule. No pathologically enlarged mediastinal, hilar or axillary lymph nodes. The esophagus is grossly unremarkable. Lungs/Pleura: No suspicious pulmonary nodules or masses. Azygous fissure, anatomic variant. Scattered atelectasis/scarring. Osteophyte related scarring in the paramedian right lower lobe. Musculoskeletal: No aggressive lytic or blastic lesion of bone. Multilevel degenerative change of the spine. CT ABDOMEN PELVIS FINDINGS Hepatobiliary: No suspicious hepatic  lesion. Gallbladder is unremarkable. No biliary ductal dilation. Pancreas: Pancreatic divisum, anatomic variant. No pancreatic ductal dilation or evidence of acute inflammation. Spleen: No splenomegaly or focal splenic lesion. Adrenals/Urinary Tract: Stable left adrenal nodule measuring 19 x 14 mm on image 66/2, unchanged when remeasured for consistency. Stable right adrenal nodule measuring 14 mm on image 62/2. These are both characterized as benign adenomas on MRI abdomen Aug 13, 2021 and requiring no additional independent imaging follow-up. Heterogeneous nodular area of the left lower pole kidney containing macroscopic fat measures 4.7 x 3.3 cm on image 80/2 corresponding with the previously described probable papillary type renal cell carcinoma. On PET-CT September 21, 2022  there was significant perinephric fluid/stranding of the previously well-defined lesion. Bilateral renal stones measure up to 4 mm. No new suspicious renal mass. Urinary bladder is unremarkable for degree of distension. Stomach/Bowel: Stomach is unremarkable for degree of distension. No pathologic dilation of small or large bowel. No evidence of acute bowel inflammation. Vascular/Lymphatic: Similar size of the retroperitoneal and mesenteric lymph nodes. For reference: -Lymph node in the small bowel mesentery measures 2.7 x 1.7 cm on image 78/2 previously 2.9 x 2.1 cm. -left periaortic lymph node measures 10 mm on image 68/2 previously 9 mm. Reproductive: Prostate is unremarkable. Other: No significant abdominopelvic free fluid. Musculoskeletal: L4-S1 posterior spinal fusion hardware with interbody disc spacers. Multilevel degenerative change of the spine. Degenerative change of the bilateral hips. IMPRESSION: 1. Similar size of the retroperitoneal and mesenteric lymph nodes. No new or enlarging lymph nodes in the chest, abdomen or pelvis. 2. Heterogeneous nodular area of the left lower pole kidney containing macroscopic fat measures 4.7 x 3.3 cm  corresponding with the previously described probable papillary type renal cell carcinoma. On PET-CT September 21, 2022 there was significant perinephric fluid/stranding of the previously well-defined lesion. Findings are compatible with post ablation effects. Suggest continued attention on dedicated follow-up renal mass protocol CT. 3. Enlarged main pulmonary artery measuring 3.7 cm, which can be seen in the setting of pulmonary hypertension. 4.  Aortic Atherosclerosis (ICD10-I70.0). Electronically Signed   By: Maudry Mayhew M.D.   On: 01/14/2023 11:31    ASSESSMENT & PLAN Max [name] 77 y.o. male with medical history significant for low grade lymphoma who presents for a follow up visit.  # Low Grade Lymphoma -- Core needle biopsy confirms the lymphadenopathy is an atypical lymphocyte population, most consistent with a low-grade lymphoma.  Ki-67 at 5%. -- Given the relatively benign appearance and behavior of this lymphoma would recommend continued monitoring at this time.  If there were to be progression would consider excisional biopsy and consideration of treatment. -- Patient voiced understanding of our plan moving forward. -- Recommend repeat CT scan imaging on an as-needed basis. -- Labs today show white blood cell 9.2, hemoglobin 14.8, MCV 81.1, and platelets of 204 -- Return to clinic in 6 months to reevaluate.  No orders of the defined types were placed in this encounter.   All questions were answered. The patient knows to call the clinic with any problems, questions or concerns.  A total of more than 30 minutes were spent on this encounter with face-to-face time and non-face-to-face time, including preparing to see the patient, ordering tests and/or medications, counseling the patient and coordination of care as outlined above.   Max Barns, MD Department of Hematology/Oncology [facility] at [facility] Phone: [phone] Pager: [phone] Email:  [email or web address]  01/17/2023 1:45 PM

## 2023-01-19 DIAGNOSIS — M791 Myalgia, unspecified site: Secondary | ICD-10-CM | POA: Diagnosis not present

## 2023-01-19 DIAGNOSIS — M545 Low back pain, unspecified: Secondary | ICD-10-CM | POA: Diagnosis not present

## 2023-01-21 DIAGNOSIS — E669 Obesity, unspecified: Secondary | ICD-10-CM | POA: Diagnosis not present

## 2023-01-21 DIAGNOSIS — E1142 Type 2 diabetes mellitus with diabetic polyneuropathy: Secondary | ICD-10-CM | POA: Diagnosis not present

## 2023-01-21 DIAGNOSIS — E1165 Type 2 diabetes mellitus with hyperglycemia: Secondary | ICD-10-CM | POA: Diagnosis not present

## 2023-01-21 DIAGNOSIS — E1169 Type 2 diabetes mellitus with other specified complication: Secondary | ICD-10-CM | POA: Diagnosis not present

## 2023-01-21 DIAGNOSIS — Z794 Long term (current) use of insulin: Secondary | ICD-10-CM | POA: Diagnosis not present

## 2023-02-09 DIAGNOSIS — Z79899 Other long term (current) drug therapy: Secondary | ICD-10-CM | POA: Diagnosis not present

## 2023-02-09 DIAGNOSIS — G894 Chronic pain syndrome: Secondary | ICD-10-CM | POA: Diagnosis not present

## 2023-03-09 DIAGNOSIS — E119 Type 2 diabetes mellitus without complications: Secondary | ICD-10-CM | POA: Diagnosis not present

## 2023-03-09 DIAGNOSIS — I700 Atherosclerosis of aorta: Secondary | ICD-10-CM | POA: Diagnosis not present

## 2023-03-09 DIAGNOSIS — C8590 Non-Hodgkin lymphoma, unspecified, unspecified site: Secondary | ICD-10-CM | POA: Diagnosis not present

## 2023-03-09 DIAGNOSIS — N401 Enlarged prostate with lower urinary tract symptoms: Secondary | ICD-10-CM | POA: Diagnosis not present

## 2023-03-09 DIAGNOSIS — Z1331 Encounter for screening for depression: Secondary | ICD-10-CM | POA: Diagnosis not present

## 2023-03-09 DIAGNOSIS — E785 Hyperlipidemia, unspecified: Secondary | ICD-10-CM | POA: Diagnosis not present

## 2023-03-09 DIAGNOSIS — I10 Essential (primary) hypertension: Secondary | ICD-10-CM | POA: Diagnosis not present

## 2023-03-09 DIAGNOSIS — M549 Dorsalgia, unspecified: Secondary | ICD-10-CM | POA: Diagnosis not present

## 2023-03-09 DIAGNOSIS — Z0000 Encounter for general adult medical examination without abnormal findings: Secondary | ICD-10-CM | POA: Diagnosis not present

## 2023-03-10 DIAGNOSIS — E1169 Type 2 diabetes mellitus with other specified complication: Secondary | ICD-10-CM | POA: Diagnosis not present

## 2023-03-10 DIAGNOSIS — E669 Obesity, unspecified: Secondary | ICD-10-CM | POA: Diagnosis not present

## 2023-03-10 DIAGNOSIS — Z794 Long term (current) use of insulin: Secondary | ICD-10-CM | POA: Diagnosis not present

## 2023-03-10 DIAGNOSIS — E1165 Type 2 diabetes mellitus with hyperglycemia: Secondary | ICD-10-CM | POA: Diagnosis not present

## 2023-03-10 DIAGNOSIS — E1142 Type 2 diabetes mellitus with diabetic polyneuropathy: Secondary | ICD-10-CM | POA: Diagnosis not present

## 2023-04-25 ENCOUNTER — Other Ambulatory Visit: Payer: Self-pay | Admitting: Urology

## 2023-04-25 DIAGNOSIS — C61 Malignant neoplasm of prostate: Secondary | ICD-10-CM

## 2023-06-02 ENCOUNTER — Ambulatory Visit
Admission: RE | Admit: 2023-06-02 | Discharge: 2023-06-02 | Disposition: A | Discharge status: Home / Self Care | Payer: Medicare HMO | Source: Ambulatory Visit | Attending: Urology | Admitting: Urology

## 2023-06-02 DIAGNOSIS — C61 Malignant neoplasm of prostate: Secondary | ICD-10-CM

## 2023-06-02 MED ORDER — GADOPICLENOL 0.5 MMOL/ML IV SOLN
8.0000 mL | Freq: Once | INTRAVENOUS | Status: AC | PRN
  Administered 2023-06-02: 8 mL via INTRAVENOUS

## 2023-06-09 ENCOUNTER — Telehealth: Payer: Self-pay | Admitting: Internal Medicine

## 2023-06-09 DIAGNOSIS — G4733 Obstructive sleep apnea (adult) (pediatric): Secondary | ICD-10-CM

## 2023-06-09 NOTE — Telephone Encounter (Signed)
 PT's CPAP died today/ He'd like to order a new one. His # is [phone]

## 2023-06-09 NOTE — Telephone Encounter (Signed)
 Called patient.  Patient states his CPAP has completely stopped working.  He has tried to fix the unit himself but states the unit is old and he does not think it can be fixed.  Patient would like a new CPAP as his is at least 76 years old or more.  Dr. Maple Hudson, can we send in an order for a new CPAP with supplies?  Patient uses [facility] as DME.

## 2023-06-10 NOTE — Telephone Encounter (Signed)
 Please order for [facility]- please replace old CPAP machine auto 8-20, mask of choice, humidifier, supplies, and install AirView/ card

## 2023-06-10 NOTE — Telephone Encounter (Signed)
 Left message on VM informing patient of  new replacement of CPAP machine and supplies.  Sent in order.

## 2023-06-13 ENCOUNTER — Telehealth: Payer: Self-pay | Admitting: Internal Medicine

## 2023-06-13 NOTE — Telephone Encounter (Signed)
 PT states his CPAP is not working at all. Please call to advise. Maybe 5 year older.[phone]

## 2023-06-13 NOTE — Telephone Encounter (Signed)
 Per Penni Homans at [place] this pt will need an office visit before an order can be filled per insurance guidelines. I do not see a visit in here from the last 6 months with dr young.   Will work in with Dr. Maple Hudson 06/14/2023.  Patient informed.

## 2023-06-13 NOTE — Telephone Encounter (Signed)
 Called patient.  Patient has OV 06/14/2023 with Dr. Maple Hudson

## 2023-06-13 NOTE — Telephone Encounter (Signed)
 Per Penni Homans at [place] this pt will need an office visit before an order can be filled per insurance guidelines. I do not see a visit in here from the last 6 months with dr young.

## 2023-06-14 ENCOUNTER — Ambulatory Visit: Admitting: Internal Medicine

## 2023-06-15 ENCOUNTER — Telehealth: Payer: Self-pay | Admitting: Internal Medicine

## 2023-06-15 NOTE — Telephone Encounter (Signed)
 Spoke with patient and his wife.  Patient made OV with Dr. Maple Hudson on 06/14/2023.  Patient was a No-Show for OV.  Left message for patient to call clinic to make an OV to meet insurance guidelines to get a new CPAP.

## 2023-06-15 NOTE — Telephone Encounter (Signed)
 Per Penni Homans at [place] this pt will need an office visit before an order can be filled per insurance guidelines. I do not see a visit in here from the last 6 months with dr young.

## 2023-06-16 NOTE — Telephone Encounter (Signed)
 Left message x 3 for patient to call clinic to make an OV to meet insurance guidelines to get a new CPAP.

## 2023-06-22 NOTE — Telephone Encounter (Signed)
 Appt made. NFN

## 2023-07-12 DIAGNOSIS — M461 Sacroiliitis, not elsewhere classified: Secondary | ICD-10-CM | POA: Diagnosis not present

## 2023-07-12 DIAGNOSIS — M47816 Spondylosis without myelopathy or radiculopathy, lumbar region: Secondary | ICD-10-CM | POA: Diagnosis not present

## 2023-07-12 DIAGNOSIS — M62830 Muscle spasm of back: Secondary | ICD-10-CM | POA: Diagnosis not present

## 2023-07-13 DIAGNOSIS — G894 Chronic pain syndrome: Secondary | ICD-10-CM | POA: Diagnosis not present

## 2023-07-13 DIAGNOSIS — M47816 Spondylosis without myelopathy or radiculopathy, lumbar region: Secondary | ICD-10-CM | POA: Diagnosis not present

## 2023-07-14 DIAGNOSIS — M461 Sacroiliitis, not elsewhere classified: Secondary | ICD-10-CM | POA: Diagnosis not present

## 2023-07-14 DIAGNOSIS — M62830 Muscle spasm of back: Secondary | ICD-10-CM | POA: Diagnosis not present

## 2023-07-14 DIAGNOSIS — M47816 Spondylosis without myelopathy or radiculopathy, lumbar region: Secondary | ICD-10-CM | POA: Diagnosis not present

## 2023-07-17 ENCOUNTER — Other Ambulatory Visit: Payer: Self-pay | Admitting: Hematology and Oncology

## 2023-07-17 DIAGNOSIS — C8590 Non-Hodgkin lymphoma, unspecified, unspecified site: Secondary | ICD-10-CM

## 2023-07-17 NOTE — Progress Notes (Deleted)
 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  08/12/2022: CT CAP- Interval enlargement of small bowel mesenteric lymph nodes, largest measuring 3.5 x 2.3 cm, previously 3.1 x 1.7 cm. Unchanged prominent retroperitoneal lymph nodes. Unchanged exophytic lesion of the left kidney measuring 3.1 x 2.9 cm, concerning for small papillary renal cell carcinoma.  09/06/2022: Establish care at [facility] 10/14/2022: lymph node biopsy shows an atypical lymphoid infiltrate, consistent with low grade lymphoma.   Interval History:  Max [name] 78 y.o. male with medical history significant for low grade lymphoma who presents for a follow up visit. The patient's last visit was on 01/17/2023. In the interim since the ***  On exam today Max [name] reports ***   MEDICAL HISTORY:  Past Medical History:  Diagnosis Date   ADD (attention deficit disorder)    Allergy    hayfever   Anxiety    Back pain    Bipolar 1 disorder (HCC)    Cataract    right eye-removed   Chronic pain    Constipation    Decreased hearing    Depression    Diabetes mellitus without complication (HCC)    Type II   Dry skin    DVT (deep venous thrombosis) (HCC) 2004   leg (not sure which leg)   Dyslipidemia    Dyspnea    with exertion   Ear drainage    Excessive hunger    Excessive thirst    Eye pain    Fatigue    Frequent urination    GERD (gastroesophageal reflux disease)    Hard of hearing    History of kidney stones    Hyperlipidemia    Hypertension    Itching    Joint pain    Lactose intolerance    Leg cramps    Mouth sores    Nasal and sinus discharge    Nausea    Nausea with vomiting 09/27/2019   Numbness and  tingling    bilateral   OSA (obstructive sleep apnea)    wears CPAP   Shortness of breath    Shortness of breath on exertion    Sinus congestion    Sleep apnea    uses cpap   Stiff neck    Stress    Swallowing difficulty    Swelling of extremity    Tinnitus    Trouble in sleeping    Urinary frequency    Vision changes    Weakness    Wears glasses     SURGICAL HISTORY: Past Surgical History:  Procedure Laterality Date   arm surgery Right    BACK SURGERY  2004   COLONOSCOPY     ESOPHAGOGASTRODUODENOSCOPY     EYE SURGERY Right    cataract   IR RADIOLOGIST EVAL & MGMT  07/21/2022   IR RADIOLOGIST EVAL & MGMT  08/26/2022   IR RADIOLOGIST EVAL & MGMT  11/05/2022   RADIOLOGY WITH ANESTHESIA N/A 09/15/2022   Procedure: CT CRYO ABLATION;  Surgeon: Art Largo, MD;  Location: [facility];  Service: Radiology;  Laterality: N/A;   SUBMANDIBULAR GLAND EXCISION Right 03/10/2016   Procedure: INTRAORAL  SUBMANDIBULAR  STONE REMOVAL;  Surgeon: Janita Mellow, MD;  Location: [facility];  Service: ENT;  Laterality:  Right;    SOCIAL HISTORY: Social History   Socioeconomic History   Marital status: Married    Spouse name: Diplomatic Services operational officer   Number of children: Not on file   Years of education: Not on file   Highest education level: Not on file  Occupational History   Occupation: Disabled  Tobacco Use   Smoking status: Never   Smokeless tobacco: Never  Vaping Use   Vaping status: Never Used  Substance and Sexual Activity   Alcohol use: No   Drug use: No   Sexual activity: Yes  Other Topics Concern   Not on file  Social History Narrative   Not on file   Social Drivers of Health   Financial Resource Strain: Not on file  Food Insecurity: No Food Insecurity (09/15/2022)   Hunger Vital Sign    Worried About Running Out of Food in the Last Year: Never true    Ran Out of Food in the Last Year: Never true  Transportation Needs: No Transportation Needs (09/15/2022)   PRAPARE - Scientist, research (physical sciences) (Medical): No    Lack of Transportation (Non-Medical): No  Physical Activity: Not on file  Stress: Not on file  Social Connections: Unknown (08/06/2021)   Received from [facility], [facility]   Social Network    Social Network: Not on file  Intimate Partner Violence: Not At Risk (09/15/2022)   Humiliation, Afraid, Rape, and Kick questionnaire    Fear of Current or Ex-Partner: No    Emotionally Abused: No    Physically Abused: No    Sexually Abused: No    FAMILY HISTORY: Family History  Problem Relation Age of Onset   Diabetes Mother    Hypertension Mother    Hyperlipidemia Mother    Kidney disease Mother    Cancer Mother    Breast cancer Mother    Diabetes Brother    Breast cancer Sister    Breast cancer Sister    Colon cancer Neg Hx    Colon polyps Neg Hx    Esophageal cancer Neg Hx    Rectal cancer Neg Hx    Stomach cancer Neg Hx     ALLERGIES:  is allergic to baclofen, butane, other, topiramate, poractant alfa, pork-derived products, latex, and lisinopril.  MEDICATIONS:  Current Outpatient Medications  Medication Sig Dispense Refill   acetaminophen  (TYLENOL ) 500 MG tablet Take 500 mg by mouth every 6 (six) hours as needed for moderate pain.     amLODipine  (NORVASC ) 10 MG tablet Take 10 mg by mouth daily.      Bioflavonoid Products (BIOFLEX) TABS Take 1 tablet by mouth daily.     Coenzyme Q10 (COQ-10 PO) Take 1 capsule by mouth daily.     DULoxetine  (CYMBALTA ) 30 MG capsule Take 30 mg by mouth at bedtime.     finasteride  (PROSCAR ) 5 MG tablet Take 5 mg by mouth daily.     losartan  (COZAAR ) 100 MG tablet Take 100 mg by mouth daily.     Magnesium  400 MG TABS Take 400 mg by mouth daily.     Menthol, Topical Analgesic, (ABSORBINE PAIN RELIEVING EX) Apply 1 patch topically daily as needed (pain).     morphine  (MS CONTIN ) 15 MG 12 hr tablet Take 15 mg by mouth 2 (two) times daily as needed for pain.     OVER THE COUNTER MEDICATION Take 1 tablet by  mouth daily. Focus Factor     OVER THE COUNTER MEDICATION Take 1 capsule  by mouth daily. OMEGA XL     OVER THE COUNTER MEDICATION Take 1 capsule by mouth daily. Ideal Prostate     OVER THE COUNTER MEDICATION Take 1 capsule by mouth daily. Omega Plus Pro     rosuvastatin (CRESTOR) 10 MG tablet Take 10 mg by mouth every evening.     tirzepatide  (MOUNJARO ) 10 MG/0.5ML Pen Inject 10 mg into the skin every Thursday.     tiZANidine (ZANAFLEX) 2 MG tablet Take 2 mg by mouth every 8 (eight) hours as needed for muscle spasms.     TURMERIC PO Take 1,500 mg by mouth daily.     No current facility-administered medications for this visit.    REVIEW OF SYSTEMS:   Constitutional: ( - ) fevers, ( - )  chills , ( - ) night sweats Eyes: ( - ) blurriness of vision, ( - ) double vision, ( - ) watery eyes Ears, nose, mouth, throat, and face: ( - ) mucositis, ( - ) sore throat Respiratory: ( - ) cough, ( - ) dyspnea, ( - ) wheezes Cardiovascular: ( - ) palpitation, ( - ) chest discomfort, ( - ) lower extremity swelling Gastrointestinal:  ( - ) nausea, ( - ) heartburn, ( - ) change in bowel habits Skin: ( - ) abnormal skin rashes Lymphatics: ( - ) new lymphadenopathy, ( - ) easy bruising Neurological: ( - ) numbness, ( - ) tingling, ( - ) new weaknesses Behavioral/Psych: ( - ) mood change, ( - ) new changes  All other systems were reviewed with the patient and are negative.  PHYSICAL EXAMINATION: ECOG PERFORMANCE STATUS: 0 - Asymptomatic  There were no vitals filed for this visit.  There were no vitals filed for this visit.   GENERAL: Well-appearing elderly African-American male, alert, no distress and comfortable SKIN: skin color, texture, turgor are normal, no rashes or significant lesions EYES: conjunctiva are pink and non-injected, sclera clear NECK: supple, non-tender LYMPH:  no palpable lymphadenopathy in the cervical, axillary or inguinal LUNGS: clear to auscultation and percussion with normal  breathing effort HEART: regular rate & rhythm and no murmurs and no lower extremity edema Musculoskeletal: no cyanosis of digits and no clubbing  PSYCH: alert & oriented x 3, fluent speech NEURO: no focal motor/sensory deficits  LABORATORY DATA:  I have reviewed the data as listed    Latest Ref Rng & Units 01/17/2023   10:31 AM 10/14/2022    8:33 AM 09/06/2022   10:27 AM  CBC  WBC 4.0 - 10.5 K/uL 9.2  8.7  6.9   Hemoglobin 13.0 - 17.0 g/dL 54.0  98.1  19.1   Hematocrit 39.0 - 52.0 % 44.5  44.2  45.9   Platelets 150 - 400 K/uL 204  218  195        Latest Ref Rng & Units 01/17/2023   10:31 AM 01/14/2023    8:42 AM 09/06/2022   10:27 AM  CMP  Glucose 70 - 99 mg/dL 478   295   BUN 8 - 23 mg/dL 11   20   Creatinine 6.21 - 1.24 mg/dL 3.08  6.57  8.46   Sodium 135 - 145 mmol/L 135   139   Potassium 3.5 - 5.1 mmol/L 4.0   3.8   Chloride 98 - 111 mmol/L 103   107   CO2 22 - 32 mmol/L 24   24   Calcium 8.9 - 10.3 mg/dL 9.0   9.0   Total Protein 6.5 -  8.1 g/dL 7.1   7.3   Total Bilirubin 0.3 - 1.2 mg/dL 0.3   0.4   Alkaline Phos 38 - 126 U/L 98   73   AST 15 - 41 U/L 17   17   ALT 0 - 44 U/L 17   21    RADIOGRAPHIC STUDIES: No results found.  ASSESSMENT & PLAN Max [name] 78 y.o. male with medical history significant for low grade lymphoma who presents for a follow up visit.  # Low Grade Lymphoma -- Core needle biopsy confirms the lymphadenopathy is an atypical lymphocyte population, most consistent with a low-grade lymphoma.  Ki-67 at 5%. -- Given the relatively benign appearance and behavior of this lymphoma would recommend continued monitoring at this time.  If there were to be progression would consider excisional biopsy and consideration of treatment. -- Patient voiced understanding of our plan moving forward. -- Recommend repeat CT scan imaging on an as-needed basis. -- Labs today show white blood cell *** -- Return to clinic in 6 months to reevaluate.  No orders  of the defined types were placed in this encounter.   All questions were answered. The patient knows to call the clinic with any problems, questions or concerns.  A total of more than 30 minutes were spent on this encounter with face-to-face time and non-face-to-face time, including preparing to see the patient, ordering tests and/or medications, counseling the patient and coordination of care as outlined above.   Rogerio Clay, MD Department of Hematology/Oncology [facility] at [facility] Phone: [phone] Pager: [phone] Email: [email or web address]  07/17/2023 5:41 PM

## 2023-07-18 ENCOUNTER — Inpatient Hospital Stay: Payer: Medicare (Managed Care) | Attending: Hematology and Oncology

## 2023-07-18 ENCOUNTER — Inpatient Hospital Stay: Payer: Medicare (Managed Care) | Admitting: Hematology and Oncology

## 2023-07-18 DIAGNOSIS — C642 Malignant neoplasm of left kidney, except renal pelvis: Secondary | ICD-10-CM | POA: Insufficient documentation

## 2023-07-18 DIAGNOSIS — M549 Dorsalgia, unspecified: Secondary | ICD-10-CM | POA: Insufficient documentation

## 2023-07-18 DIAGNOSIS — Z803 Family history of malignant neoplasm of breast: Secondary | ICD-10-CM | POA: Insufficient documentation

## 2023-07-18 DIAGNOSIS — C8590 Non-Hodgkin lymphoma, unspecified, unspecified site: Secondary | ICD-10-CM | POA: Insufficient documentation

## 2023-07-18 DIAGNOSIS — G8929 Other chronic pain: Secondary | ICD-10-CM | POA: Insufficient documentation

## 2023-07-20 ENCOUNTER — Telehealth: Payer: Self-pay | Admitting: Hematology and Oncology

## 2023-07-20 NOTE — Progress Notes (Unsigned)
 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  08/12/2022: CT CAP- Interval enlargement of small bowel mesenteric lymph nodes, largest measuring 3.5 x 2.3 cm, previously 3.1 x 1.7 cm. Unchanged prominent retroperitoneal lymph nodes. Unchanged exophytic lesion of the left kidney measuring 3.1 x 2.9 cm, concerning for small papillary renal cell carcinoma.  09/06/2022: Establish care at [facility] 10/14/2022: lymph node biopsy shows an atypical lymphoid infiltrate, consistent with low grade lymphoma.   Interval History:  Max [name] 78 y.o. male with medical history significant for low grade lymphoma who presents for a follow up visit. The patient's last visit was on 01/17/2023. In the interim since the ***  On exam today Max [name] reports ***   MEDICAL HISTORY:  Past Medical History:  Diagnosis Date   ADD (attention deficit disorder)    Allergy    hayfever   Anxiety    Back pain    Bipolar 1 disorder (HCC)    Cataract    right eye-removed   Chronic pain    Constipation    Decreased hearing    Depression    Diabetes mellitus without complication (HCC)    Type II   Dry skin    DVT (deep venous thrombosis) (HCC) 2004   leg (not sure which leg)   Dyslipidemia    Dyspnea    with exertion   Ear drainage    Excessive hunger    Excessive thirst    Eye pain    Fatigue    Frequent urination    GERD (gastroesophageal reflux disease)    Hard of hearing    History of kidney stones    Hyperlipidemia    Hypertension    Itching    Joint pain    Lactose intolerance    Leg cramps    Mouth sores    Nasal and sinus discharge    Nausea    Nausea with vomiting 09/27/2019   Numbness and  tingling    bilateral   OSA (obstructive sleep apnea)    wears CPAP   Shortness of breath    Shortness of breath on exertion    Sinus congestion    Sleep apnea    uses cpap   Stiff neck    Stress    Swallowing difficulty    Swelling of extremity    Tinnitus    Trouble in sleeping    Urinary frequency    Vision changes    Weakness    Wears glasses     SURGICAL HISTORY: Past Surgical History:  Procedure Laterality Date   arm surgery Right    BACK SURGERY  2004   COLONOSCOPY     ESOPHAGOGASTRODUODENOSCOPY     EYE SURGERY Right    cataract   IR RADIOLOGIST EVAL & MGMT  07/21/2022   IR RADIOLOGIST EVAL & MGMT  08/26/2022   IR RADIOLOGIST EVAL & MGMT  11/05/2022   RADIOLOGY WITH ANESTHESIA N/A 09/15/2022   Procedure: CT CRYO ABLATION;  Surgeon: Art Largo, MD;  Location: [facility];  Service: Radiology;  Laterality: N/A;   SUBMANDIBULAR GLAND EXCISION Right 03/10/2016   Procedure: INTRAORAL  SUBMANDIBULAR  STONE REMOVAL;  Surgeon: Janita Mellow, MD;  Location: [facility] OR;  Service: ENT;  Laterality:  Right;    SOCIAL HISTORY: Social History   Socioeconomic History   Marital status: Married    Spouse name: Diplomatic Services operational officer   Number of children: Not on file   Years of education: Not on file   Highest education level: Not on file  Occupational History   Occupation: Disabled  Tobacco Use   Smoking status: Never   Smokeless tobacco: Never  Vaping Use   Vaping status: Never Used  Substance and Sexual Activity   Alcohol use: No   Drug use: No   Sexual activity: Yes  Other Topics Concern   Not on file  Social History Narrative   Not on file   Social Drivers of Health   Financial Resource Strain: Not on file  Food Insecurity: No Food Insecurity (09/15/2022)   Hunger Vital Sign    Worried About Running Out of Food in the Last Year: Never true    Ran Out of Food in the Last Year: Never true  Transportation Needs: No Transportation Needs (09/15/2022)   PRAPARE - Scientist, research (physical sciences) (Medical): No    Lack of Transportation (Non-Medical): No  Physical Activity: Not on file  Stress: Not on file  Social Connections: Unknown (08/06/2021)   Received from [facility], [facility]   Social Network    Social Network: Not on file  Intimate Partner Violence: Not At Risk (09/15/2022)   Humiliation, Afraid, Rape, and Kick questionnaire    Fear of Current or Ex-Partner: No    Emotionally Abused: No    Physically Abused: No    Sexually Abused: No    FAMILY HISTORY: Family History  Problem Relation Age of Onset   Diabetes Mother    Hypertension Mother    Hyperlipidemia Mother    Kidney disease Mother    Cancer Mother    Breast cancer Mother    Diabetes Brother    Breast cancer Sister    Breast cancer Sister    Colon cancer Neg Hx    Colon polyps Neg Hx    Esophageal cancer Neg Hx    Rectal cancer Neg Hx    Stomach cancer Neg Hx     ALLERGIES:  is allergic to baclofen, butane, other, topiramate, poractant alfa, pork-derived products, latex, and lisinopril.  MEDICATIONS:  Current Outpatient Medications  Medication Sig Dispense Refill   acetaminophen  (TYLENOL ) 500 MG tablet Take 500 mg by mouth every 6 (six) hours as needed for moderate pain.     amLODipine  (NORVASC ) 10 MG tablet Take 10 mg by mouth daily.      Bioflavonoid Products (BIOFLEX) TABS Take 1 tablet by mouth daily.     Coenzyme Q10 (COQ-10 PO) Take 1 capsule by mouth daily.     DULoxetine  (CYMBALTA ) 30 MG capsule Take 30 mg by mouth at bedtime.     finasteride  (PROSCAR ) 5 MG tablet Take 5 mg by mouth daily.     losartan  (COZAAR ) 100 MG tablet Take 100 mg by mouth daily.     Magnesium  400 MG TABS Take 400 mg by mouth daily.     Menthol, Topical Analgesic, (ABSORBINE PAIN RELIEVING EX) Apply 1 patch topically daily as needed (pain).     morphine  (MS CONTIN ) 15 MG 12 hr tablet Take 15 mg by mouth 2 (two) times daily as needed for pain.     OVER THE COUNTER MEDICATION Take 1 tablet by  mouth daily. Focus Factor     OVER THE COUNTER MEDICATION Take 1 capsule  by mouth daily. OMEGA XL     OVER THE COUNTER MEDICATION Take 1 capsule by mouth daily. Ideal Prostate     OVER THE COUNTER MEDICATION Take 1 capsule by mouth daily. Omega Plus Pro     rosuvastatin (CRESTOR) 10 MG tablet Take 10 mg by mouth every evening.     tirzepatide  (MOUNJARO ) 10 MG/0.5ML Pen Inject 10 mg into the skin every Thursday.     tiZANidine (ZANAFLEX) 2 MG tablet Take 2 mg by mouth every 8 (eight) hours as needed for muscle spasms.     TURMERIC PO Take 1,500 mg by mouth daily.     No current facility-administered medications for this visit.    REVIEW OF SYSTEMS:   Constitutional: ( - ) fevers, ( - )  chills , ( - ) night sweats Eyes: ( - ) blurriness of vision, ( - ) double vision, ( - ) watery eyes Ears, nose, mouth, throat, and face: ( - ) mucositis, ( - ) sore throat Respiratory: ( - ) cough, ( - ) dyspnea, ( - ) wheezes Cardiovascular: ( - ) palpitation, ( - ) chest discomfort, ( - ) lower extremity swelling Gastrointestinal:  ( - ) nausea, ( - ) heartburn, ( - ) change in bowel habits Skin: ( - ) abnormal skin rashes Lymphatics: ( - ) new lymphadenopathy, ( - ) easy bruising Neurological: ( - ) numbness, ( - ) tingling, ( - ) new weaknesses Behavioral/Psych: ( - ) mood change, ( - ) new changes  All other systems were reviewed with the patient and are negative.  PHYSICAL EXAMINATION: ECOG PERFORMANCE STATUS: 0 - Asymptomatic  There were no vitals filed for this visit.  There were no vitals filed for this visit.   GENERAL: Well-appearing elderly African-American male, alert, no distress and comfortable SKIN: skin color, texture, turgor are normal, no rashes or significant lesions EYES: conjunctiva are pink and non-injected, sclera clear NECK: supple, non-tender LYMPH:  no palpable lymphadenopathy in the cervical, axillary or inguinal LUNGS: clear to auscultation and percussion with normal  breathing effort HEART: regular rate & rhythm and no murmurs and no lower extremity edema Musculoskeletal: no cyanosis of digits and no clubbing  PSYCH: alert & oriented x 3, fluent speech NEURO: no focal motor/sensory deficits  LABORATORY DATA:  I have reviewed the data as listed    Latest Ref Rng & Units 01/17/2023   10:31 AM 10/14/2022    8:33 AM 09/06/2022   10:27 AM  CBC  WBC 4.0 - 10.5 K/uL 9.2  8.7  6.9   Hemoglobin 13.0 - 17.0 g/dL 16.1  09.6  04.5   Hematocrit 39.0 - 52.0 % 44.5  44.2  45.9   Platelets 150 - 400 K/uL 204  218  195        Latest Ref Rng & Units 01/17/2023   10:31 AM 01/14/2023    8:42 AM 09/06/2022   10:27 AM  CMP  Glucose 70 - 99 mg/dL 409   811   BUN 8 - 23 mg/dL 11   20   Creatinine 9.14 - 1.24 mg/dL 7.82  9.56  2.13   Sodium 135 - 145 mmol/L 135   139   Potassium 3.5 - 5.1 mmol/L 4.0   3.8   Chloride 98 - 111 mmol/L 103   107   CO2 22 - 32 mmol/L 24   24   Calcium 8.9 - 10.3 mg/dL 9.0   9.0   Total Protein 6.5 -  8.1 g/dL 7.1   7.3   Total Bilirubin 0.3 - 1.2 mg/dL 0.3   0.4   Alkaline Phos 38 - 126 U/L 98   73   AST 15 - 41 U/L 17   17   ALT 0 - 44 U/L 17   21    RADIOGRAPHIC STUDIES: No results found.  ASSESSMENT & PLAN Max [name] 78 y.o. male with medical history significant for low grade lymphoma who presents for a follow up visit.  # Low Grade Lymphoma -- Core needle biopsy confirms the lymphadenopathy is an atypical lymphocyte population, most consistent with a low-grade lymphoma.  Ki-67 at 5%. -- Given the relatively benign appearance and behavior of this lymphoma would recommend continued monitoring at this time.  If there were to be progression would consider excisional biopsy and consideration of treatment. -- Patient voiced understanding of our plan moving forward. -- Recommend repeat CT scan imaging on an as-needed basis. -- Labs today show white blood cell *** -- Return to clinic in 6 months to reevaluate.  No orders  of the defined types were placed in this encounter.   All questions were answered. The patient knows to call the clinic with any problems, questions or concerns.  A total of more than 30 minutes were spent on this encounter with face-to-face time and non-face-to-face time, including preparing to see the patient, ordering tests and/or medications, counseling the patient and coordination of care as outlined above.   Rogerio Clay, MD Department of Hematology/Oncology [facility] at [facility] Phone: [phone] Pager: [phone] Email: [email or web address]  07/20/2023 10:26 PM

## 2023-07-21 ENCOUNTER — Inpatient Hospital Stay (HOSPITAL_BASED_OUTPATIENT_CLINIC_OR_DEPARTMENT_OTHER): Payer: Medicare (Managed Care) | Admitting: Hematology and Oncology

## 2023-07-21 ENCOUNTER — Inpatient Hospital Stay: Payer: Medicare (Managed Care)

## 2023-07-21 VITALS — BP 136/69 | HR 81 | Temp 98.5°F | Resp 15 | Wt 217.7 lb

## 2023-07-21 DIAGNOSIS — C642 Malignant neoplasm of left kidney, except renal pelvis: Secondary | ICD-10-CM | POA: Diagnosis not present

## 2023-07-21 DIAGNOSIS — R590 Localized enlarged lymph nodes: Secondary | ICD-10-CM | POA: Diagnosis not present

## 2023-07-21 DIAGNOSIS — M549 Dorsalgia, unspecified: Secondary | ICD-10-CM | POA: Diagnosis not present

## 2023-07-21 DIAGNOSIS — C8590 Non-Hodgkin lymphoma, unspecified, unspecified site: Secondary | ICD-10-CM

## 2023-07-21 DIAGNOSIS — G8929 Other chronic pain: Secondary | ICD-10-CM | POA: Diagnosis not present

## 2023-07-21 DIAGNOSIS — Z803 Family history of malignant neoplasm of breast: Secondary | ICD-10-CM | POA: Diagnosis not present

## 2023-07-21 LAB — CMP (CANCER CENTER ONLY)
ALT: 22 U/L (ref 0–44)
AST: 19 U/L (ref 15–41)
Albumin: 4.1 g/dL (ref 3.5–5.0)
Alkaline Phosphatase: 105 U/L (ref 38–126)
Anion gap: 5 (ref 5–15)
BUN: 18 mg/dL (ref 8–23)
CO2: 26 mmol/L (ref 22–32)
Calcium: 8.6 mg/dL — ABNORMAL LOW (ref 8.9–10.3)
Chloride: 108 mmol/L (ref 98–111)
Creatinine: 0.79 mg/dL (ref 0.61–1.24)
GFR, Estimated: >60 mL/min (ref >60)
Glucose, Bld: 192 mg/dL — ABNORMAL HIGH (ref 70–99)
Potassium: 4.2 mmol/L (ref 3.5–5.1)
Sodium: 139 mmol/L (ref 135–145)
Total Bilirubin: 0.4 mg/dL (ref 0.0–1.2)
Total Protein: 6.6 g/dL (ref 6.5–8.1)

## 2023-07-21 LAB — CBC WITH DIFFERENTIAL (CANCER CENTER ONLY)
Abs Immature Granulocytes: 0.02 10*3/uL (ref 0.00–0.07)
Basophils Absolute: 0 10*3/uL (ref 0.0–0.1)
Basophils Relative: 0 %
Eosinophils Absolute: 0.2 10*3/uL (ref 0.0–0.5)
Eosinophils Relative: 2 %
HCT: 42 % (ref 39.0–52.0)
Hemoglobin: 14.1 g/dL (ref 13.0–17.0)
Immature Granulocytes: 0 %
Lymphocytes Relative: 25 %
Lymphs Abs: 1.8 10*3/uL (ref 0.7–4.0)
MCH: 26.9 pg (ref 26.0–34.0)
MCHC: 33.6 g/dL (ref 30.0–36.0)
MCV: 80 fL (ref 80.0–100.0)
Monocytes Absolute: 0.7 10*3/uL (ref 0.1–1.0)
Monocytes Relative: 9 %
Neutro Abs: 4.6 10*3/uL (ref 1.7–7.7)
Neutrophils Relative %: 64 %
Platelet Count: 157 10*3/uL (ref 150–400)
RBC: 5.25 MIL/uL (ref 4.22–5.81)
RDW: 13.1 % (ref 11.5–15.5)
WBC Count: 7.3 10*3/uL (ref 4.0–10.5)
nRBC: 0 % (ref 0.0–0.2)

## 2023-07-21 LAB — LACTATE DEHYDROGENASE: LDH: 158 U/L (ref 98–192)

## 2023-07-21 MED ORDER — TRAMADOL HCL 50 MG PO TABS
50.0000 mg | ORAL_TABLET | Freq: Four times a day (QID) | ORAL | 0 refills | Status: AC | PRN
Start: 2023-07-21 — End: ?

## 2023-07-25 NOTE — Progress Notes (Deleted)
 HPI  male never smoker followed for OSA, complicated by DM2, hx DVT, HBP, morbid obesity, Lymphoma NPSG 2010- faint copy in EMR ===========================================================================================  07/26/22- 78 year old male never smoker followed for OSA, Asthma, complicated by DM2, hx DVT, HTN,  morbid obesity, Hyperlipidemia, LRenal Cell Cancer,  CPAP auto 8-20/ [employer] Download-compliance 100%, AHI 4.8/ hr Body weight today-226 lbs Download reviewed.  He sleeps better with CPAP and denies problems.  We reviewed comfort issues.  He is not yet eligible for replacement machine. Asthma has been controlled, uncomplicated, not needing inhaler.  07/26/23- 77 year old male never smoker followed for OSA, Asthma, complicated by DM2, hx DVT, HTN,  morbid obesity, Hyperlipidemia, LRenal Cell Cancer, Lymphoma,  CPAP auto 8-20/ [employer] Download-compliance  Body weight today-    ROS-see HPI   + = positive Constitutional:   + weight loss, night sweats, fevers, chills, +fatigue, lassitude. HEENT:   No-  headaches, difficulty swallowing, tooth/dental problems, sore throat,       No-  sneezing, itching, ear ache, +nasal congestion, post nasal drip,  CV:  No-   chest pain, orthopnea, PND, swelling in lower extremities, anasarca, dizziness, palpitations Resp: No-   shortness of breath with exertion or at rest.              No-   productive cough,  No non-productive cough,  No- coughing up of blood.              No-   change in color of mucus.  + wheezing.   Skin: No-   rash or lesions. GI:  No-   heartburn, indigestion, abdominal pain, nausea, vomiting,  GU: . MS:  No-   joint pain or swelling.  + back pain. Neuro-     nothing unusual Psych:  No- change in mood or affect. No depression or anxiety.  No memory loss.  OBJ- Physical Exam General- Alert, Oriented, Affect-appropriate,  +  obese Skin- rash-none, lesions- none, excoriation- none Lymphadenopathy- none Head-  atraumatic.             Eyes- Gross vision intact, PERRLA, conjunctivae and secretions clear            Ears- +Hearing aid/ HOH            Nose- Clear, no-Septal dev, mucus, polyps, erosion, perforation             Throat- Mallampati III , mucosa clear , drainage- none, tonsils- atrophic. +Dentures Neck- flexible , trachea midline, no stridor , thyroid  nl, carotid no bruit Chest - symmetrical excursion , unlabored           Heart/CV- RRR , no murmur , no gallop  , no rub, nl s1 s2                           - JVD- none , edema- none, stasis changes- none, varices- none           Lung- clear,  wheeze -none, cough- none , dullness-none, rub- none           Chest wall-  Abd-  Br/ Gen/ Rectal- Not done, not indicated Extrem- cyanosis- none, clubbing, none, atrophy- none, strength- nl Neuro-

## 2023-07-26 ENCOUNTER — Telehealth: Payer: Self-pay | Admitting: Internal Medicine

## 2023-07-26 ENCOUNTER — Ambulatory Visit: Payer: Medicare Other | Admitting: Internal Medicine

## 2023-07-26 DIAGNOSIS — E785 Hyperlipidemia, unspecified: Secondary | ICD-10-CM | POA: Diagnosis not present

## 2023-07-26 DIAGNOSIS — G4733 Obstructive sleep apnea (adult) (pediatric): Secondary | ICD-10-CM | POA: Diagnosis not present

## 2023-07-26 DIAGNOSIS — G8929 Other chronic pain: Secondary | ICD-10-CM | POA: Diagnosis not present

## 2023-07-26 DIAGNOSIS — I10 Essential (primary) hypertension: Secondary | ICD-10-CM | POA: Diagnosis not present

## 2023-07-26 DIAGNOSIS — E1142 Type 2 diabetes mellitus with diabetic polyneuropathy: Secondary | ICD-10-CM | POA: Diagnosis not present

## 2023-07-26 NOTE — Telephone Encounter (Signed)
 Patient states needs order for CPAP machine. Patient scheduled 09/29/2023 with Dr, Linder Revere. Patient phone number is [phone].

## 2023-07-26 NOTE — Telephone Encounter (Signed)
 07/26/2023 There is a note in chart stating: Max [name]    06/15/23 11:29 AM Note Per Jennett Model at [facility] this pt will need an office visit before an order can be filled per insurance guidelines. I do not see a visit in here from the last 6 months with dr young.           Patient had OV today with Dr. Linder Revere at 9:30 am and canceled the appointment this morning. Patient was a No-Show on 06/14/2023 with Dr. Linder Revere. Called patient to inform that he needs an OV before a CPAP can be ordered per insurance.  Patient will keep his upcoming appointment on 09/29/2023.  Patient verbalized understanding.

## 2023-08-10 DIAGNOSIS — G894 Chronic pain syndrome: Secondary | ICD-10-CM | POA: Diagnosis not present

## 2023-08-10 DIAGNOSIS — M47816 Spondylosis without myelopathy or radiculopathy, lumbar region: Secondary | ICD-10-CM | POA: Diagnosis not present

## 2023-08-11 DIAGNOSIS — M62830 Muscle spasm of back: Secondary | ICD-10-CM | POA: Diagnosis not present

## 2023-08-11 DIAGNOSIS — M461 Sacroiliitis, not elsewhere classified: Secondary | ICD-10-CM | POA: Diagnosis not present

## 2023-08-11 DIAGNOSIS — M47816 Spondylosis without myelopathy or radiculopathy, lumbar region: Secondary | ICD-10-CM | POA: Diagnosis not present

## 2023-08-13 DIAGNOSIS — M62830 Muscle spasm of back: Secondary | ICD-10-CM | POA: Diagnosis not present

## 2023-08-13 DIAGNOSIS — M47816 Spondylosis without myelopathy or radiculopathy, lumbar region: Secondary | ICD-10-CM | POA: Diagnosis not present

## 2023-08-13 DIAGNOSIS — M461 Sacroiliitis, not elsewhere classified: Secondary | ICD-10-CM | POA: Diagnosis not present

## 2023-09-06 DIAGNOSIS — E1165 Type 2 diabetes mellitus with hyperglycemia: Secondary | ICD-10-CM | POA: Diagnosis not present

## 2023-09-06 DIAGNOSIS — Z794 Long term (current) use of insulin: Secondary | ICD-10-CM | POA: Diagnosis not present

## 2023-09-06 DIAGNOSIS — E1142 Type 2 diabetes mellitus with diabetic polyneuropathy: Secondary | ICD-10-CM | POA: Diagnosis not present

## 2023-09-06 DIAGNOSIS — E1169 Type 2 diabetes mellitus with other specified complication: Secondary | ICD-10-CM | POA: Diagnosis not present

## 2023-09-07 DIAGNOSIS — M47816 Spondylosis without myelopathy or radiculopathy, lumbar region: Secondary | ICD-10-CM | POA: Diagnosis not present

## 2023-09-07 DIAGNOSIS — G894 Chronic pain syndrome: Secondary | ICD-10-CM | POA: Diagnosis not present

## 2023-09-11 DIAGNOSIS — M461 Sacroiliitis, not elsewhere classified: Secondary | ICD-10-CM | POA: Diagnosis not present

## 2023-09-11 DIAGNOSIS — M47816 Spondylosis without myelopathy or radiculopathy, lumbar region: Secondary | ICD-10-CM | POA: Diagnosis not present

## 2023-09-11 DIAGNOSIS — M62830 Muscle spasm of back: Secondary | ICD-10-CM | POA: Diagnosis not present

## 2023-09-29 ENCOUNTER — Ambulatory Visit: Admitting: Internal Medicine

## 2023-10-03 DIAGNOSIS — E1169 Type 2 diabetes mellitus with other specified complication: Secondary | ICD-10-CM | POA: Diagnosis not present

## 2023-10-03 DIAGNOSIS — Z794 Long term (current) use of insulin: Secondary | ICD-10-CM | POA: Diagnosis not present

## 2023-10-03 DIAGNOSIS — E1142 Type 2 diabetes mellitus with diabetic polyneuropathy: Secondary | ICD-10-CM | POA: Diagnosis not present

## 2023-10-03 DIAGNOSIS — E1165 Type 2 diabetes mellitus with hyperglycemia: Secondary | ICD-10-CM | POA: Diagnosis not present

## 2023-10-05 DIAGNOSIS — G894 Chronic pain syndrome: Secondary | ICD-10-CM | POA: Diagnosis not present

## 2023-10-05 DIAGNOSIS — M47816 Spondylosis without myelopathy or radiculopathy, lumbar region: Secondary | ICD-10-CM | POA: Diagnosis not present

## 2023-10-11 DIAGNOSIS — M47816 Spondylosis without myelopathy or radiculopathy, lumbar region: Secondary | ICD-10-CM | POA: Diagnosis not present

## 2023-10-11 DIAGNOSIS — M62830 Muscle spasm of back: Secondary | ICD-10-CM | POA: Diagnosis not present

## 2023-10-11 DIAGNOSIS — M461 Sacroiliitis, not elsewhere classified: Secondary | ICD-10-CM | POA: Diagnosis not present

## 2023-10-31 NOTE — Progress Notes (Deleted)
 HPI  male never smoker followed for OSA, complicated by DM, hx DVT, HBP, morbid obesity -------------------     07/26/22- 78 year old male never smoker followed for OSA, Asthma, complicated by DM2, hx DVT, HTN,  morbid obesity, Hyperlipidemia, LRenal Cell Cancer,  CPAP auto 8-20/ [employer] Download-compliance 100%, AHI 4.8/ hr Body weight today-226 lbs Download reviewed.  He sleeps better with CPAP and denies problems.  We reviewed comfort issues.  He is not yet eligible for replacement machine. Asthma has been controlled, uncomplicated, not needing inhaler.  11/01/23- 78 year old male never smoker followed for OSA, Asthma, complicated by DM2, hx DVT, HTN,  morbid obesity, Hyperlipidemia, LRenal Cell Cancer,  CPAP auto 8-20/ [employer] Download-compliance  Body weight today- Patient had reported in March that CPAP wasn't working. [employer] said he needed OV before they could replace. He has cancelled and no-showed since then.  ROS-see HPI   + = positive Constitutional:   + weight loss, night sweats, fevers, chills, +fatigue, lassitude. HEENT:   No-  headaches, difficulty swallowing, tooth/dental problems, sore throat,       No-  sneezing, itching, ear ache, +nasal congestion, post nasal drip,  CV:  No-   chest pain, orthopnea, PND, swelling in lower extremities, anasarca, dizziness, palpitations Resp: No-   shortness of breath with exertion or at rest.              No-   productive cough,  No non-productive cough,  No- coughing up of blood.              No-   change in color of mucus.  + wheezing.   Skin: No-   rash or lesions. GI:  No-   heartburn, indigestion, abdominal pain, nausea, vomiting,  GU: . MS:  No-   joint pain or swelling.  + back pain. Neuro-     nothing unusual Psych:  No- change in mood or affect. No depression or anxiety.  No memory loss.  OBJ- Physical Exam General- Alert, Oriented, Affect-appropriate,  +  obese Skin- rash-none, lesions- none, excoriation-  none Lymphadenopathy- none Head- atraumatic.             Eyes- Gross vision intact, PERRLA, conjunctivae and secretions clear            Ears- +Hearing aid/ HOH            Nose- Clear, no-Septal dev, mucus, polyps, erosion, perforation             Throat- Mallampati III , mucosa clear , drainage- none, tonsils- atrophic. +Dentures Neck- flexible , trachea midline, no stridor , thyroid  nl, carotid no bruit Chest - symmetrical excursion , unlabored           Heart/CV- RRR , no murmur , no gallop  , no rub, nl s1 s2                           - JVD- none , edema- none, stasis changes- none, varices- none           Lung- clear,  wheeze -none, cough- none , dullness-none, rub- none           Chest wall-  Abd-  Br/ Gen/ Rectal- Not done, not indicated Extrem- cyanosis- none, clubbing, none, atrophy- none, strength- nl Neuro-

## 2023-11-01 ENCOUNTER — Encounter: Payer: Self-pay | Admitting: Internal Medicine

## 2023-11-01 ENCOUNTER — Ambulatory Visit: Admitting: Internal Medicine

## 2023-11-01 DIAGNOSIS — G4733 Obstructive sleep apnea (adult) (pediatric): Secondary | ICD-10-CM

## 2023-11-03 DIAGNOSIS — M47816 Spondylosis without myelopathy or radiculopathy, lumbar region: Secondary | ICD-10-CM | POA: Diagnosis not present

## 2023-11-03 DIAGNOSIS — G894 Chronic pain syndrome: Secondary | ICD-10-CM | POA: Diagnosis not present

## 2023-11-03 DIAGNOSIS — E1142 Type 2 diabetes mellitus with diabetic polyneuropathy: Secondary | ICD-10-CM | POA: Diagnosis not present

## 2023-11-03 NOTE — Telephone Encounter (Signed)
 Hey Dr. Neysa, Patient has canceled  3 OV appointments and is needing a office visit to be able to get CPAP. Unfortunately this order was on 03/14. Can we cancel this DME order?

## 2023-11-03 NOTE — Telephone Encounter (Signed)
 Yes, ok to cancel order

## 2023-11-11 DIAGNOSIS — M47816 Spondylosis without myelopathy or radiculopathy, lumbar region: Secondary | ICD-10-CM | POA: Diagnosis not present

## 2023-11-11 DIAGNOSIS — M461 Sacroiliitis, not elsewhere classified: Secondary | ICD-10-CM | POA: Diagnosis not present

## 2023-11-11 DIAGNOSIS — M62830 Muscle spasm of back: Secondary | ICD-10-CM | POA: Diagnosis not present

## 2023-12-01 DIAGNOSIS — Z79891 Long term (current) use of opiate analgesic: Secondary | ICD-10-CM | POA: Diagnosis not present

## 2023-12-01 DIAGNOSIS — G894 Chronic pain syndrome: Secondary | ICD-10-CM | POA: Diagnosis not present

## 2023-12-01 DIAGNOSIS — M47816 Spondylosis without myelopathy or radiculopathy, lumbar region: Secondary | ICD-10-CM | POA: Diagnosis not present

## 2023-12-05 DIAGNOSIS — E1142 Type 2 diabetes mellitus with diabetic polyneuropathy: Secondary | ICD-10-CM | POA: Diagnosis not present

## 2023-12-06 DIAGNOSIS — Z79891 Long term (current) use of opiate analgesic: Secondary | ICD-10-CM | POA: Diagnosis not present

## 2023-12-08 DIAGNOSIS — E1169 Type 2 diabetes mellitus with other specified complication: Secondary | ICD-10-CM | POA: Diagnosis not present

## 2023-12-08 DIAGNOSIS — E1142 Type 2 diabetes mellitus with diabetic polyneuropathy: Secondary | ICD-10-CM | POA: Diagnosis not present

## 2023-12-12 DIAGNOSIS — M47816 Spondylosis without myelopathy or radiculopathy, lumbar region: Secondary | ICD-10-CM | POA: Diagnosis not present

## 2023-12-12 DIAGNOSIS — M461 Sacroiliitis, not elsewhere classified: Secondary | ICD-10-CM | POA: Diagnosis not present

## 2023-12-12 DIAGNOSIS — M62830 Muscle spasm of back: Secondary | ICD-10-CM | POA: Diagnosis not present

## 2024-01-03 ENCOUNTER — Other Ambulatory Visit: Payer: Self-pay | Admitting: Interventional Radiology

## 2024-01-03 DIAGNOSIS — N2889 Other specified disorders of kidney and ureter: Secondary | ICD-10-CM

## 2024-01-04 DIAGNOSIS — E1142 Type 2 diabetes mellitus with diabetic polyneuropathy: Secondary | ICD-10-CM | POA: Diagnosis not present

## 2024-01-09 ENCOUNTER — Inpatient Hospital Stay: Admission: RE | Admit: 2024-01-09 | Source: Ambulatory Visit

## 2024-01-11 DIAGNOSIS — M47816 Spondylosis without myelopathy or radiculopathy, lumbar region: Secondary | ICD-10-CM | POA: Diagnosis not present

## 2024-01-11 DIAGNOSIS — M62830 Muscle spasm of back: Secondary | ICD-10-CM | POA: Diagnosis not present

## 2024-01-11 DIAGNOSIS — M461 Sacroiliitis, not elsewhere classified: Secondary | ICD-10-CM | POA: Diagnosis not present

## 2024-01-13 DIAGNOSIS — E1169 Type 2 diabetes mellitus with other specified complication: Secondary | ICD-10-CM | POA: Diagnosis not present

## 2024-01-13 DIAGNOSIS — I10 Essential (primary) hypertension: Secondary | ICD-10-CM | POA: Diagnosis not present

## 2024-01-13 DIAGNOSIS — R42 Dizziness and giddiness: Secondary | ICD-10-CM | POA: Diagnosis not present

## 2024-01-19 ENCOUNTER — Other Ambulatory Visit: Payer: Self-pay | Admitting: Physician Assistant

## 2024-01-19 DIAGNOSIS — C8590 Non-Hodgkin lymphoma, unspecified, unspecified site: Secondary | ICD-10-CM

## 2024-01-19 NOTE — Progress Notes (Unsigned)
 [facility] [phone]   [phone]  PROGRESS NOTE  Patient Care Team: Sun, Vyvyan, MD as PCP - General (Family Medicine) Alvan Ronal BRAVO, MD (Inactive) as PCP - Cardiology (Cardiology)  Hematological/Oncological History # Low Grade Lymphoma # Left Kidney Mass  02/17/2022: Left kidney biopsy-papillary renal cell carcinoma, grade 2.  08/12/2022: CT CAP- Interval enlargement of small bowel mesenteric lymph nodes, largest measuring 3.5 x 2.3 cm, previously 3.1 x 1.7 cm. Unchanged prominent retroperitoneal lymph nodes. Unchanged exophytic lesion of the left kidney measuring 3.1 x 2.9 cm, concerning for small papillary renal cell carcinoma.  09/06/2022: Establish care at [facility] 09/15/2022: L renal mass cryoablation  10/14/2022: lymph node biopsy shows an atypical lymphoid infiltrate, consistent with low grade lymphoma.    Interval History:  Max [name] 78 y.o. male with medical history significant for low grade lymphoma who presents for a follow up visit. The patient's last visit was on 07/21/23. In the interim since the last visit he has had no major changes in his health.  On exam today Max [name] reports*** Full 10 point ROS is otherwise negative.   MEDICAL HISTORY:  Past Medical History:  Diagnosis Date   ADD (attention deficit disorder)    Allergy    hayfever   Anxiety    Back pain    Bipolar 1 disorder (HCC)    Cataract    right eye-removed   Chronic pain    Constipation    Decreased hearing    Depression    Diabetes mellitus without complication (HCC)    Type II   Dry skin    DVT (deep venous thrombosis) (HCC) 2004   leg (not sure which leg)   Dyslipidemia    Dyspnea    with exertion   Ear drainage    Excessive hunger    Excessive thirst    Eye pain    Fatigue    Frequent urination    GERD (gastroesophageal reflux disease)    Hard of hearing    History of kidney stones    Hyperlipidemia    Hypertension    Itching     Joint pain    Lactose intolerance    Leg cramps    Mouth sores    Nasal and sinus discharge    Nausea    Nausea with vomiting 09/27/2019   Numbness and tingling    bilateral   OSA (obstructive sleep apnea)    wears CPAP   Shortness of breath    Shortness of breath on exertion    Sinus congestion    Sleep apnea    uses cpap   Stiff neck    Stress    Swallowing difficulty    Swelling of extremity    Tinnitus    Trouble in sleeping    Urinary frequency    Vision changes    Weakness    Wears glasses     SURGICAL HISTORY: Past Surgical History:  Procedure Laterality Date   arm surgery Right    BACK SURGERY  2004   COLONOSCOPY     ESOPHAGOGASTRODUODENOSCOPY     EYE SURGERY Right    cataract   IR RADIOLOGIST EVAL & MGMT  07/21/2022   IR RADIOLOGIST EVAL & MGMT  08/26/2022   IR RADIOLOGIST EVAL & MGMT  11/05/2022   RADIOLOGY WITH ANESTHESIA N/A 09/15/2022   Procedure: CT CRYO ABLATION;  Surgeon: Hughes Simmonds, MD;  Location: [facility];  Service: Radiology;  Laterality: N/A;  SUBMANDIBULAR GLAND EXCISION Right 03/10/2016   Procedure: INTRAORAL  SUBMANDIBULAR  STONE REMOVAL;  Surgeon: Ida Loader, MD;  Location: [facility];  Service: ENT;  Laterality: Right;    SOCIAL HISTORY: Social History   Socioeconomic History   Marital status: Married    Spouse name: Diplomatic Services operational officer   Number of children: Not on file   Years of education: Not on file   Highest education level: Not on file  Occupational History   Occupation: Disabled  Tobacco Use   Smoking status: Never   Smokeless tobacco: Never  Vaping Use   Vaping status: Never Used  Substance and Sexual Activity   Alcohol use: No   Drug use: No   Sexual activity: Yes  Other Topics Concern   Not on file  Social History Narrative   Not on file   Social Drivers of Health   Financial Resource Strain: Not on file  Food Insecurity: No Food Insecurity (09/15/2022)   Hunger Vital Sign    Worried About Running Out of Food in the Last Year:  Never true    Ran Out of Food in the Last Year: Never true  Transportation Needs: No Transportation Needs (09/15/2022)   PRAPARE - Administrator, Civil Service (Medical): No    Lack of Transportation (Non-Medical): No  Physical Activity: Not on file  Stress: Not on file  Social Connections: Unknown (08/06/2021)   Received from [facility]   Social Network    Social Network: Not on file  Intimate Partner Violence: Not At Risk (09/15/2022)   Humiliation, Afraid, Rape, and Kick questionnaire    Fear of Current or Ex-Partner: No    Emotionally Abused: No    Physically Abused: No    Sexually Abused: No    FAMILY HISTORY: Family History  Problem Relation Age of Onset   Diabetes Mother    Hypertension Mother    Hyperlipidemia Mother    Kidney disease Mother    Cancer Mother    Breast cancer Mother    Diabetes Brother    Breast cancer Sister    Breast cancer Sister    Colon cancer Neg Hx    Colon polyps Neg Hx    Esophageal cancer Neg Hx    Rectal cancer Neg Hx    Stomach cancer Neg Hx     ALLERGIES:  is allergic to baclofen, butane, other, topiramate, poractant alfa, porcine (pork) protein-containing drug products, latex, and lisinopril.  MEDICATIONS:  Current Outpatient Medications  Medication Sig Dispense Refill   acetaminophen  (TYLENOL ) 500 MG tablet Take 500 mg by mouth every 6 (six) hours as needed for moderate pain.     amLODipine  (NORVASC ) 10 MG tablet Take 10 mg by mouth daily.      Bioflavonoid Products (BIOFLEX) TABS Take 1 tablet by mouth daily.     Coenzyme Q10 (COQ-10 PO) Take 1 capsule by mouth daily.     DULoxetine  (CYMBALTA ) 30 MG capsule Take 30 mg by mouth at bedtime.     finasteride  (PROSCAR ) 5 MG tablet Take 5 mg by mouth daily.     losartan  (COZAAR ) 100 MG tablet Take 100 mg by mouth daily.     Magnesium  400 MG TABS Take 400 mg by mouth daily.     Menthol, Topical Analgesic, (ABSORBINE PAIN RELIEVING EX) Apply 1 patch topically daily as  needed (pain).     morphine  (MS CONTIN ) 15 MG 12 hr tablet Take 15 mg by mouth 2 (two) times daily as needed  for pain.     OVER THE COUNTER MEDICATION Take 1 tablet by mouth daily. Focus Factor     OVER THE COUNTER MEDICATION Take 1 capsule by mouth daily. OMEGA XL     OVER THE COUNTER MEDICATION Take 1 capsule by mouth daily. Ideal Prostate     OVER THE COUNTER MEDICATION Take 1 capsule by mouth daily. Omega Plus Pro     rosuvastatin (CRESTOR) 10 MG tablet Take 10 mg by mouth every evening.     tirzepatide  (MOUNJARO ) 10 MG/0.5ML Pen Inject 10 mg into the skin every Thursday.     tiZANidine (ZANAFLEX) 2 MG tablet Take 2 mg by mouth every 8 (eight) hours as needed for muscle spasms.     traMADol  (ULTRAM ) 50 MG tablet Take 1 tablet (50 mg total) by mouth every 6 (six) hours as needed. 30 tablet 0   TURMERIC PO Take 1,500 mg by mouth daily.     No current facility-administered medications for this visit.    REVIEW OF SYSTEMS:   Constitutional: ( - ) fevers, ( - )  chills , ( - ) night sweats Eyes: ( - ) blurriness of vision, ( - ) double vision, ( - ) watery eyes Ears, nose, mouth, throat, and face: ( - ) mucositis, ( - ) sore throat Respiratory: ( - ) cough, ( - ) dyspnea, ( - ) wheezes Cardiovascular: ( - ) palpitation, ( - ) chest discomfort, ( - ) lower extremity swelling Gastrointestinal:  ( - ) nausea, ( - ) heartburn, ( - ) change in bowel habits Skin: ( - ) abnormal skin rashes Lymphatics: ( - ) new lymphadenopathy, ( - ) easy bruising Neurological: ( - ) numbness, ( - ) tingling, ( - ) new weaknesses Behavioral/Psych: ( - ) mood change, ( - ) new changes  All other systems were reviewed with the patient and are negative.  PHYSICAL EXAMINATION: ECOG PERFORMANCE STATUS: 0 - Asymptomatic  There were no vitals filed for this visit.  There were no vitals filed for this visit.   GENERAL: Well-appearing elderly African-American male, alert, no distress and comfortable SKIN: skin  color, texture, turgor are normal, no rashes or significant lesions EYES: conjunctiva are pink and non-injected, sclera clear NECK: supple, non-tender LYMPH:  no palpable lymphadenopathy in the cervical, axillary or supraclavicular regions.  LUNGS: clear to auscultation and percussion with normal breathing effort HEART: regular rate & rhythm and no murmurs and no lower extremity edema Musculoskeletal: no cyanosis of digits and no clubbing  PSYCH: alert & oriented x 3, fluent speech NEURO: no focal motor/sensory deficits  LABORATORY DATA:  I have reviewed the data as listed    Latest Ref Rng & Units 07/21/2023    7:45 AM 01/17/2023   10:31 AM 10/14/2022    8:33 AM  CBC  WBC 4.0 - 10.5 K/uL 7.3  9.2  8.7   Hemoglobin 13.0 - 17.0 g/dL 85.8  85.1  85.4   Hematocrit 39.0 - 52.0 % 42.0  44.5  44.2   Platelets 150 - 400 K/uL 157  204  218        Latest Ref Rng & Units 07/21/2023    7:45 AM 01/17/2023   10:31 AM 01/14/2023    8:42 AM  CMP  Glucose 70 - 99 mg/dL 807  776    BUN 8 - 23 mg/dL 18  11    Creatinine 9.38 - 1.24 mg/dL 9.20  9.20  9.29   Sodium  135 - 145 mmol/L 139  135    Potassium 3.5 - 5.1 mmol/L 4.2  4.0    Chloride 98 - 111 mmol/L 108  103    CO2 22 - 32 mmol/L 26  24    Calcium 8.9 - 10.3 mg/dL 8.6  9.0    Total Protein 6.5 - 8.1 g/dL 6.6  7.1    Total Bilirubin 0.0 - 1.2 mg/dL 0.4  0.3    Alkaline Phos 38 - 126 U/L 105  98    AST 15 - 41 U/L 19  17    ALT 0 - 44 U/L 22  17     RADIOGRAPHIC STUDIES: No results found.  ASSESSMENT & PLAN Max [name] is a 78 y.o. male with medical history significant for low grade lymphoma who presents for a follow up visit.  # Low Grade Lymphoma -- Core needle biopsy confirms the lymphadenopathy is an atypical lymphocyte population, most consistent with a low-grade lymphoma.  Ki-67 at 5%. -- Given the relatively benign appearance and behavior of this lymphoma would recommend continued monitoring at this time.  If there  were to be progression would consider excisional biopsy and consideration of treatment. -- Patient voiced understanding of our plan moving forward. -- Recommend repeat CT scan imaging on an as-needed basis. -- Labs today show *** -- Return to clinic in 6 months to reevaluate.  #Renal Mass --s/p L renal mass cryoablation on 09/15/2022  # Back/Side Pain *** -- The patient has some worsening of his chronic back pain as well as new pain in his left side after a fall onto the bathtub. -- Recommend Tylenol  650 mg every 6 hours or 1000 mg every 8 hours as needed -- Will prescribe tramadol  50 mg p.o. every 6 hours to help with the pain. -- Strict return precautions if pain worsens or fails to improve.  No orders of the defined types were placed in this encounter.   All questions were answered. The patient knows to call the clinic with any problems, questions or concerns.  I have spent a total of 25 minutes minutes of face-to-face and non-face-to-face time, preparing to see the patient, performing a medically appropriate examination, counseling and educating the patient, ordering medications/tests/procedures,  documenting clinical information in the electronic health record, independently interpreting results and communicating results to the patient, and care coordination.    Johnston Police PA-C Dept of Hematology and Oncology [facility] Phone: [phone]   01/19/2024 8:51 PM

## 2024-01-20 ENCOUNTER — Inpatient Hospital Stay: Payer: Medicare (Managed Care) | Admitting: Physician Assistant

## 2024-01-20 ENCOUNTER — Inpatient Hospital Stay: Payer: Medicare (Managed Care) | Attending: Hematology and Oncology

## 2024-01-20 VITALS — BP 130/84 | HR 92 | Temp 98.1°F | Resp 18 | Ht 71.0 in | Wt 206.7 lb

## 2024-01-20 DIAGNOSIS — C8593 Non-Hodgkin lymphoma, unspecified, intra-abdominal lymph nodes: Secondary | ICD-10-CM | POA: Diagnosis present

## 2024-01-20 DIAGNOSIS — C8590 Non-Hodgkin lymphoma, unspecified, unspecified site: Secondary | ICD-10-CM

## 2024-01-20 DIAGNOSIS — Z85528 Personal history of other malignant neoplasm of kidney: Secondary | ICD-10-CM | POA: Insufficient documentation

## 2024-01-20 DIAGNOSIS — M549 Dorsalgia, unspecified: Secondary | ICD-10-CM | POA: Insufficient documentation

## 2024-01-20 DIAGNOSIS — G8929 Other chronic pain: Secondary | ICD-10-CM | POA: Insufficient documentation

## 2024-01-20 DIAGNOSIS — Z803 Family history of malignant neoplasm of breast: Secondary | ICD-10-CM | POA: Insufficient documentation

## 2024-01-20 LAB — CBC WITH DIFFERENTIAL (CANCER CENTER ONLY)
Abs Immature Granulocytes: 0.03 K/uL (ref 0.00–0.07)
Basophils Absolute: 0 K/uL (ref 0.0–0.1)
Basophils Relative: 1 %
Eosinophils Absolute: 0.1 K/uL (ref 0.0–0.5)
Eosinophils Relative: 2 %
HCT: 44.7 % (ref 39.0–52.0)
Hemoglobin: 15 g/dL (ref 13.0–17.0)
Immature Granulocytes: 0 %
Lymphocytes Relative: 22 %
Lymphs Abs: 1.8 K/uL (ref 0.7–4.0)
MCH: 27.1 pg (ref 26.0–34.0)
MCHC: 33.6 g/dL (ref 30.0–36.0)
MCV: 80.8 fL (ref 80.0–100.0)
Monocytes Absolute: 0.6 K/uL (ref 0.1–1.0)
Monocytes Relative: 7 %
Neutro Abs: 5.8 K/uL (ref 1.7–7.7)
Neutrophils Relative %: 68 %
Platelet Count: 214 K/uL (ref 150–400)
RBC: 5.53 MIL/uL (ref 4.22–5.81)
RDW: 13 % (ref 11.5–15.5)
WBC Count: 8.3 K/uL (ref 4.0–10.5)
nRBC: 0 % (ref 0.0–0.2)

## 2024-01-20 LAB — CMP (CANCER CENTER ONLY)
ALT: 21 U/L (ref 0–44)
AST: 17 U/L (ref 15–41)
Albumin: 4.2 g/dL (ref 3.5–5.0)
Alkaline Phosphatase: 99 U/L (ref 38–126)
Anion gap: 7 (ref 5–15)
BUN: 21 mg/dL (ref 8–23)
CO2: 26 mmol/L (ref 22–32)
Calcium: 9.3 mg/dL (ref 8.9–10.3)
Chloride: 104 mmol/L (ref 98–111)
Creatinine: 0.88 mg/dL (ref 0.61–1.24)
GFR, Estimated: >60 mL/min (ref >60)
Glucose, Bld: 255 mg/dL — ABNORMAL HIGH (ref 70–99)
Potassium: 4.3 mmol/L (ref 3.5–5.1)
Sodium: 137 mmol/L (ref 135–145)
Total Bilirubin: 0.4 mg/dL (ref 0.0–1.2)
Total Protein: 7.2 g/dL (ref 6.5–8.1)

## 2024-01-20 LAB — LACTATE DEHYDROGENASE: LDH: 142 U/L (ref 98–192)

## 2024-01-23 ENCOUNTER — Other Ambulatory Visit: Payer: Self-pay | Admitting: Physician Assistant

## 2024-01-23 DIAGNOSIS — N2889 Other specified disorders of kidney and ureter: Secondary | ICD-10-CM

## 2024-01-25 ENCOUNTER — Inpatient Hospital Stay: Admission: RE | Admit: 2024-01-25 | Source: Ambulatory Visit

## 2024-01-26 DIAGNOSIS — M47816 Spondylosis without myelopathy or radiculopathy, lumbar region: Secondary | ICD-10-CM | POA: Diagnosis not present

## 2024-01-26 DIAGNOSIS — G894 Chronic pain syndrome: Secondary | ICD-10-CM | POA: Diagnosis not present

## 2024-01-30 ENCOUNTER — Ambulatory Visit (HOSPITAL_COMMUNITY)
Admission: RE | Admit: 2024-01-30 | Discharge: 2024-01-30 | Disposition: A | Discharge status: Home / Self Care | Source: Ambulatory Visit | Attending: Physician Assistant | Admitting: Physician Assistant

## 2024-01-30 DIAGNOSIS — Z48816 Encounter for surgical aftercare following surgery on the genitourinary system: Secondary | ICD-10-CM | POA: Insufficient documentation

## 2024-01-30 DIAGNOSIS — R590 Localized enlarged lymph nodes: Secondary | ICD-10-CM | POA: Diagnosis not present

## 2024-01-30 DIAGNOSIS — N2889 Other specified disorders of kidney and ureter: Secondary | ICD-10-CM | POA: Insufficient documentation

## 2024-01-30 MED ORDER — IOHEXOL 300 MG/ML  SOLN
100.0000 mL | Freq: Once | INTRAMUSCULAR | Status: AC | PRN
  Administered 2024-01-30: 100 mL via INTRAVENOUS

## 2024-02-09 ENCOUNTER — Telehealth: Payer: Self-pay | Admitting: Physician Assistant

## 2024-02-09 NOTE — Telephone Encounter (Signed)
 I called [name] to review the CT scan results from 01/30/2024. Findings show regression of post ablation changes of the lower pole left renal mass, decreased in size to approximately 2.9 x 2.7 cm from 3.7 x 3.3 cm, without suspicious enhancement to suggest residual or recurrent tumor. In additional retroperitoneal lymph nodes are stable in size.   No further intervention is needed and patient will continue on surveillance. [name] expressed understanding and satisfaction with the plan provided.

## 2024-07-20 ENCOUNTER — Inpatient Hospital Stay: Admitting: Hematology and Oncology

## 2024-07-20 ENCOUNTER — Inpatient Hospital Stay
